# Patient Record
Sex: Female | Born: 1970
Health system: Southern US, Community
[De-identification: ages and names within clinical notes are randomized; demographics above are authoritative.]

## PROBLEM LIST (undated history)

## (undated) DIAGNOSIS — Z9889 Other specified postprocedural states: Secondary | ICD-10-CM

## (undated) DIAGNOSIS — Z86010 Personal history of colonic polyps: Principal | ICD-10-CM

## (undated) DIAGNOSIS — E785 Hyperlipidemia, unspecified: Secondary | ICD-10-CM

## (undated) DIAGNOSIS — M810 Age-related osteoporosis without current pathological fracture: Secondary | ICD-10-CM

## (undated) DIAGNOSIS — Z5189 Encounter for other specified aftercare: Secondary | ICD-10-CM

## (undated) DIAGNOSIS — D649 Anemia, unspecified: Secondary | ICD-10-CM

## (undated) DIAGNOSIS — M4854XS Collapsed vertebra, not elsewhere classified, thoracic region, sequela of fracture: Secondary | ICD-10-CM

## (undated) DIAGNOSIS — T8859XA Other complications of anesthesia, initial encounter: Secondary | ICD-10-CM

## (undated) DIAGNOSIS — F419 Anxiety disorder, unspecified: Secondary | ICD-10-CM

## (undated) DIAGNOSIS — N814 Uterovaginal prolapse, unspecified: Secondary | ICD-10-CM

## (undated) DIAGNOSIS — M419 Scoliosis, unspecified: Secondary | ICD-10-CM

## (undated) DIAGNOSIS — G4730 Sleep apnea, unspecified: Secondary | ICD-10-CM

## (undated) DIAGNOSIS — M1990 Unspecified osteoarthritis, unspecified site: Secondary | ICD-10-CM

## (undated) DIAGNOSIS — R1310 Dysphagia, unspecified: Secondary | ICD-10-CM

## (undated) DIAGNOSIS — R112 Nausea with vomiting, unspecified: Secondary | ICD-10-CM

## (undated) DIAGNOSIS — T7840XA Allergy, unspecified, initial encounter: Secondary | ICD-10-CM

## (undated) HISTORY — DX: Collapsed vertebra, not elsewhere classified, thoracic region, sequela of fracture: M48.54XS

## (undated) HISTORY — PX: PARTIAL HYSTERECTOMY: SHX80

## (undated) HISTORY — DX: Uterovaginal prolapse, unspecified: N81.4

## (undated) HISTORY — DX: Unspecified osteoarthritis, unspecified site: M19.90

## (undated) HISTORY — DX: Anxiety disorder, unspecified: F41.9

## (undated) HISTORY — PX: ABDOMINAL HYSTERECTOMY: SHX81

## (undated) HISTORY — DX: Sleep apnea, unspecified: G47.30

## (undated) HISTORY — PX: ESOPHAGOGASTRODUODENOSCOPY: SHX1529

## (undated) HISTORY — DX: Personal history of colonic polyps: Z86.010

## (undated) HISTORY — DX: Hyperlipidemia, unspecified: E78.5

## (undated) HISTORY — PX: OTHER SURGICAL HISTORY: SHX169

## (undated) HISTORY — DX: Scoliosis, unspecified: M41.9

## (undated) HISTORY — DX: Allergy, unspecified, initial encounter: T78.40XA

## (undated) HISTORY — DX: Age-related osteoporosis without current pathological fracture: M81.0

## (undated) HISTORY — DX: Dysphagia, unspecified: R13.10

## (undated) HISTORY — PX: COLONOSCOPY: SHX174

## (undated) HISTORY — DX: Encounter for other specified aftercare: Z51.89

---

## 2003-10-03 ENCOUNTER — Emergency Department (HOSPITAL_COMMUNITY): Admission: EM | Admit: 2003-10-03 | Discharge: 2003-10-03 | Payer: Self-pay | Admitting: Family Medicine

## 2013-06-28 DIAGNOSIS — Z860100 Personal history of colon polyps, unspecified: Secondary | ICD-10-CM

## 2013-06-28 HISTORY — DX: Personal history of colonic polyps: Z86.010

## 2013-06-28 HISTORY — DX: Personal history of colon polyps, unspecified: Z86.0100

## 2018-03-25 DIAGNOSIS — N951 Menopausal and female climacteric states: Secondary | ICD-10-CM | POA: Insufficient documentation

## 2018-03-25 DIAGNOSIS — G8929 Other chronic pain: Secondary | ICD-10-CM | POA: Diagnosis not present

## 2018-03-25 DIAGNOSIS — Z205 Contact with and (suspected) exposure to viral hepatitis: Secondary | ICD-10-CM | POA: Diagnosis not present

## 2018-03-25 DIAGNOSIS — N644 Mastodynia: Secondary | ICD-10-CM | POA: Diagnosis not present

## 2018-03-25 DIAGNOSIS — R351 Nocturia: Secondary | ICD-10-CM | POA: Diagnosis not present

## 2018-03-25 DIAGNOSIS — R1032 Left lower quadrant pain: Secondary | ICD-10-CM | POA: Diagnosis not present

## 2018-03-25 DIAGNOSIS — Z1159 Encounter for screening for other viral diseases: Secondary | ICD-10-CM | POA: Diagnosis not present

## 2018-03-25 DIAGNOSIS — R52 Pain, unspecified: Secondary | ICD-10-CM | POA: Insufficient documentation

## 2018-04-23 ENCOUNTER — Ambulatory Visit (INDEPENDENT_AMBULATORY_CARE_PROVIDER_SITE_OTHER): Payer: BLUE CROSS/BLUE SHIELD | Admitting: Family Medicine

## 2018-04-23 ENCOUNTER — Encounter: Payer: Self-pay | Admitting: Family Medicine

## 2018-04-23 VITALS — BP 118/56 | HR 86 | Temp 98.0°F | Ht 69.0 in | Wt 162.8 lb

## 2018-04-23 DIAGNOSIS — F419 Anxiety disorder, unspecified: Secondary | ICD-10-CM

## 2018-04-23 DIAGNOSIS — Z1211 Encounter for screening for malignant neoplasm of colon: Secondary | ICD-10-CM

## 2018-04-23 DIAGNOSIS — R7301 Impaired fasting glucose: Secondary | ICD-10-CM | POA: Diagnosis not present

## 2018-04-23 DIAGNOSIS — Z8739 Personal history of other diseases of the musculoskeletal system and connective tissue: Secondary | ICD-10-CM | POA: Diagnosis not present

## 2018-04-23 DIAGNOSIS — R079 Chest pain, unspecified: Secondary | ICD-10-CM

## 2018-04-23 DIAGNOSIS — Z1283 Encounter for screening for malignant neoplasm of skin: Secondary | ICD-10-CM

## 2018-04-23 DIAGNOSIS — R109 Unspecified abdominal pain: Secondary | ICD-10-CM

## 2018-04-23 DIAGNOSIS — Z7689 Persons encountering health services in other specified circumstances: Secondary | ICD-10-CM | POA: Diagnosis not present

## 2018-04-23 DIAGNOSIS — M7918 Myalgia, other site: Secondary | ICD-10-CM

## 2018-04-23 LAB — HEPATIC FUNCTION PANEL
ALT: 14 U/L (ref 0–35)
AST: 16 U/L (ref 0–37)
Albumin: 4.4 g/dL (ref 3.5–5.2)
Alkaline Phosphatase: 85 U/L (ref 39–117)
Bilirubin, Direct: 0 mg/dL (ref 0.0–0.3)
Total Bilirubin: 0.5 mg/dL (ref 0.2–1.2)
Total Protein: 7.5 g/dL (ref 6.0–8.3)

## 2018-04-23 LAB — LIPID PANEL
CHOL/HDL RATIO: 7
Cholesterol: 305 mg/dL — ABNORMAL HIGH (ref 0–200)
HDL: 41.5 mg/dL (ref >39.00)
Triglycerides: 419 mg/dL — ABNORMAL HIGH (ref 0.0–149.0)

## 2018-04-23 LAB — LIPASE: Lipase: 7 U/L — ABNORMAL LOW (ref 11.0–59.0)

## 2018-04-23 LAB — LDL CHOLESTEROL, DIRECT: Direct LDL: 195 mg/dL

## 2018-04-23 MED ORDER — ESCITALOPRAM OXALATE 10 MG PO TABS: 10.0000 mg | ORAL_TABLET | Freq: Every day | ORAL | 3 refills | Status: DC

## 2018-04-23 MED ORDER — CYCLOBENZAPRINE HCL 10 MG PO TABS: 10.0000 mg | ORAL_TABLET | Freq: Three times a day (TID) | ORAL | 0 refills | Status: DC | PRN

## 2018-04-23 NOTE — Progress Notes (Signed)
Roberta Sims is a 48 y.o. female  Chief Complaint  Patient presents with  . Establish Care    est care/ prediabetes concer?/ left side pain happened saturday scoliosis at 15 harrington rod-- fasting/ letting me know last tdap/ wants to wait to get Flu shot    HPI: Roberta Sims is a 48 y.o. female here to establish care with our office. She is from here originally but lived in [place] for 20+ years and moved back area 1.5 years ago. She has grown children still living in [place]. She is concerned about "pre-diabetes" based on labs done in 02/2018. Fasting BS in 02/2018 = 94 and A1C = 6.2.  Specialists: Dr. Danton Sewer (GYN), she has appt with neurologist but not until 07/30/2018 and would   Last PAP: 2015 and normal as per pt - pt is s/p partial hysterectomy; pt has appt for annual exam in 05/2018. Last mammo: has upcoming mammo and [place] Pt had colonoscopy and EGD in 06/2013. Repeat EGD recommended in 3 years d/t polyps (number and size) and colo in 5 years. She needs a referral for EGD/colonsocopy. + fam h/o CRC - mother Med refills needed today: n/a  She requests referral to derm for routine skin cancer screening.  She is fasting and would like lipid panel checked along with liver function and pancreas.   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 She felt it was very helpful despite initially being resistant to taking med. She states for the past few months she has started to feel similar "anxiety" symptoms - mind racing, trouble falling asleep or getting back to sleep. She would like to restart lexapro.   Pt notes a h/o TMJ and saw sleep med and was diagnosed with mild sleep apnea. She has CPAP but hasn't used in sometime. She may need new tubing but otherwise is set. She notes feeling "run down" and like she needs some "sleep in her body"  Past Medical History:  Diagnosis Date  . Scoliosis     Past  Surgical History:  Procedure Laterality Date  . harrington rod--scoliosis      Social History   Socioeconomic History  . Marital status: Single    Spouse name: Not on file  . Number of children: Not on file  . Years of education: Not on file  . Highest education level: Not on file  Occupational History  . Not on file  Social Needs  . Financial resource strain: Not on file  . Food insecurity:    Worry: Not on file    Inability: Not on file  . Transportation needs:    Medical: Not on file    Non-medical: Not on file  Tobacco Use  . Smoking status: Never Smoker  . Smokeless tobacco: Never Used  Substance and Sexual Activity  . Alcohol use: Yes    Comment: socially  . Drug use: Never  . Sexual activity: Not on file  Lifestyle  . Physical activity:    Days per week: Not on file    Minutes per session: Not on file  . Stress: Not on file  Relationships  . Social connections:    Talks on phone: Not on file    Gets together: Not on file    Attends religious service: Not on file    Active member of club or organization: Not on file  Attends meetings of clubs or organizations: Not on file    Relationship status: Not on file  . Intimate partner violence:    Fear of current or ex partner: Not on file    Emotionally abused: Not on file    Physically abused: Not on file    Forced sexual activity: Not on file  Other Topics Concern  . Not on file  Social History Narrative  . Not on file    Family History  Problem Relation Age of Onset  . Pancreatic cancer Mother   . Colon cancer Mother   . Alcohol abuse Brother   . Diabetes Brother   . Colon cancer Maternal Grandmother       There is no immunization history on file for this patient.  Outpatient Encounter Medications as of 04/23/2018  Medication Sig  . cyclobenzaprine (FLEXERIL) 10 MG tablet Take 1 tablet (10 mg total) by mouth 3 (three) times daily as needed for muscle spasms.  Marland Kitchen escitalopram (LEXAPRO) 10 MG  tablet Take 1 tablet (10 mg total) by mouth daily.  . pregabalin (LYRICA) 25 MG capsule Take by mouth.   No facility-administered encounter medications on file as of 04/23/2018.      ROS: Pertinent positives and negatives noted in HPI. Remainder of ROS non-contributory   No Known Allergies  BP (!) 118/56   Pulse 86   Temp 98 F (36.7 C) (Oral)   Ht  (1.753 m)   Wt 162 lb 12.8 oz (73.8 kg)   SpO2 95%   BMI 24.04 kg/m   Physical Exam  Constitutional: She is oriented to person, place, and time. She appears well-developed and well-nourished. No distress.  Neck: No thyromegaly present.  Cardiovascular: Normal rate and regular rhythm.  Pulmonary/Chest: Effort normal and breath sounds normal. No respiratory distress.  Abdominal: Soft. Bowel sounds are normal. She exhibits no distension (Lt upper lateral abdomen/side TTP) and no mass. There is abdominal tenderness. There is no rebound and no guarding.  Musculoskeletal:        General: No edema.  Lymphadenopathy:    She has no cervical adenopathy.  Neurological: She is alert and oriented to person, place, and time.  Psychiatric: She has a normal mood and affect. Her behavior is normal.     A/P:  1. Encounter to establish care with new doctor  2. Impaired fasting glucose - A1C = 6.2 in 1/20 - pt does endorse gaining 15+ lbs in past 1.5 years and some mild-moderate alcohol intake (3-4 glasses per week). Pt plans to increase exercise and improve diet over next 6 mo and will recheck A1C in 6 mo at CPE appt - Lipid panel  3. H/O scoliosis 4. Left-sided chest pain - chronic, extensive work-up done. Pt was told to see neuro to see if rod in in spine for scoliosis is causing/contributing to this - Ambulatory referral to Neurology  5. Left sided abdominal pain - chronic, extensive work-up done. Pt was told to see neuro to see if rod in in spine for scoliosis is causing/contributing to this - Ambulatory referral to Neurology -  Hepatic function panel - Lipase  6. Screening for colon cancer - Ambulatory referral to Gastroenterology  7. Screening for skin cancer - Ambulatory referral to Dermatology  8. Anxiety - h/o anxiety and previously treated in 2014, off meds and symptoms controlled x years but uncontrolled x 5-6 mo Rx: - escitalopram (LEXAPRO) 10 MG tablet; Take 1 tablet (10 mg total) by mouth daily.  Dispense: 90 tablet; Refill: 3 - f/u in 3-4 wks or sooner PRN  9. Musculoskeletal pain - left side x 1 week after bending forward to pick something up off floor - ibuprofen 600-800mg  BID w food x 7 days - heating pad BID - ok to take flexeril 5-10mg  qHS PRN  - f/u PRN   I spent 50 min of face-to-face timewith the patient today and greater than 50% was spent in counseling, coordination of care, education regarding anxiety, IFG,and preventative screening measures

## 2018-04-24 ENCOUNTER — Other Ambulatory Visit: Payer: Self-pay | Admitting: Family Medicine

## 2018-04-24 ENCOUNTER — Encounter: Payer: Self-pay | Admitting: Family Medicine

## 2018-04-24 DIAGNOSIS — E7849 Other hyperlipidemia: Secondary | ICD-10-CM | POA: Insufficient documentation

## 2018-04-24 DIAGNOSIS — E781 Pure hyperglyceridemia: Secondary | ICD-10-CM | POA: Insufficient documentation

## 2018-04-24 MED ORDER — FENOFIBRATE 145 MG PO TABS: 145.0000 mg | ORAL_TABLET | Freq: Every day | ORAL | 3 refills | Status: DC

## 2018-04-27 DIAGNOSIS — L608 Other nail disorders: Secondary | ICD-10-CM | POA: Diagnosis not present

## 2018-04-27 DIAGNOSIS — L570 Actinic keratosis: Secondary | ICD-10-CM | POA: Diagnosis not present

## 2018-04-27 DIAGNOSIS — L814 Other melanin hyperpigmentation: Secondary | ICD-10-CM | POA: Diagnosis not present

## 2018-04-27 DIAGNOSIS — L568 Other specified acute skin changes due to ultraviolet radiation: Secondary | ICD-10-CM | POA: Diagnosis not present

## 2018-05-05 DIAGNOSIS — N644 Mastodynia: Secondary | ICD-10-CM | POA: Diagnosis not present

## 2018-05-05 DIAGNOSIS — R928 Other abnormal and inconclusive findings on diagnostic imaging of breast: Secondary | ICD-10-CM | POA: Diagnosis not present

## 2018-05-05 DIAGNOSIS — N6320 Unspecified lump in the left breast, unspecified quadrant: Secondary | ICD-10-CM | POA: Diagnosis not present

## 2018-06-08 ENCOUNTER — Telehealth: Payer: Self-pay | Admitting: Internal Medicine

## 2018-06-08 DIAGNOSIS — Z86010 Personal history of colonic polyps: Secondary | ICD-10-CM

## 2018-06-08 NOTE — Telephone Encounter (Signed)
DOD April 23, 2018: Dr. Leone Payor, EGD and colonoscopy records from May 2015 were received from [facility] in [place], [place].  Pt is due for a 5-year recall.   Records will be placed on your desk for review.

## 2018-06-16 ENCOUNTER — Encounter: Payer: Self-pay | Admitting: Internal Medicine

## 2018-06-16 NOTE — Telephone Encounter (Signed)
Records review - history of sessile serrated colon polyp so repeat colonoscopy 5-10 years after makes sense 5 years is our current standard and recommend she be scheduled when we are able to start scheduling patinets for surveillance colonoscopy - not urgent as just at 5 years in 06/2018

## 2018-06-30 ENCOUNTER — Other Ambulatory Visit: Payer: Self-pay | Admitting: Family Medicine

## 2018-06-30 NOTE — Telephone Encounter (Signed)
Dr. Salena Saner please advise. Pt last OV and last refill 2/27 next OV scheduled for 7/29

## 2018-07-30 DIAGNOSIS — R52 Pain, unspecified: Secondary | ICD-10-CM | POA: Diagnosis not present

## 2018-07-30 DIAGNOSIS — M41125 Adolescent idiopathic scoliosis, thoracolumbar region: Secondary | ICD-10-CM | POA: Diagnosis not present

## 2018-08-12 DIAGNOSIS — R52 Pain, unspecified: Secondary | ICD-10-CM | POA: Diagnosis not present

## 2018-08-12 DIAGNOSIS — M47812 Spondylosis without myelopathy or radiculopathy, cervical region: Secondary | ICD-10-CM | POA: Diagnosis not present

## 2018-08-12 DIAGNOSIS — M47816 Spondylosis without myelopathy or radiculopathy, lumbar region: Secondary | ICD-10-CM | POA: Diagnosis not present

## 2018-08-12 DIAGNOSIS — M50222 Other cervical disc displacement at C5-C6 level: Secondary | ICD-10-CM | POA: Diagnosis not present

## 2018-08-12 DIAGNOSIS — M4802 Spinal stenosis, cervical region: Secondary | ICD-10-CM | POA: Diagnosis not present

## 2018-08-12 DIAGNOSIS — M47814 Spondylosis without myelopathy or radiculopathy, thoracic region: Secondary | ICD-10-CM | POA: Diagnosis not present

## 2018-08-24 DIAGNOSIS — N951 Menopausal and female climacteric states: Secondary | ICD-10-CM | POA: Diagnosis not present

## 2018-08-24 DIAGNOSIS — R52 Pain, unspecified: Secondary | ICD-10-CM | POA: Diagnosis not present

## 2018-08-24 DIAGNOSIS — Z205 Contact with and (suspected) exposure to viral hepatitis: Secondary | ICD-10-CM | POA: Diagnosis not present

## 2018-08-27 ENCOUNTER — Other Ambulatory Visit: Payer: Self-pay

## 2018-08-27 ENCOUNTER — Encounter: Payer: Self-pay | Admitting: Nurse Practitioner

## 2018-08-27 ENCOUNTER — Ambulatory Visit (INDEPENDENT_AMBULATORY_CARE_PROVIDER_SITE_OTHER): Payer: BC Managed Care – PPO | Admitting: Nurse Practitioner

## 2018-08-27 VITALS — Ht 69.0 in | Wt 156.0 lb

## 2018-08-27 DIAGNOSIS — J0100 Acute maxillary sinusitis, unspecified: Secondary | ICD-10-CM

## 2018-08-27 MED ORDER — MOMETASONE FUROATE 50 MCG/ACT NA SUSP: 2.0000 | Freq: Every day | NASAL | 0 refills | Status: DC

## 2018-08-27 MED ORDER — GUAIFENESIN ER 600 MG PO TB12: 600.0000 mg | ORAL_TABLET | Freq: Two times a day (BID) | ORAL | 0 refills | Status: DC | PRN

## 2018-08-27 MED ORDER — SALINE SPRAY 0.65 % NA SOLN: 1.0000 | NASAL | 0 refills | Status: DC | PRN

## 2018-08-27 MED ORDER — AMOXICILLIN 875 MG PO TABS: 875.0000 mg | ORAL_TABLET | Freq: Two times a day (BID) | ORAL | 0 refills | Status: DC

## 2018-08-27 NOTE — Patient Instructions (Signed)
Call office if no improvement in 5days.  Start oral abx, nasocort and mucinex Continue saline sinus rinse once a day.

## 2018-08-27 NOTE — Progress Notes (Signed)
Interactive audio and video telecommunications were attempted between this provider and patient, however failed, due to patient having technical difficulties OR patient did not have access to video capability.  We continued and completed visit with audio only.   Virtual Visit via Video Note  I connected with Roberta Sims on 08/27/18 at  8:15 AM EDT by a video enabled telemedicine application and verified that I am speaking with the correct person using two identifiers.  Location: Patient: Home Provider: office   I discussed the limitations of evaluation and management by telemedicine and the availability of in person appointments. The patient expressed understanding and agreed to proceed.  CC: pt is c/o of sinus pressure--headd hurt,jaw painful/1 wk/ tied zytect and sinus rinse/ hx of sinus allergy before.   History of Present Illness: Sinusitis This is a new problem. The current episode started 1 to 4 weeks ago. The problem has been gradually worsening since onset. There has been no fever. Associated symptoms include congestion, headaches and sinus pressure. Pertinent negatives include no chills, coughing, diaphoresis, ear pain, hoarse voice, neck pain, shortness of breath, sneezing, sore throat or swollen glands. Past treatments include saline sprays (and zyrtec). The treatment provided no relief.  negative COVID screen. No sick contact at home, she works from home, she observe social distacing and facial covering guidelines.  Observations/Objective: Alert and oriented, normal voice and speech.  Assessment and Plan: Roberta Sims was seen today for sinusitis.  Diagnoses and all orders for this visit:  Acute non-recurrent maxillary sinusitis -     amoxicillin (AMOXIL) 875 MG tablet; Take 1 tablet (875 mg total) by mouth 2 (two) times daily. -     mometasone (NASONEX) 50 MCG/ACT nasal spray; Place 2 sprays into the nose daily. -     guaiFENesin (MUCINEX) 600 MG 12 hr tablet; Take 1 tablet  (600 mg total) by mouth 2 (two) times daily as needed for cough or to loosen phlegm. -     sodium chloride (OCEAN) 0.65 % SOLN nasal spray; Place 1 spray into both nostrils as needed for congestion.   Follow Up Instructions: See discharge instruction   I discussed the assessment and treatment plan with the patient. The patient was provided an opportunity to ask questions and all were answered. The patient agreed with the plan and demonstrated an understanding of the instructions.   The patient was advised to call back or seek an in-person evaluation if the symptoms worsen or if the condition fails to improve as anticipated.  I provided 14 minutes of non-face-to-face time during this encounter.   Wilfred Lacy, NP

## 2018-09-01 NOTE — Telephone Encounter (Signed)
Left message to call back to schedule direct colon.  

## 2018-09-11 ENCOUNTER — Other Ambulatory Visit: Payer: Self-pay

## 2018-09-11 ENCOUNTER — Encounter: Payer: Self-pay | Admitting: Family Medicine

## 2018-09-11 ENCOUNTER — Ambulatory Visit (INDEPENDENT_AMBULATORY_CARE_PROVIDER_SITE_OTHER): Payer: BC Managed Care – PPO | Admitting: Family Medicine

## 2018-09-11 VITALS — BP 130/90 | HR 88 | Temp 98.1°F

## 2018-09-11 DIAGNOSIS — R6883 Chills (without fever): Secondary | ICD-10-CM | POA: Diagnosis not present

## 2018-09-11 DIAGNOSIS — R6889 Other general symptoms and signs: Secondary | ICD-10-CM

## 2018-09-11 DIAGNOSIS — Z20822 Contact with and (suspected) exposure to covid-19: Secondary | ICD-10-CM

## 2018-09-11 DIAGNOSIS — R3915 Urgency of urination: Secondary | ICD-10-CM | POA: Diagnosis not present

## 2018-09-11 DIAGNOSIS — R195 Other fecal abnormalities: Secondary | ICD-10-CM

## 2018-09-11 DIAGNOSIS — R110 Nausea: Secondary | ICD-10-CM

## 2018-09-11 LAB — POCT URINALYSIS DIPSTICK
Bilirubin, UA: NEGATIVE
Blood, UA: NEGATIVE
Glucose, UA: NEGATIVE
Ketones, UA: NEGATIVE
Leukocytes, UA: NEGATIVE
Nitrite, UA: NEGATIVE
Protein, UA: POSITIVE — AB
Spec Grav, UA: 1.015 (ref 1.010–1.025)
Urobilinogen, UA: 1 E.U./dL
pH, UA: 8.5 — AB (ref 5.0–8.0)

## 2018-09-11 MED ORDER — ONDANSETRON 4 MG PO TBDP: 4.0000 mg | ORAL_TABLET | Freq: Three times a day (TID) | ORAL | 0 refills | Status: DC | PRN

## 2018-09-11 NOTE — Progress Notes (Signed)
Roberta Sims is a 48 y.o. female  Chief Complaint  Patient presents with   Urinary Frequency    frequent urination/burning with urination/lower left side flank pain/ 7 days    HPI: Roberta Sims is a 48 y.o. female complains of 7 day h/o urinary frequency, Lt lower back pain. She has a sensation of incomplete emptying. Mild dysuria x 1 episodes this afternoon. + muscle aches. + nausea, no vomiting x today. + chills today. +  Increased reflux. She endorses loose stools and at times diarrhea x 7 days.  No gross hematuria. No fever. No cough, SOB, sore throat, runny nose, nasal congestion. She has has been drinking more water.  Partner is in mid 29's. He is supposed to be going to [place] to care for his sister who just underwent TAH for ovarian cancer.  Past Medical History:  Diagnosis Date   Hx of colonic polyp 06/28/2013   Scoliosis     Past Surgical History:  Procedure Laterality Date   harrington rod--scoliosis      Social History   Socioeconomic History   Marital status: Single    Spouse name: Not on file   Number of children: Not on file   Years of education: Not on file   Highest education level: Not on file  Occupational History   Not on file  Social Needs   Financial resource strain: Not on file   Food insecurity    Worry: Not on file    Inability: Not on file   Transportation needs    Medical: Not on file    Non-medical: Not on file  Tobacco Use   Smoking status: Never Smoker   Smokeless tobacco: Never Used  Substance and Sexual Activity   Alcohol use: Yes    Comment: socially   Drug use: Never   Sexual activity: Not on file  Lifestyle   Physical activity    Days per week: Not on file    Minutes per session: Not on file   Stress: Not on file  Relationships   Social connections    Talks on phone: Not on file    Gets together: Not on file    Attends religious service: Not on file    Active member of club or organization: Not on file      Attends meetings of clubs or organizations: Not on file    Relationship status: Not on file   Intimate partner violence    Fear of current or ex partner: Not on file    Emotionally abused: Not on file    Physically abused: Not on file    Forced sexual activity: Not on file  Other Topics Concern   Not on file  Social History Narrative   Not on file    Family History  Problem Relation Age of Onset   Pancreatic cancer Mother    Colon cancer Mother    Alcohol abuse Brother    Diabetes Brother    Colon cancer Maternal Grandmother       There is no immunization history on file for this patient.  Outpatient Encounter Medications as of 09/11/2018  Medication Sig   Cetirizine HCl (ZYRTEC PO) Take by mouth.   cyclobenzaprine (FLEXERIL) 10 MG tablet TAKE ONE TABLET BY MOUTH THREE TIMES A DAY AS NEEDED FOR MUSCLE SPASM   escitalopram (LEXAPRO) 10 MG tablet Take 1 tablet (10 mg total) by mouth daily.   fenofibrate (TRICOR) 145 MG tablet Take 1 tablet (145 mg  total) by mouth daily.   sodium chloride (OCEAN) 0.65 % SOLN nasal spray Place 1 spray into both nostrils as needed for congestion.   guaiFENesin (MUCINEX) 600 MG 12 hr tablet Take 1 tablet (600 mg total) by mouth 2 (two) times daily as needed for cough or to loosen phlegm. (Patient not taking: Reported on 09/11/2018)   mometasone (NASONEX) 50 MCG/ACT nasal spray Place 2 sprays into the nose daily. (Patient not taking: Reported on 09/11/2018)   pregabalin (LYRICA) 25 MG capsule Take by mouth.   [DISCONTINUED] amoxicillin (AMOXIL) 875 MG tablet Take 1 tablet (875 mg total) by mouth 2 (two) times daily.   No facility-administered encounter medications on file as of 09/11/2018.      ROS: Pertinent positives and negatives noted in HPI. Remainder of ROS non-contributory    No Known Allergies  BP 130/90    Pulse 88    Temp 98.1 F (36.7 C) (Oral)    SpO2 98%   Physical Exam  Constitutional: She is oriented to  person, place, and time. She appears well-developed and well-nourished.  Neck: Neck supple.  Cardiovascular: Normal rate, regular rhythm and normal heart sounds.  Pulmonary/Chest: Effort normal and breath sounds normal. No respiratory distress. She has no wheezes. She has no rhonchi.  Abdominal: Soft. Bowel sounds are increased. There is no abdominal tenderness. There is no CVA tenderness.  Musculoskeletal:        General: Tenderness (B/L lumbar TTP but no CVA TTP) present. No edema.  Lymphadenopathy:    She has no cervical adenopathy.  Neurological: She is alert and oriented to person, place, and time.  Skin: Skin is warm and dry. No rash noted.    Component     Latest Ref Rng & Units 09/11/2018  Color, UA      yellow  Clarity, UA      clear  Glucose     Negative Negative  Bilirubin, UA      neg  Ketones, UA      neg  Specific Gravity, UA     1.010 - 1.025 1.015  RBC, UA      neg  pH, UA     5.0 - 8.0 8.5 (A)  Protein,UA     Negative Positive (A)  Urobilinogen, UA     0.2 or 1.0 E.U./dL 1.0  Nitrite, UA      neg  Leukocytes,UA     Negative Negative     A/P:   1. Urgency of urination - POCT Urinalysis Dipstick - neg for leuk, nitrite, blood - no CVA TTP  2. Nausea 3. Chills 4. Loose stools 5. Suspected Covid-19 Virus Infection - doubt UTI, pyelonephritis  - could be viral gastroenteritis but symptoms also concerning for COVID Rx: - ondansetron (ZOFRAN ODT) 4 MG disintegrating tablet; Take 1 tablet (4 mg total) by mouth every 8 (eight) hours as needed for nausea or vomiting.  Dispense: 20 tablet; Refill: 0 - Novel Coronavirus, NAA ([facility]) - MYCHART COVID-19 HOME MONITORING PROGRAM - pt will quarantine at home and understands guidelines - increased fluid intake, rest, tylenol PRN, bland diet Discussed plan and reviewed medications with patient, including risks, benefits, and potential side effects. Pt expressed understand. All questions answered.

## 2018-09-11 NOTE — Patient Instructions (Addendum)
[place]  8am COVID testing site

## 2018-09-14 ENCOUNTER — Other Ambulatory Visit: Payer: Self-pay | Admitting: Family Medicine

## 2018-09-14 DIAGNOSIS — Z20822 Contact with and (suspected) exposure to covid-19: Secondary | ICD-10-CM

## 2018-09-14 DIAGNOSIS — R6889 Other general symptoms and signs: Secondary | ICD-10-CM | POA: Diagnosis not present

## 2018-09-16 ENCOUNTER — Encounter: Payer: Self-pay | Admitting: Family Medicine

## 2018-09-17 LAB — NOVEL CORONAVIRUS, NAA: SARS-CoV-2, NAA: NOT DETECTED

## 2018-09-21 ENCOUNTER — Telehealth: Payer: Self-pay | Admitting: Family Medicine

## 2018-09-21 NOTE — Telephone Encounter (Signed)
Pt advised of negative results w/t understanding

## 2018-09-23 ENCOUNTER — Ambulatory Visit: Payer: BLUE CROSS/BLUE SHIELD | Admitting: Family Medicine

## 2018-10-29 ENCOUNTER — Encounter: Payer: Self-pay | Admitting: Family Medicine

## 2018-10-29 ENCOUNTER — Ambulatory Visit: Payer: BC Managed Care – PPO | Admitting: Family Medicine

## 2018-10-29 ENCOUNTER — Ambulatory Visit (INDEPENDENT_AMBULATORY_CARE_PROVIDER_SITE_OTHER): Payer: BC Managed Care – PPO | Admitting: Family Medicine

## 2018-10-29 VITALS — BP 112/80 | HR 80 | Temp 98.1°F | Ht 69.0 in | Wt 158.2 lb

## 2018-10-29 DIAGNOSIS — Z0000 Encounter for general adult medical examination without abnormal findings: Secondary | ICD-10-CM

## 2018-10-29 DIAGNOSIS — Z23 Encounter for immunization: Secondary | ICD-10-CM | POA: Diagnosis not present

## 2018-10-29 DIAGNOSIS — G8929 Other chronic pain: Secondary | ICD-10-CM

## 2018-10-29 DIAGNOSIS — F419 Anxiety disorder, unspecified: Secondary | ICD-10-CM | POA: Diagnosis not present

## 2018-10-29 DIAGNOSIS — E781 Pure hyperglyceridemia: Secondary | ICD-10-CM

## 2018-10-29 DIAGNOSIS — M25562 Pain in left knee: Secondary | ICD-10-CM

## 2018-10-29 DIAGNOSIS — Z2821 Immunization not carried out because of patient refusal: Secondary | ICD-10-CM

## 2018-10-29 DIAGNOSIS — R7301 Impaired fasting glucose: Secondary | ICD-10-CM | POA: Diagnosis not present

## 2018-10-29 DIAGNOSIS — M25561 Pain in right knee: Secondary | ICD-10-CM

## 2018-10-29 LAB — LDL CHOLESTEROL, DIRECT: Direct LDL: 215 mg/dL

## 2018-10-29 LAB — LIPID PANEL
Cholesterol: 281 mg/dL — ABNORMAL HIGH (ref 0–200)
HDL: 44.2 mg/dL (ref >39.00)
NonHDL: 236.3
Total CHOL/HDL Ratio: 6
Triglycerides: 243 mg/dL — ABNORMAL HIGH (ref 0.0–149.0)
VLDL: 48.6 mg/dL — ABNORMAL HIGH (ref 0.0–40.0)

## 2018-10-29 LAB — BASIC METABOLIC PANEL
BUN: 7 mg/dL (ref 6–23)
CO2: 28 mEq/L (ref 19–32)
Calcium: 9.4 mg/dL (ref 8.4–10.5)
Chloride: 101 mEq/L (ref 96–112)
Creatinine, Ser: 0.63 mg/dL (ref 0.40–1.20)
GFR: 99.9 mL/min (ref >60.00)
Glucose, Bld: 99 mg/dL (ref 70–99)
Potassium: 4 mEq/L (ref 3.5–5.1)
Sodium: 136 mEq/L (ref 135–145)

## 2018-10-29 LAB — HEMOGLOBIN A1C: Hgb A1c MFr Bld: 5.8 % (ref 4.6–6.5)

## 2018-10-29 LAB — VITAMIN D 25 HYDROXY (VIT D DEFICIENCY, FRACTURES): VITD: 29.32 ng/mL — ABNORMAL LOW (ref 30.00–100.00)

## 2018-10-29 MED ORDER — CYCLOBENZAPRINE HCL 10 MG PO TABS: 10.0000 mg | ORAL_TABLET | Freq: Three times a day (TID) | ORAL | 1 refills | Status: DC | PRN

## 2018-10-29 NOTE — Patient Instructions (Signed)
Health Maintenance, Female Adopting a healthy lifestyle and getting preventive care are important in promoting health and wellness. Ask your health care provider about:  The right schedule for you to have regular tests and exams.  Things you can do on your own to prevent diseases and keep yourself healthy. What should I know about diet, weight, and exercise? Eat a healthy diet   Eat a diet that includes plenty of vegetables, fruits, low-fat dairy products, and lean protein.  Do not eat a lot of foods that are high in solid fats, added sugars, or sodium. Maintain a healthy weight Body mass index (BMI) is used to identify weight problems. It estimates body fat based on height and weight. Your health care provider can help determine your BMI and help you achieve or maintain a healthy weight. Get regular exercise Get regular exercise. This is one of the most important things you can do for your health. Most adults should:  Exercise for at least 150 minutes each week. The exercise should increase your heart rate and make you sweat (moderate-intensity exercise).  Do strengthening exercises at least twice a week. This is in addition to the moderate-intensity exercise.  Spend less time sitting. Even light physical activity can be beneficial. Watch cholesterol and blood lipids Have your blood tested for lipids and cholesterol at 50 years of age, then have this test every 5 years. Have your cholesterol levels checked more often if:  Your lipid or cholesterol levels are high.  You are older than 50 years of age.  You are at high risk for heart disease. What should I know about cancer screening? Depending on your health history and family history, you may need to have cancer screening at various ages. This may include screening for:  Breast cancer.  Cervical cancer.  Colorectal cancer.  Skin cancer.  Lung cancer. What should I know about heart disease, diabetes, and high blood  pressure? Blood pressure and heart disease  High blood pressure causes heart disease and increases the risk of stroke. This is more likely to develop in people who have high blood pressure readings, are of African descent, or are overweight.  Have your blood pressure checked: ? Every 3-5 years if you are 18-39 years of age. ? Every year if you are 40 years old or older. Diabetes Have regular diabetes screenings. This checks your fasting blood sugar level. Have the screening done:  Once every three years after age 40 if you are at a normal weight and have a low risk for diabetes.  More often and at a younger age if you are overweight or have a high risk for diabetes. What should I know about preventing infection? Hepatitis B If you have a higher risk for hepatitis B, you should be screened for this virus. Talk with your health care provider to find out if you are at risk for hepatitis B infection. Hepatitis C Testing is recommended for:  Everyone born from 1945 through 1965.  Anyone with known risk factors for hepatitis C. Sexually transmitted infections (STIs)  Get screened for STIs, including gonorrhea and chlamydia, if: ? You are sexually active and are younger than 50 years of age. ? You are older than 50 years of age and your health care provider tells you that you are at risk for this type of infection. ? Your sexual activity has changed since you were last screened, and you are at increased risk for chlamydia or gonorrhea. Ask your health care provider if   you are at risk.  Ask your health care provider about whether you are at high risk for HIV. Your health care provider may recommend a prescription medicine to help prevent HIV infection. If you choose to take medicine to prevent HIV, you should first get tested for HIV. You should then be tested every 3 months for as long as you are taking the medicine. Pregnancy  If you are about to stop having your period (premenopausal) and  you may become pregnant, seek counseling before you get pregnant.  Take 400 to 800 micrograms (mcg) of folic acid every day if you become pregnant.  Ask for birth control (contraception) if you want to prevent pregnancy. Osteoporosis and menopause Osteoporosis is a disease in which the bones lose minerals and strength with aging. This can result in bone fractures. If you are 65 years old or older, or if you are at risk for osteoporosis and fractures, ask your health care provider if you should:  Be screened for bone loss.  Take a calcium or vitamin D supplement to lower your risk of fractures.  Be given hormone replacement therapy (HRT) to treat symptoms of menopause. Follow these instructions at home: Lifestyle  Do not use any products that contain nicotine or tobacco, such as cigarettes, e-cigarettes, and chewing tobacco. If you need help quitting, ask your health care provider.  Do not use street drugs.  Do not share needles.  Ask your health care provider for help if you need support or information about quitting drugs. Alcohol use  Do not drink alcohol if: ? Your health care provider tells you not to drink. ? You are pregnant, may be pregnant, or are planning to become pregnant.  If you drink alcohol: ? Limit how much you use to 0-1 drink a day. ? Limit intake if you are breastfeeding.  Be aware of how much alcohol is in your drink. In the [place] one drink equals one 12 oz bottle of beer (355 mL), one 5 oz glass of wine (148 mL), or one 1 oz glass of hard liquor (44 mL). General instructions  Schedule regular health, dental, and eye exams.  Stay current with your vaccines.  Tell your health care provider if: ? You often feel depressed. ? You have ever been abused or do not feel safe at home. Summary  Adopting a healthy lifestyle and getting preventive care are important in promoting health and wellness.  Follow your health care provider's instructions about healthy  diet, exercising, and getting tested or screened for diseases.  Follow your health care provider's instructions on monitoring your cholesterol and blood pressure. This information is not intended to replace advice given to you by your health care provider. Make sure you discuss any questions you have with your health care provider. Document Released: 08/27/2010 Document Revised: 02/04/2018 Document Reviewed: 02/04/2018 Elsevier Patient Education  2020 [employer]  

## 2018-10-29 NOTE — Progress Notes (Signed)
Roberta Sims is a 48 y.o. female  Chief Complaint  Patient presents with  . Annual Exam    CPE- had couple bites of apple/wants tdap no flu shot/     HPI: Roberta Sims is a 48 y.o. female here for CPE, fasting labs. She would like Tdap vaccine. She declines flu shot.  She had FLP done in 03/2018, and due to elevated results, was started on fenofibrate.   Last PAP: 2015 and normal as per pt - pt is s/p partial hysterectomy; pt follows with GYN and was last seen in 07/2018 - no further PAPs needed per GYN note and next appt in 06/2019. Last mammo: UTD Pt had colonoscopy and EGD in 06/2013. Repeat EGD recommended in 3 years d/t polyps (number and size) and colo in 5 years. + fam h/o CRC - mother. Referral previously placed to GI but scheduling was delayed d/t COVID pandemic. Pt will call this week to schedule  Dental: scheduled in 12/2018 Vision: due - pt wears contacts  Diet/Exercise: eating healthier, drinking more water  Med refills needed today? Flexeril  Pt complains of chronic B/L Lt >Rt knee pain x years but worse in the past few weeks. Swelling comes and goes. Pain is always present but changes in intensity. Pain worse with up/down stairs. Pain under knee cap, Lt proximal knee. She wears a brace as needed. Pt states she dislocated her Lt knee as a teenager.   Past Medical History:  Diagnosis Date  . Hx of colonic polyp 06/28/2013  . Scoliosis     Past Surgical History:  Procedure Laterality Date  . harrington rod--scoliosis      Social History   Socioeconomic History  . Marital status: Single    Spouse name: Not on file  . Number of children: Not on file  . Years of education: Not on file  . Highest education level: Not on file  Occupational History  . Not on file  Social Needs  . Financial resource strain: Not on file  . Food insecurity    Worry: Not on file    Inability: Not on file  . Transportation needs    Medical: Not on file    Non-medical: Not on file   Tobacco Use  . Smoking status: Never Smoker  . Smokeless tobacco: Never Used  Substance and Sexual Activity  . Alcohol use: Yes    Comment: socially  . Drug use: Never  . Sexual activity: Not on file  Lifestyle  . Physical activity    Days per week: Not on file    Minutes per session: Not on file  . Stress: Not on file  Relationships  . Social Herbalist on phone: Not on file    Gets together: Not on file    Attends religious service: Not on file    Active member of club or organization: Not on file    Attends meetings of clubs or organizations: Not on file    Relationship status: Not on file  . Intimate partner violence    Fear of current or ex partner: Not on file    Emotionally abused: Not on file    Physically abused: Not on file    Forced sexual activity: Not on file  Other Topics Concern  . Not on file  Social History Narrative  . Not on file    Family History  Problem Relation Age of Onset  . Pancreatic cancer Mother   . Colon  cancer Mother   . Alcohol abuse Brother   . Diabetes Brother   . Colon cancer Maternal Grandmother      There is no immunization history for the selected administration types on file for this patient.  Outpatient Encounter Medications as of 10/29/2018  Medication Sig  . Cetirizine HCl (ZYRTEC PO) Take by mouth.  . cyclobenzaprine (FLEXERIL) 10 MG tablet Take 1 tablet (10 mg total) by mouth 3 (three) times daily as needed for muscle spasms.  . fenofibrate (TRICOR) 145 MG tablet Take 1 tablet (145 mg total) by mouth daily.  . sodium chloride (OCEAN) 0.65 % SOLN nasal spray Place 1 spray into both nostrils as needed for congestion.  . [DISCONTINUED] cyclobenzaprine (FLEXERIL) 10 MG tablet TAKE ONE TABLET BY MOUTH THREE TIMES A DAY AS NEEDED FOR MUSCLE SPASM  . escitalopram (LEXAPRO) 10 MG tablet Take 1 tablet (10 mg total) by mouth daily. (Patient not taking: Reported on 10/29/2018)  . pregabalin (LYRICA) 25 MG capsule Take by  mouth.  . [DISCONTINUED] guaiFENesin (MUCINEX) 600 MG 12 hr tablet Take 1 tablet (600 mg total) by mouth 2 (two) times daily as needed for cough or to loosen phlegm. (Patient not taking: Reported on 09/11/2018)  . [DISCONTINUED] mometasone (NASONEX) 50 MCG/ACT nasal spray Place 2 sprays into the nose daily. (Patient not taking: Reported on 09/11/2018)  . [DISCONTINUED] ondansetron (ZOFRAN ODT) 4 MG disintegrating tablet Take 1 tablet (4 mg total) by mouth every 8 (eight) hours as needed for nausea or vomiting. (Patient not taking: Reported on 10/29/2018)   No facility-administered encounter medications on file as of 10/29/2018.      ROS: Gen: no fever, chills  Skin: no rash, itching ENT: no ear pain, ear drainage, nasal congestion, rhinorrhea, sinus pressure, sore throat Eyes: no blurry vision, double vision Resp: no cough, wheeze,SOB CV: no CP, palpitations, LE edema,  GI: no heartburn, n/v/d/c, abd pain GU: no dysuria, urgency, frequency, hematuria MSK: Lt > Rt knee pain; pt with chronic back and Lt side pain x years Neuro: no dizziness, headache, weakness, vertigo Psych: no depression, anxiety, insomnia   No Known Allergies  BP 112/80   Pulse 80   Temp 98.1 F (36.7 C) (Oral)   Ht 5\' 9"  (1.753 m)   Wt 158 lb 3.2 oz (71.8 kg)   SpO2 98%   BMI 23.36 kg/m   Physical Exam  Constitutional: She is oriented to person, place, and time. She appears well-developed and well-nourished. No distress.  HENT:  Head: Normocephalic and atraumatic.  Right Ear: Tympanic membrane and ear canal normal.  Left Ear: Tympanic membrane and ear canal normal.  Nose: Nose normal.  Mouth/Throat: Oropharynx is clear and moist and mucous membranes are normal.  Eyes: Pupils are equal, round, and reactive to light. Conjunctivae are normal.  Neck: Neck supple. No thyromegaly present.  Cardiovascular: Normal rate, regular rhythm, normal heart sounds and intact distal pulses.  No murmur heard. Pulmonary/Chest:  Effort normal and breath sounds normal. No respiratory distress. She has no wheezes. She has no rhonchi.  Abdominal: Soft. Bowel sounds are normal. She exhibits no distension and no mass. There is no abdominal tenderness.  Musculoskeletal:        General: No edema.  Lymphadenopathy:    She has no cervical adenopathy.  Neurological: She is alert and oriented to person, place, and time. She exhibits normal muscle tone. Coordination normal.  Skin: Skin is warm and dry.  Psychiatric: She has a normal mood and affect.  Her behavior is normal.     A/P:  1. Annual physical exam - due for colo - pt will schedule - PAP and mammo UTD - dental appt scheduled, pt will schedule vision appt - discussed importance of regular CV exercise, healthy diet, adequate sleep - Lipid panel - VITAMIN D 25 Hydroxy (Vit-D Deficiency, Fractures) - Basic metabolic panel - next CPE in 1 year  2. Impaired fasting glucose - Hemoglobin A1c  3. Hypertriglyceridemia, familial - cont fenofibrate - Lipid panel  4. Anxiety - pt stopped lexapro about 3 weeks ago just to see how she would do, feels fine and will continue not to take at this time. She feels her anxiety is well-controlled at this time  5. Need for Tdap vaccination - Tdap vaccine greater than or equal to 7yo IM  6. Influenza vaccination declined by patient  7. Chronic pain of left knee 8. Bilateral chronic knee pain - DG Knee Complete 4 Views Left; Future - DG Knee Complete 4 Views Right; Future

## 2018-11-10 ENCOUNTER — Other Ambulatory Visit: Payer: BC Managed Care – PPO

## 2018-12-01 ENCOUNTER — Encounter: Payer: Self-pay | Admitting: Internal Medicine

## 2018-12-03 ENCOUNTER — Other Ambulatory Visit: Payer: Self-pay

## 2018-12-03 ENCOUNTER — Ambulatory Visit (INDEPENDENT_AMBULATORY_CARE_PROVIDER_SITE_OTHER): Payer: BC Managed Care – PPO

## 2018-12-03 ENCOUNTER — Other Ambulatory Visit: Payer: BC Managed Care – PPO

## 2018-12-03 DIAGNOSIS — G8929 Other chronic pain: Secondary | ICD-10-CM | POA: Diagnosis not present

## 2018-12-03 DIAGNOSIS — M25562 Pain in left knee: Secondary | ICD-10-CM

## 2018-12-03 DIAGNOSIS — M25861 Other specified joint disorders, right knee: Secondary | ICD-10-CM | POA: Diagnosis not present

## 2018-12-03 DIAGNOSIS — M25862 Other specified joint disorders, left knee: Secondary | ICD-10-CM | POA: Diagnosis not present

## 2018-12-03 DIAGNOSIS — M25561 Pain in right knee: Secondary | ICD-10-CM

## 2018-12-03 IMAGING — DX DG KNEE COMPLETE 4+V*R*
4 series · 4 of 4 positions shown · non-contrast
Comparison: None.

CLINICAL DATA: Chronic bilateral knee pain.

EXAM:
RIGHT KNEE - COMPLETE 4+ VIEW; LEFT KNEE - COMPLETE 4+ VIEW

[knee ap]
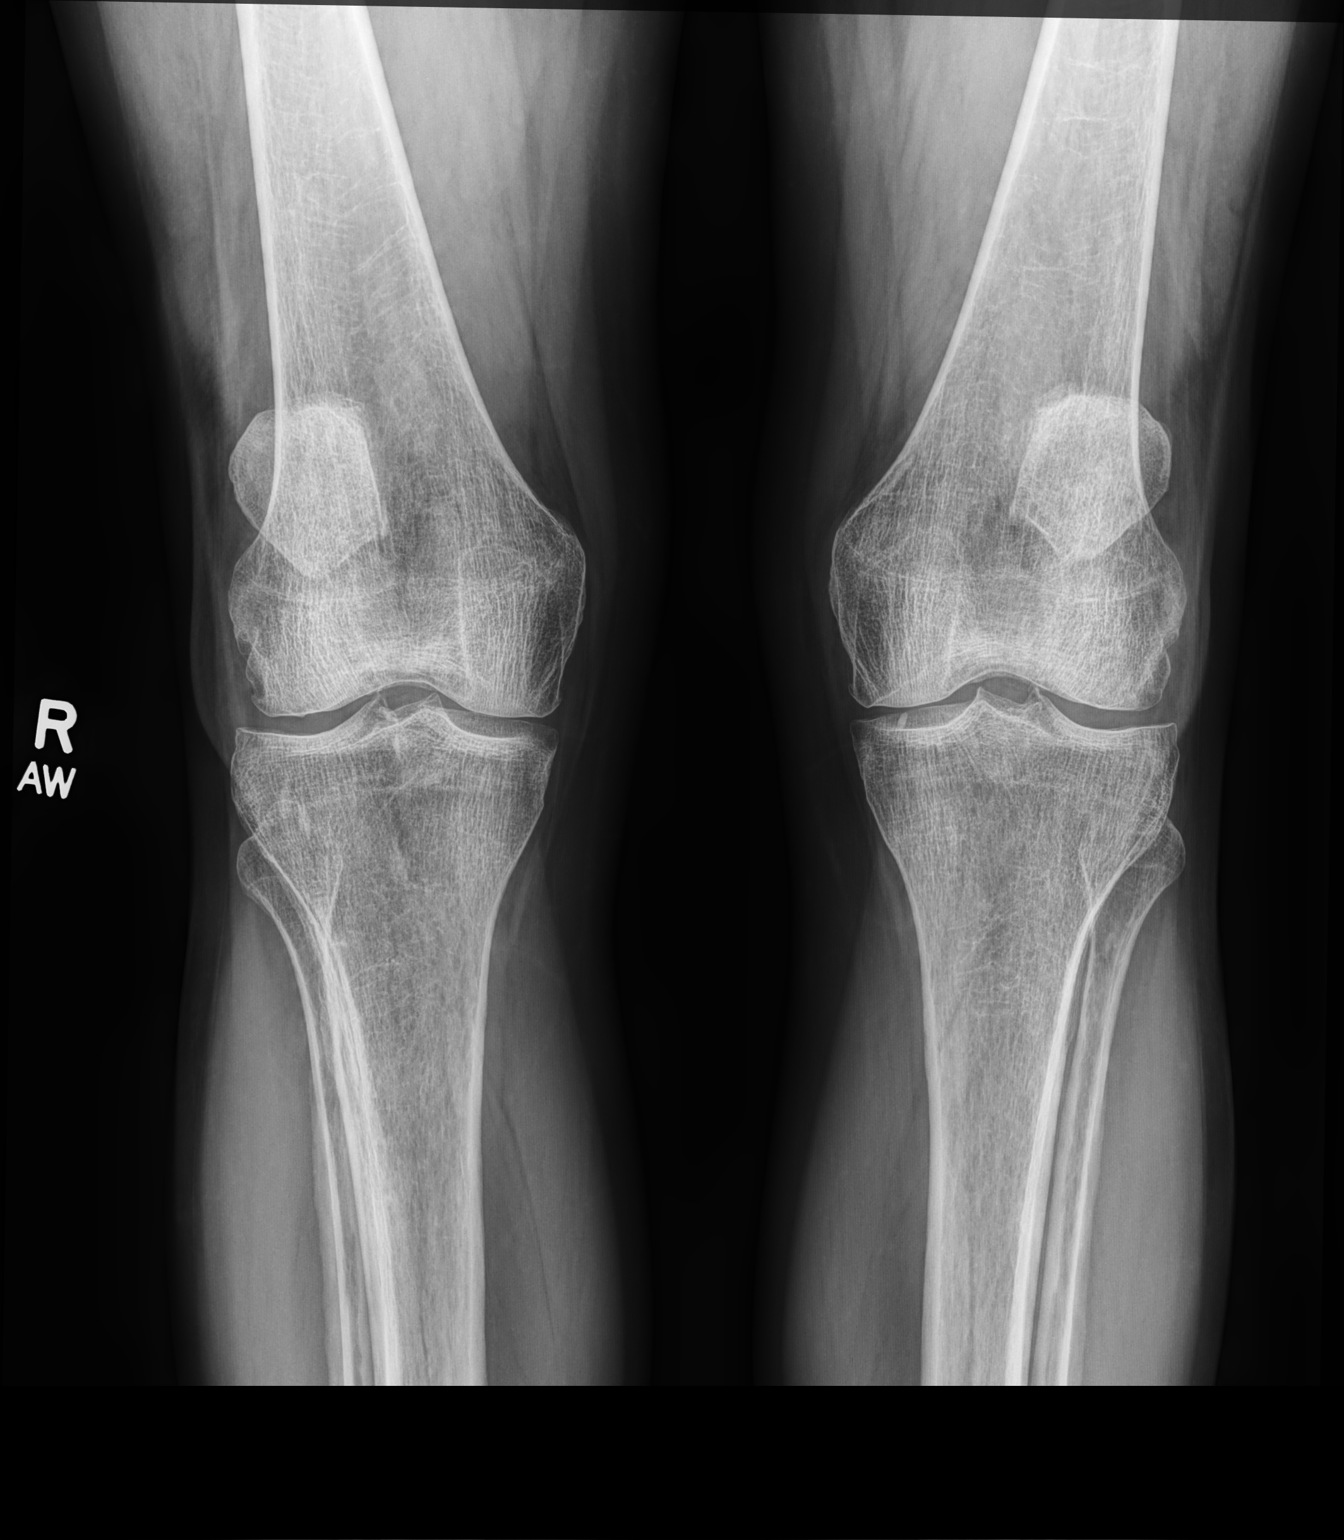

[knee [person_name] view pa]
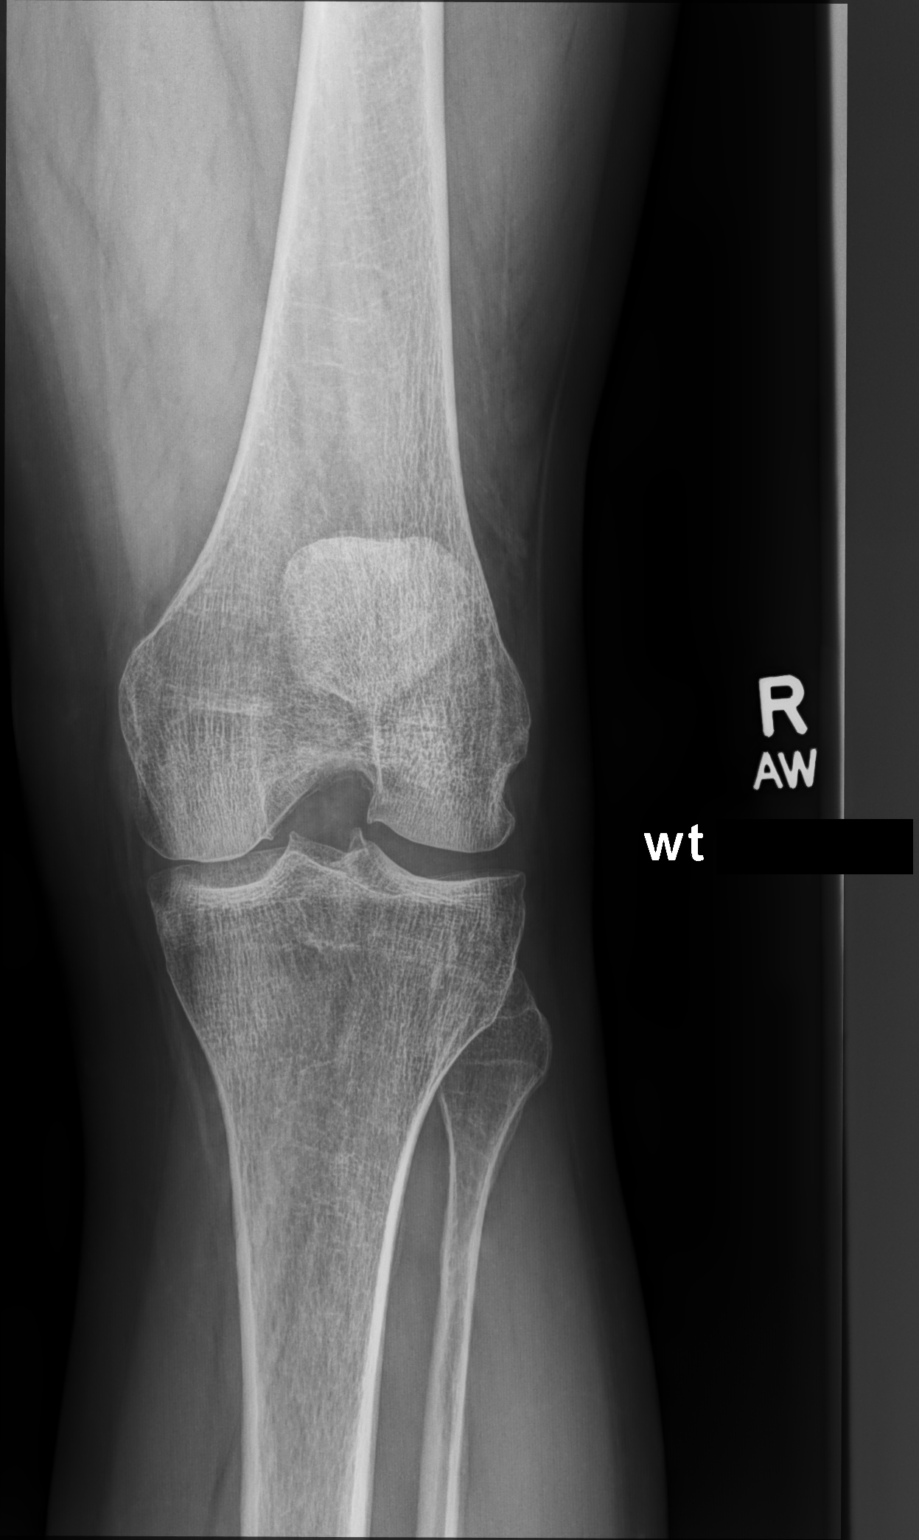

[knee lat]
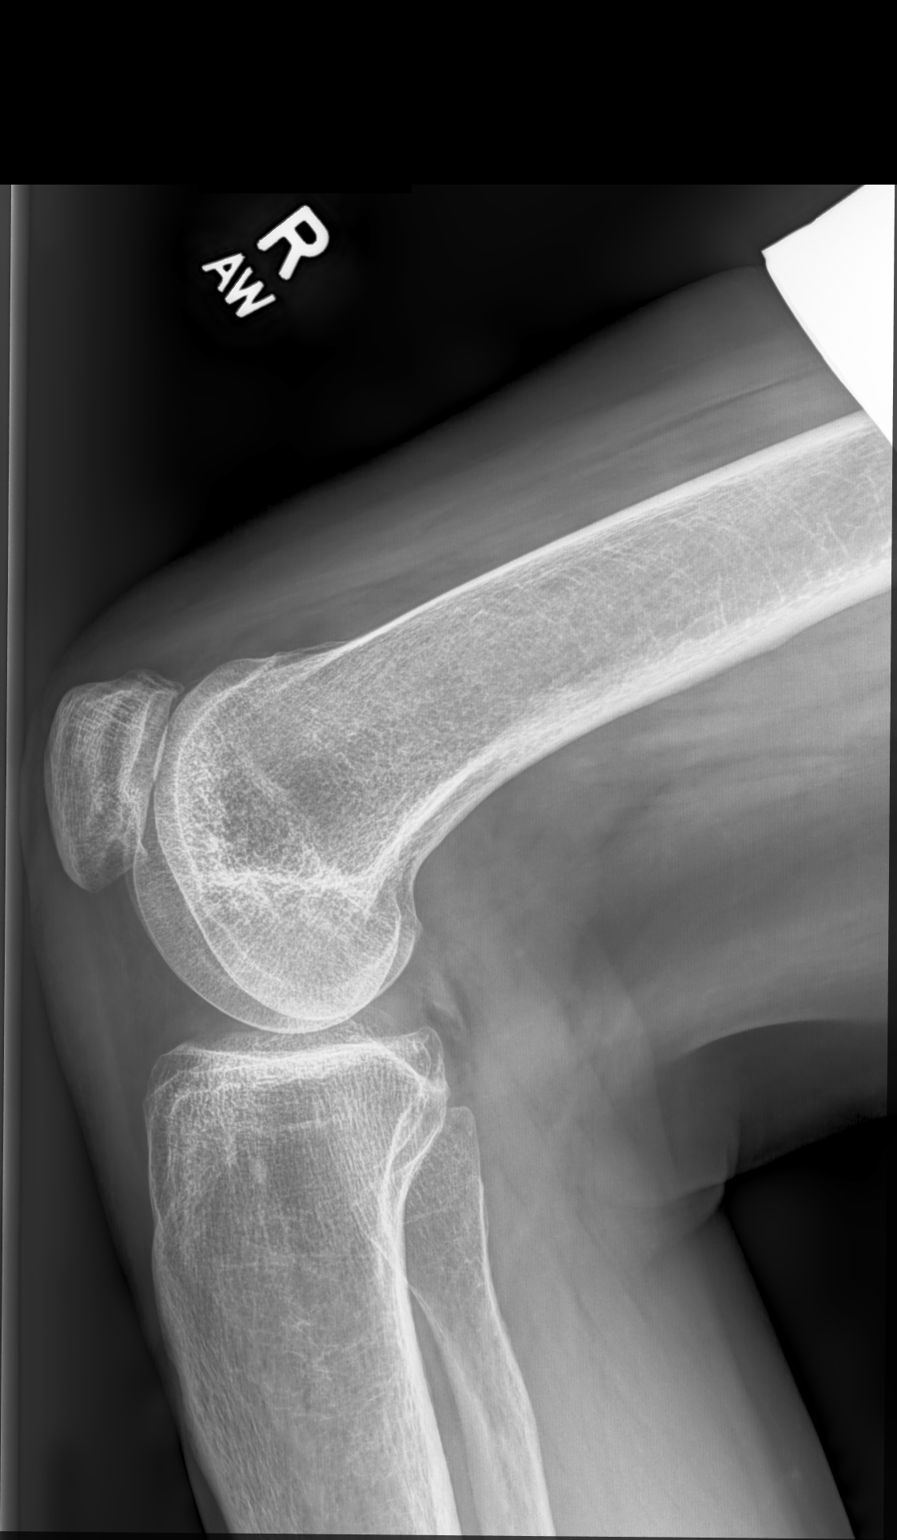

[patella (sunrise)]
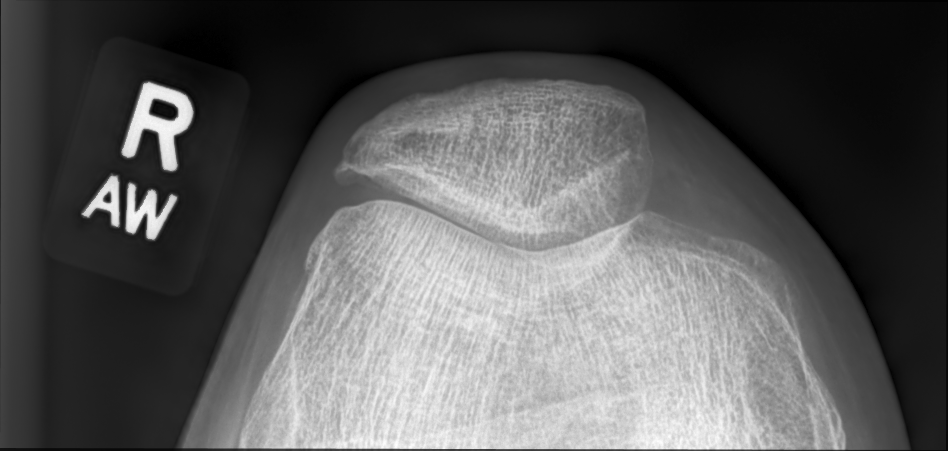

[4 of 4 positions shown; findings below may reference images not displayed]

FINDINGS: Moderate tricompartmental degenerative changes with joint space
narrowing and peaking of the tibial spines. No acute bony findings
or osteochondral lesion. No chondrocalcinosis.

No joint effusion.
IMPRESSION: Moderate tricompartmental degenerative changes but no acute bony
findings or worrisome bone lesions.

No joint effusions.

## 2019-01-07 ENCOUNTER — Encounter: Payer: Self-pay | Admitting: Internal Medicine

## 2019-01-07 ENCOUNTER — Other Ambulatory Visit: Payer: Self-pay

## 2019-01-07 ENCOUNTER — Ambulatory Visit (INDEPENDENT_AMBULATORY_CARE_PROVIDER_SITE_OTHER): Payer: BC Managed Care – PPO | Admitting: Internal Medicine

## 2019-01-07 VITALS — BP 110/80 | HR 80 | Temp 98.2°F | Ht 69.0 in | Wt 152.4 lb

## 2019-01-07 DIAGNOSIS — Z86010 Personal history of colonic polyps: Secondary | ICD-10-CM | POA: Diagnosis not present

## 2019-01-07 DIAGNOSIS — R1319 Other dysphagia: Secondary | ICD-10-CM

## 2019-01-07 DIAGNOSIS — Z1159 Encounter for screening for other viral diseases: Secondary | ICD-10-CM | POA: Diagnosis not present

## 2019-01-07 DIAGNOSIS — R1310 Dysphagia, unspecified: Secondary | ICD-10-CM | POA: Insufficient documentation

## 2019-01-07 NOTE — Patient Instructions (Signed)
You have been scheduled for an endoscopy and colonoscopy. Please follow the written instructions given to you at your visit today. Please pick up your prep supplies at the pharmacy within the next 1-3 days. If you use inhalers (even only as needed), please bring them with you on the day of your procedure.  I appreciate the opportunity to care for you. Carl Gessner, MD, FACG 

## 2019-01-07 NOTE — Assessment & Plan Note (Signed)
Appropriate timing for surveillance colonoscopy.  We will schedule. The risks and benefits as well as alternatives of endoscopic procedure(s) have been discussed and reviewed. All questions answered. The patient agrees to proceed.

## 2019-01-07 NOTE — Assessment & Plan Note (Addendum)
Sounds like a GERD related stricture dysmotility also possible.  Plan for EGD possible esophageal dilation.  May need PPI therapy.The risks and benefits as well as alternatives of endoscopic procedure(s) have been discussed and reviewed. All questions answered. The patient agrees to proceed.  Keep in mind her scoliosis as could be contributing higher risk of hiatal hernia other anatomic deformity

## 2019-01-07 NOTE — Progress Notes (Signed)
Roberta Sims 48 y.o. 1970/07/23 [id number]  Assessment & Plan:  Esophageal dysphagia Sounds like a GERD related stricture dysmotility also possible.  Plan for EGD possible esophageal dilation.  May need PPI therapy.The risks and benefits as well as alternatives of endoscopic procedure(s) have been discussed and reviewed. All questions answered. The patient agrees to proceed.  Keep in mind her scoliosis as could be contributing higher risk of hiatal hernia other anatomic deformity   Hx of colonic polyps Appropriate timing for surveillance colonoscopy.  We will schedule. The risks and benefits as well as alternatives of endoscopic procedure(s) have been discussed and reviewed. All questions answered. The patient agrees to proceed.     I appreciate the opportunity to care for this patient. CC: Ronnald Nian, DO   Subjective:   Chief Complaint: Dysphagia and history of colon polyps  HPI This is a 48 year old white woman with a history of a sessile serrated colon polyp about 5 to 6 years ago (records previously reviewed by me) here to schedule a surveillance colonoscopy as recommended and she is also had 4 to 6 months of intermittent odynophagia but mainly dysphagia.  Suprasternal sticking point to hard foods.  Sometimes will have a little bit of chest pain and has heartburn and indigestion on a frequent basis.  No previous caustic medications like doxycycline taken in general very healthy.  She had an EGD apparently as well in [place].  I have reviewed those records but they are not available to me at this time.  I saw them earlier this year and recommended a recall colonoscopy but things were delayed due to Covid.  She also reports a history of IBS and has had some alternating bowel movements for the past few weeks.  Nothing out of the ordinary it that comes and goes.  Wt Readings from Last 3 Encounters:  01/07/19 152 lb 6.4 oz (69.1 kg)  10/29/18 158 lb 3.2 oz (71.8 kg)   08/27/18 156 lb (70.8 kg)   New recent medications are TriCor for hypertriglyceridemia vitamin D and fish oil No Known Allergies Current Meds  Medication Sig  . Cetirizine HCl (ZYRTEC PO) Take by mouth.  . Cholecalciferol (VITAMIN D-1000 MAX ST) 25 MCG (1000 UT) tablet Take 1 capsule by mouth daily.  . cyclobenzaprine (FLEXERIL) 10 MG tablet Take 1 tablet (10 mg total) by mouth 3 (three) times daily as needed for muscle spasms.  . fenofibrate (TRICOR) 145 MG tablet Take 1 tablet (145 mg total) by mouth daily.  . Omega-3 Fatty Acids (FISH OIL OMEGA-3 PO) Take 1 capsule by mouth daily.  . sodium chloride (OCEAN) 0.65 % SOLN nasal spray Place 1 spray into both nostrils as needed for congestion.   Past Medical History:  Diagnosis Date  . Hx of sessile serrated colonic polyp 06/28/2013  . Scoliosis    Past Surgical History:  Procedure Laterality Date  . COLONOSCOPY    . ESOPHAGOGASTRODUODENOSCOPY    . harrington rod--scoliosis     Social History   Social History Narrative   Divorced, 2 sons born 1990 1995.  They are in [place].   She is a self-employed Press photographer.   0-1 alcoholic beverages a day never smoker no drug use no other tobacco 1-2 caffeinated beverages weekly   family history includes Alcohol abuse in her brother; Colon cancer in her maternal grandmother; Diabetes in her brother; Pancreatic cancer in her mother.   Review of Systems As per HPI some hot flashes fatigue  insomnia joint and back pain allergy problems excess urination and itching at times.  Objective:   Physical Exam @BP  110/80   Pulse 80   Temp 98.2 F (36.8 C)   Ht 5\' 9"  (1.753 m)   Wt 152 lb 6.4 oz (69.1 kg)   BMI 22.51 kg/m @  General:  Well-developed, well-nourished and in no acute distress Eyes:  anicteric. Neck:   supple w/o thyromegaly or mass.  Lungs: Clear to auscultation bilaterally. Heart:  S1S2, no rubs, murmurs, gallops. Abdomen:  soft,  non-tender, no hepatosplenomegaly, hernia, or mass and BS+.  Rectal: Deferred until colonoscopy Lymph:  no cervical or supraclavicular adenopathy. Extremities:   no edema, cyanosis or clubbing Skin   no rash. Neuro:  A&O x 3.  Psych:  appropriate mood and  Affect.   Data Reviewed: See HPI

## 2019-01-18 ENCOUNTER — Encounter: Payer: Self-pay | Admitting: Internal Medicine

## 2019-01-27 ENCOUNTER — Encounter: Payer: BC Managed Care – PPO | Admitting: Internal Medicine

## 2019-03-02 ENCOUNTER — Ambulatory Visit (INDEPENDENT_AMBULATORY_CARE_PROVIDER_SITE_OTHER): Payer: BC Managed Care – PPO

## 2019-03-02 ENCOUNTER — Other Ambulatory Visit: Payer: Self-pay | Admitting: Internal Medicine

## 2019-03-02 DIAGNOSIS — Z1159 Encounter for screening for other viral diseases: Secondary | ICD-10-CM

## 2019-03-03 LAB — SARS CORONAVIRUS 2 (TAT 6-24 HRS): SARS Coronavirus 2: NEGATIVE

## 2019-03-04 ENCOUNTER — Other Ambulatory Visit: Payer: Self-pay

## 2019-03-04 ENCOUNTER — Encounter: Payer: Self-pay | Admitting: Internal Medicine

## 2019-03-04 ENCOUNTER — Ambulatory Visit (INDEPENDENT_AMBULATORY_CARE_PROVIDER_SITE_OTHER): Payer: BC Managed Care – PPO | Admitting: Internal Medicine

## 2019-03-04 VITALS — BP 115/65 | HR 77 | Temp 98.2°F | Resp 20 | Ht 69.0 in | Wt 152.0 lb

## 2019-03-04 DIAGNOSIS — K222 Esophageal obstruction: Secondary | ICD-10-CM | POA: Diagnosis not present

## 2019-03-04 DIAGNOSIS — K219 Gastro-esophageal reflux disease without esophagitis: Secondary | ICD-10-CM | POA: Insufficient documentation

## 2019-03-04 DIAGNOSIS — K449 Diaphragmatic hernia without obstruction or gangrene: Secondary | ICD-10-CM | POA: Diagnosis not present

## 2019-03-04 DIAGNOSIS — R1310 Dysphagia, unspecified: Secondary | ICD-10-CM | POA: Diagnosis not present

## 2019-03-04 DIAGNOSIS — Z86010 Personal history of colonic polyps: Secondary | ICD-10-CM

## 2019-03-04 DIAGNOSIS — R1319 Other dysphagia: Secondary | ICD-10-CM

## 2019-03-04 DIAGNOSIS — K2950 Unspecified chronic gastritis without bleeding: Secondary | ICD-10-CM | POA: Diagnosis not present

## 2019-03-04 DIAGNOSIS — Z1211 Encounter for screening for malignant neoplasm of colon: Secondary | ICD-10-CM | POA: Diagnosis not present

## 2019-03-04 HISTORY — DX: Gastro-esophageal reflux disease without esophagitis: K21.9

## 2019-03-04 MED ORDER — OMEPRAZOLE 40 MG PO CPDR: 40.0000 mg | DELAYED_RELEASE_CAPSULE | Freq: Every day | ORAL | 3 refills | Status: DC

## 2019-03-04 MED ORDER — SODIUM CHLORIDE 0.9 % IV SOLN: 500.0000 mL | Freq: Once | INTRAVENOUS | Status: DC

## 2019-03-04 NOTE — Patient Instructions (Addendum)
There was a narrow area called a stricture in the esophagus. I dilated it to allow better swallowing. The appearance is that of a stricture due to acid reflux so I have prescribed omeprazole to reduce acid and with hopes that this will prevent further stricture formation. In general I think you will need this type of medication long-term. If you  do not have complete resolution of your problems please return to see me or at least send a My Chart message or call and let me know.  A GERD lifestyle regimen will help also.  Follow an antireflux regimen.  This includes:      - Do not lie down for at least 3 to 4 hours after meals.       - Raise the head of the bed 4 to 6 inches.       - Decrease excess weight.       - Avoid citrus juices and other acidic foods, alcohol, chocolate, mints, coffee and other caffeinated beverages, carbonated beverages, fatty and fried foods.       - Avoid tight-fitting clothing.       - Avoid cigarettes and other tobacco products.   You also have a small hiatal hernia and had some gastritis (inflammation in stomach). I took some biopsies to see if you have an infection called H pylori that can cause the gastritis.  The colonoscopy was normal. I recommend a routine repeat in 2030 - 10 years.  I appreciate the opportunity to care for you. Iva Boop, MD, [facility]   Handouts given on Post Dilation diet, Hiatal Hernia and GERD, and Gastritis.   YOU HAD AN ENDOSCOPIC PROCEDURE TODAY AT THE  ENDOSCOPY CENTER:   Refer to the procedure report that was given to you for any specific questions about what was found during the examination.  If the procedure report does not answer your questions, please call your gastroenterologist to clarify.  If you requested that your care partner not be given the details of your procedure findings, then the procedure report has been included in a sealed envelope for you to review at your convenience later.  YOU SHOULD EXPECT: Some  feelings of bloating in the abdomen. Passage of more gas than usual.  Walking can help get rid of the air that was put into your GI tract during the procedure and reduce the bloating. If you had a lower endoscopy (such as a colonoscopy or flexible sigmoidoscopy) you may notice spotting of blood in your stool or on the toilet paper. If you underwent a bowel prep for your procedure, you may not have a normal bowel movement for a few days.  Please Note:  You might notice some irritation and congestion in your nose or some drainage.  This is from the oxygen used during your procedure.  There is no need for concern and it should clear up in a day or so.  SYMPTOMS TO REPORT IMMEDIATELY:   Following lower endoscopy (colonoscopy or flexible sigmoidoscopy):  Excessive amounts of blood in the stool  Significant tenderness or worsening of abdominal pains  Swelling of the abdomen that is new, acute  Fever of 100F or higher   Following upper endoscopy (EGD)  Vomiting of blood or coffee ground material  New chest pain or pain under the shoulder blades  Painful or persistently difficult swallowing  New shortness of breath  Fever of 100F or higher  Black, tarry-looking stools  For urgent or emergent issues, a gastroenterologist can  be reached at any hour by calling [phone].   DIET:  We do recommend a small meal at first, but then you may proceed to your regular diet.  Drink plenty of fluids but you should avoid alcoholic beverages for 24 hours.  ACTIVITY:  You should plan to take it easy for the rest of today and you should NOT DRIVE or use heavy machinery until tomorrow (because of the sedation medicines used during the test).    FOLLOW UP: Our staff will call the number listed on your records 48-72 hours following your procedure to check on you and address any questions or concerns that you may have regarding the information given to you following your procedure. If we do not reach you, we  will leave a message.  We will attempt to reach you two times.  During this call, we will ask if you have developed any symptoms of COVID 19. If you develop any symptoms (ie: fever, flu-like symptoms, shortness of breath, cough etc.) before then, please call [phone].  If you test positive for Covid 19 in the 2 weeks post procedure, please call and report this information to [place].    If any biopsies were taken you will be contacted by phone or by letter within the next 1-3 weeks.  Please call us at [phone] if you have not heard about the biopsies in 3 weeks.    SIGNATURES/CONFIDENTIALITY: You and/or your care partner have signed paperwork which will be entered into your electronic medical record.  These signatures attest to the fact that that the information above on your After Visit Summary has been reviewed and is understood.  Full responsibility of the confidentiality of this discharge information lies with you and/or your care-partner.

## 2019-03-04 NOTE — Op Note (Signed)
[facility] Patient Name: Roberta Sims Procedure Date: 03/04/2019 8:54 AM MRN: [id number] Endoscopist: Iva Boop , MD Age: 49 Referring MD:  Date of Birth: Jan 12, 1971 Gender: Female Account #: [id number] Procedure:                Upper GI endoscopy Indications:              Dysphagia Medicines:                Propofol per Anesthesia, Monitored Anesthesia Care Procedure:                Pre-Anesthesia Assessment:                           - Prior to the procedure, a History and Physical                            was performed, and patient medications and                            allergies were reviewed. The patient's tolerance of                            previous anesthesia was also reviewed. The risks                            and benefits of the procedure and the sedation                            options and risks were discussed with the patient.                            All questions were answered, and informed consent                            was obtained. Prior Anticoagulants: The patient has                            taken no previous anticoagulant or antiplatelet                            agents. ASA Grade Assessment: II - A patient with                            mild systemic disease. After reviewing the risks                            and benefits, the patient was deemed in                            satisfactory condition to undergo the procedure.                           After obtaining informed consent, the endoscope was  passed under direct vision. Throughout the                            procedure, the patient's blood pressure, pulse, and                            oxygen saturations were monitored continuously. The                            Endoscope was introduced through the mouth, and                            advanced to the second part of duodenum. The upper                            GI endoscopy was  accomplished without difficulty.                            The patient tolerated the procedure well. Scope In: Scope Out: Findings:                 One benign-appearing, intrinsic moderate stenosis                            was found at the gastroesophageal junction. This                            stenosis measured 1.6 cm (inner diameter). The                            stenosis was traversed. A TTS dilator was passed                            through the scope. Dilation with an 18-19-20 mm                            balloon dilator was performed to 20 mm. The                            dilation site was examined and showed mild mucosal                            disruption. Estimated blood loss was minimal.                           The examined esophagus was mildly tortuous.                           A 4 cm hiatal hernia was found. The hiatal                            narrowing was 40 cm from the incisors. The Z-line  was 36 cm from the incisors.                           Patchy inflammation characterized by erosions was                            found in the prepyloric region of the stomach.                            Biopsies were taken with a cold forceps for                            Helicobacter pylori testing using CLOtest.                            Verification of patient identification for the                            specimen was done. Estimated blood loss was minimal.                           The exam was otherwise without abnormality.                           The cardia and gastric fundus were normal on                            retroflexion. Complications:            No immediate complications. Estimated Blood Loss:     Estimated blood loss was minimal. Impression:               - Benign-appearing esophageal stenosis. Dilated.                           - Tortuous esophagus.                           - 4 cm hiatal hernia.                            - Gastritis. Biopsied.                           - The examination was otherwise normal. Recommendation:           - Patient has a contact number available for                            emergencies. The signs and symptoms of potential                            delayed complications were discussed with the                            patient. Return to normal activities tomorrow.  Written discharge instructions were provided to the                            patient.                           - Clear liquids x 1 hour then soft foods rest of                            day. Start prior diet tomorrow.                           - Continue present medications.                           - Follow an antireflux regimen.                           - Use Prilosec (omeprazole) 40 mg PO daily                            indefinitely. Iva Boop, MD 03/04/2019 9:42:49 AM This report has been signed electronically.

## 2019-03-04 NOTE — Progress Notes (Signed)
Called to room to assist during endoscopic procedure.  Patient ID and intended procedure confirmed with present staff. Received instructions for my participation in the procedure from the performing physician.  

## 2019-03-04 NOTE — Op Note (Signed)
[facility] Patient Name: Roberta Sims Procedure Date: 03/04/2019 8:54 AM MRN: [id number] Endoscopist: Iva Boop , MD Age: 49 Referring MD:  Date of Birth: Feb 28, 1970 Gender: Female Account #: [id number] Procedure:                Colonoscopy Indications:              High risk colon cancer surveillance: Personal                            history of sessile serrated colon polyp (less than                            10 mm in size) with no dysplasia, Last colonoscopy:                            2015 Medicines:                Propofol per Anesthesia, Monitored Anesthesia Care Procedure:                Pre-Anesthesia Assessment:                           - Prior to the procedure, a History and Physical                            was performed, and patient medications and                            allergies were reviewed. The patient's tolerance of                            previous anesthesia was also reviewed. The risks                            and benefits of the procedure and the sedation                            options and risks were discussed with the patient.                            All questions were answered, and informed consent                            was obtained. Prior Anticoagulants: The patient has                            taken no previous anticoagulant or antiplatelet                            agents. ASA Grade Assessment: II - A patient with                            mild systemic disease. After reviewing the risks  and benefits, the patient was deemed in                            satisfactory condition to undergo the procedure.                           After obtaining informed consent, the colonoscope                            was passed under direct vision. Throughout the                            procedure, the patient's blood pressure, pulse, and                            oxygen saturations were monitored  continuously. The                            Colonoscope was introduced through the anus and                            advanced to the the cecum, identified by                            appendiceal orifice and ileocecal valve. The                            colonoscopy was performed without difficulty. The                            patient tolerated the procedure well. The quality                            of the bowel preparation was excellent. The                            ileocecal valve, appendiceal orifice, and rectum                            were photographed. The bowel preparation used was                            Miralax via split dose instruction. Scope In: 9:14:29 AM Scope Out: 9:27:22 AM Scope Withdrawal Time: 0 hours 9 minutes 49 seconds  Total Procedure Duration: 0 hours 12 minutes 53 seconds  Findings:                 The perianal and digital rectal examinations were                            normal.                           The entire examined colon appeared normal on direct  and retroflexion views. Complications:            No immediate complications. Estimated Blood Loss:     Estimated blood loss: none. Impression:               - The entire examined colon is normal on direct and                            retroflexion views.                           - No specimens collected. Recommendation:           - Patient has a contact number available for                            emergencies. The signs and symptoms of potential                            delayed complications were discussed with the                            patient. Return to normal activities tomorrow.                            Written discharge instructions were provided to the                            patient.                           - Clear liquids x 1 hour then soft foods rest of                            day. Start prior diet tomorrow.                            - Continue present medications.                           - Repeat colonoscopy in 10 years. hx diminutive ssp                            2015 Iva Boop, MD 03/04/2019 9:44:55 AM This report has been signed electronically.

## 2019-03-04 NOTE — Progress Notes (Signed)
Report to PACU, RN, vss, BBS= Clear.  

## 2019-03-05 LAB — HELICOBACTER PYLORI SCREEN-BIOPSY: UREASE: NEGATIVE

## 2019-03-08 ENCOUNTER — Telehealth: Payer: Self-pay | Admitting: *Deleted

## 2019-03-08 NOTE — Telephone Encounter (Signed)
  Follow up Call-  Call back number 03/04/2019  Post procedure Call Back phone  # [phone]  Permission to leave phone message Yes  Some recent data might be hidden     Patient questions:  Do you have a fever, pain , or abdominal swelling? No. Pain Score  0 *  Have you tolerated food without any problems? Yes.    Have you been able to return to your normal activities? Yes.    Do you have any questions about your discharge instructions: Diet   No. Medications  No. Follow up visit  No.  Do you have questions or concerns about your Care? No.  Actions: * If pain score is 4 or above: No action needed, pain <4. 1. Have you developed a fever since your procedure? no  2.   Have you had an respiratory symptoms (SOB or cough) since your procedure? no  3.   Have you tested positive for COVID 19 since your procedure no  4.   Have you had any family members/close contacts diagnosed with the COVID 19 since your procedure?  no   If yes to any of these questions please route to Laverna Peace, RN and Jennye Boroughs, Charity fundraiser.

## 2019-04-09 DIAGNOSIS — R432 Parageusia: Secondary | ICD-10-CM | POA: Diagnosis not present

## 2019-04-09 DIAGNOSIS — R430 Anosmia: Secondary | ICD-10-CM | POA: Diagnosis not present

## 2019-04-12 ENCOUNTER — Telehealth: Payer: Self-pay | Admitting: Family Medicine

## 2019-04-12 NOTE — Telephone Encounter (Signed)
Patient is calling and stated that she thinks she may have a sinus infection and if something can be called in to [facility] at [place]. Pt had a covid test on 2/12 and it was negative. CB is [phone].

## 2019-04-12 NOTE — Telephone Encounter (Signed)
Patient is calling back regarding previous message. Informed patient provider was out of the office and to see if she can be seen in another office due to no availability at [facility].

## 2019-04-13 NOTE — Telephone Encounter (Signed)
Pt is scheduled for a virtual visit on Thursday.

## 2019-04-15 ENCOUNTER — Encounter: Payer: Self-pay | Admitting: Family Medicine

## 2019-04-15 ENCOUNTER — Telehealth (INDEPENDENT_AMBULATORY_CARE_PROVIDER_SITE_OTHER): Payer: BC Managed Care – PPO | Admitting: Family Medicine

## 2019-04-15 VITALS — Ht 69.0 in | Wt 150.0 lb

## 2019-04-15 DIAGNOSIS — Z20822 Contact with and (suspected) exposure to covid-19: Secondary | ICD-10-CM | POA: Diagnosis not present

## 2019-04-15 DIAGNOSIS — G4700 Insomnia, unspecified: Secondary | ICD-10-CM

## 2019-04-15 MED ORDER — ZOLPIDEM TARTRATE 5 MG PO TABS: 5.0000 mg | ORAL_TABLET | Freq: Every evening | ORAL | 0 refills | Status: DC | PRN

## 2019-04-15 NOTE — Progress Notes (Signed)
Virtual Visit via Video Note  I connected with Roberta Sims on 04/15/19 at 10:00 AM EST by a video enabled telemedicine application and verified that I am speaking with the correct person using two identifiers. Location patient: home Location provider:  home office Persons participating in the virtual visit: patient, provider  I discussed the limitations of evaluation and management by telemedicine and the availability of in person appointments. The patient expressed understanding and agreed to proceed.  Chief Complaint  Patient presents with  . Sinusitis    c/o sinus headaches, cough, pressure, can not taste or smell, tested negative for covid last Friday. Symptoms becoming worse      HPI: Roberta Sims is a 49 y.o. female who complains of "sinus headache" x couple of weeks. Headache would come and go.  On Tues 04/06/19, pt state she "felt bad" and the following day she felt "sick", napped for 4 hours and then slept thru the night. She feels fatigued, achy. No sore throat, fever, CP, SOB. Friday 04/09/19, pt noticed decreased/lost of taste and smell. This is intermittent. She had rapid covid test that day that was negative.  Cough that started in past few days. Partner sick in the past few days with fatigue, cough, hot/cold.   Pt also complains of trouble sleeping. Previous PCP has pt take PRN ambien 2.5-5mg . Pt has not used in some time. She recently tried melatonin but it gave her bad dreams.    Past Medical History:  Diagnosis Date  . GERD with stricture 03/04/2019  . Hx of sessile serrated colonic polyp 06/28/2013  . Scoliosis     Past Surgical History:  Procedure Laterality Date  . COLONOSCOPY    . ESOPHAGOGASTRODUODENOSCOPY    . harrington rod--scoliosis      Family History  Problem Relation Age of Onset  . Pancreatic cancer Mother        metastasized to colon  . Alcohol abuse Brother   . Diabetes Brother   . Colon cancer Maternal Grandmother     Social History    Tobacco Use  . Smoking status: Never Smoker  . Smokeless tobacco: Never Used  Substance Use Topics  . Alcohol use: Yes    Comment: socially  . Drug use: Never     Current Outpatient Medications:  .  Cetirizine HCl (ZYRTEC PO), Take by mouth., Disp: , Rfl:  .  Cholecalciferol (VITAMIN D-1000 MAX ST) 25 MCG (1000 UT) tablet, Take 1 capsule by mouth daily., Disp: , Rfl:  .  cyclobenzaprine (FLEXERIL) 10 MG tablet, Take 1 tablet (10 mg total) by mouth 3 (three) times daily as needed for muscle spasms., Disp: 30 tablet, Rfl: 1 .  Omega-3 Fatty Acids (FISH OIL OMEGA-3 PO), Take 1 capsule by mouth daily., Disp: , Rfl:  .  sodium chloride (OCEAN) 0.65 % SOLN nasal spray, Place 1 spray into both nostrils as needed for congestion., Disp: 15 mL, Rfl: 0 .  fenofibrate (TRICOR) 145 MG tablet, Take 1 tablet (145 mg total) by mouth daily. (Patient not taking: Reported on 04/15/2019), Disp: 90 tablet, Rfl: 3 .  omeprazole (PRILOSEC) 40 MG capsule, Take 1 capsule (40 mg total) by mouth daily before breakfast. (Patient not taking: Reported on 04/15/2019), Disp: 90 capsule, Rfl: 3 .  Pediatric Multivitamins-Iron (FLINTSTONES COMPLETE) 18 MG CHEW, Chew by mouth., Disp: , Rfl:   No Known Allergies    ROS: See pertinent positives and negatives per HPI.   EXAM:  VITALS per patient if applicable: Ht 5'  9" (1.753 m)   Wt 150 lb (68 kg)   BMI 22.15 kg/m    GENERAL: alert, oriented, and in no acute distress  HEENT: atraumatic, conjunctiva clear, no obvious abnormalities on inspection of external nose and ears  NECK: normal movements of the head and neck  LUNGS: on inspection no signs of respiratory distress, breathing rate appears normal, no obvious gross SOB, gasping or wheezing, no conversational dyspnea  CV: no obvious cyanosis  MS: moves all visible extremities without noticeable abnormality  PSYCH/NEURO: pleasant and cooperative, speech and thought processing grossly intact   ASSESSMENT  AND PLAN:  1. Insomnia, unspecified type - PMP reviewed and appropriate Rx: - zolpidem (AMBIEN) 5 MG tablet; Take 1 tablet (5 mg total) by mouth at bedtime as needed for sleep.  Dispense: 30 tablet; Refill: 0 - f/u in 1 mo or sooner PRN  2. Suspected COVID-19 virus infection - I feel pts symptoms are very suspicious for COVID infection and feel rapid test was likely a false negative. Recommended pt get tested via PCR testing and gave her contact info to schedule appt at [facility] test site. - cont with quarantine, Vit C, Vit D, zinc, supportive care - f/u if symptoms worsen  Discussed plan and reviewed medications with patient, including risks, benefits, and potential side effects. Pt expressed understand. All questions answered.   I discussed the assessment and treatment plan with the patient. The patient was provided an opportunity to ask questions and all were answered. The patient agreed with the plan and demonstrated an understanding of the instructions.   The patient was advised to call back or seek an in-person evaluation if the symptoms worsen or if the condition fails to improve as anticipated.   Luana Shu, DO

## 2019-04-19 ENCOUNTER — Ambulatory Visit: Payer: BC Managed Care – PPO | Attending: Internal Medicine

## 2019-04-19 ENCOUNTER — Other Ambulatory Visit: Payer: BC Managed Care – PPO

## 2019-04-19 DIAGNOSIS — Z20822 Contact with and (suspected) exposure to covid-19: Secondary | ICD-10-CM

## 2019-04-20 ENCOUNTER — Telehealth: Payer: Self-pay | Admitting: Family Medicine

## 2019-04-20 LAB — NOVEL CORONAVIRUS, NAA: SARS-CoV-2, NAA: NOT DETECTED

## 2019-04-20 NOTE — Telephone Encounter (Signed)
Patient is calling and stated that she tested negative and husband tested positive if she should get the antibody test for covid. Also patient stated that she is still not 100 percent herself and wanted to know what she should do next. Pls advise. CB is [phone]

## 2019-04-20 NOTE — Telephone Encounter (Signed)
Negative COVID results given. Patient results "NOT Detected." Caller expressed understanding. ° °

## 2019-04-20 NOTE — Telephone Encounter (Signed)
I spoke with pt in regards to testing for antibodies.  Pt is requesting test due to her boyfriend testing positive and she testing neg.  Pt is concerned about whom passed it to whom.  Pt explains that she would like to know if she should quarantine with boyfriend since they are around each other or is she safe to go around other people.  I informed pt to stay in until we get feed back from Dr. Salena Saner, as she is out of office today and will return tomorrow.  Pt had a virtual visit last week w/Dr. C.  Please advise.

## 2019-04-21 NOTE — Telephone Encounter (Signed)
I don't know that there is a role for antibody testing. She could have false negative testing, especially since partner tested positive and she has COVID-like symptoms including loss of taste and smell. I would call her a presumed positive case in spite of test result. She should complete her 10 days quarantine from start of symptoms. Partner should do the same. I do not think she needs to quarantine for 10-14 days after her last exposure to him or from the end of his quarantine period.

## 2019-04-22 NOTE — Telephone Encounter (Signed)
Left message to call office

## 2019-04-22 NOTE — Telephone Encounter (Signed)
I spoke with pt and informed her of Dr. Renaye Rakers message.  Pt verbalized understanding.

## 2019-05-05 ENCOUNTER — Encounter: Payer: Self-pay | Admitting: Family Medicine

## 2019-05-05 ENCOUNTER — Ambulatory Visit (INDEPENDENT_AMBULATORY_CARE_PROVIDER_SITE_OTHER): Payer: BC Managed Care – PPO | Admitting: Family Medicine

## 2019-05-05 ENCOUNTER — Other Ambulatory Visit: Payer: Self-pay

## 2019-05-05 VITALS — BP 110/84 | HR 109 | Temp 98.1°F | Ht 69.0 in | Wt 155.4 lb

## 2019-05-05 DIAGNOSIS — M549 Dorsalgia, unspecified: Secondary | ICD-10-CM

## 2019-05-05 DIAGNOSIS — M5441 Lumbago with sciatica, right side: Secondary | ICD-10-CM | POA: Diagnosis not present

## 2019-05-05 DIAGNOSIS — M5442 Lumbago with sciatica, left side: Secondary | ICD-10-CM

## 2019-05-05 LAB — POCT URINALYSIS DIPSTICK
Bilirubin, UA: NEGATIVE
Blood, UA: POSITIVE
Glucose, UA: NEGATIVE
Ketones, UA: NEGATIVE
Leukocytes, UA: NEGATIVE
Nitrite, UA: NEGATIVE
Protein, UA: POSITIVE — AB
Spec Grav, UA: >=1.03 — AB (ref 1.010–1.025)
Urobilinogen, UA: 0.2 E.U./dL
pH, UA: 6 (ref 5.0–8.0)

## 2019-05-05 MED ORDER — CYCLOBENZAPRINE HCL 10 MG PO TABS: 10.0000 mg | ORAL_TABLET | Freq: Every day | ORAL | 1 refills | Status: DC

## 2019-05-05 MED ORDER — PREDNISONE 20 MG PO TABS: ORAL_TABLET | ORAL | 0 refills | Status: DC

## 2019-05-05 NOTE — Progress Notes (Signed)
Roberta Sims is a 49 y.o. female  Chief Complaint  Patient presents with  . Back Pain    Pt has a hx of lower back pain.  Pt said that it has worsened since NOv 2020    HPI: Roberta Sims Patient is a 49 y.o. female with h/o LBP and scoliosis s/p rod placement.  Pain Lt > Rt. Pain radiates down left Lt and into foot. Symptoms worse since 12/2018 and more so in past 2-3 wks. Pain wakes her up from sleep at times. Intermittent tingling.  Pain interferes with sleep, sex life, daily activities.  Moved to new home w/ all hardwood floors - recently started wearing shoes in house. She also started wearing new shoes ([place]) in early 01/2019, stopped wearing 3 days ago.  New bedroom set w/ mattress - too firm for pt Pt saw medical massage therapist in [place] twice a few weeks ago. She is doing home stretches/exercises.  Pt has taken muscle relaxant daily x 1 week which has helped. She has been taking ibuprofen 800mg  2-3x/day x weeks. No loss of bowel or bladder function. No saddle anesthesia.  Past Medical History:  Diagnosis Date  . GERD with stricture 03/04/2019  . Hx of sessile serrated colonic polyp 06/28/2013  . Scoliosis     Past Surgical History:  Procedure Laterality Date  . COLONOSCOPY    . ESOPHAGOGASTRODUODENOSCOPY    . harrington rod--scoliosis      Social History   Socioeconomic History  . Marital status: Single    Spouse name: Not on file  . Number of children: Not on file  . Years of education: Not on file  . Highest education level: Not on file  Occupational History  . 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 Smoking status: Never Smoker  . Smokeless tobacco: Never Used  Substance and Sexual Activity  . Alcohol use: Yes    Comment: socially  . Drug use: Never  . Sexual activity: Yes    Birth control/protection: Surgical  Other Topics Concern  . Not on file  Social History Narrative   Divorced, 2 sons born  1990 1995.  They are in 04-17-1980.   She is a self-employed [place].   0-1 alcoholic beverages a day never smoker no drug use no other tobacco 1-2 caffeinated beverages weekly   Social Determinants of Health   Financial Resource Strain:   . Difficulty of Paying Living Expenses: Not on file  Food Insecurity:   . Worried About Pharmacist, community in the Last Year: Not on file  . Ran Out of Food in the Last Year: Not on file  Transportation Needs:   . Lack of Transportation (Medical): Not on file  . Lack of Transportation (Non-Medical): Not on file  Physical Activity:   . Days of Exercise per Week: Not on file  . Minutes of Exercise per Session: Not on file  Stress:   . Feeling of Stress : Not on file  Social Connections:   . Frequency of Communication with Friends and Family: Not on file  . Frequency of Social Gatherings with Friends and Family: Not on file  . Attends Religious Services: Not on file  . Active Member of Clubs or Organizations: Not on file  . Attends Programme researcher, broadcasting/film/video Meetings: Not on file  . Marital Status: Not on file  Intimate Partner Violence:   . Fear of Current or  Ex-Partner: Not on file  . Emotionally Abused: Not on file  . Physically Abused: Not on file  . Sexually Abused: Not on file    Family History  Problem Relation Age of Onset  . Pancreatic cancer Mother        metastasized to colon  . Alcohol abuse Brother   . Diabetes Brother   . Colon cancer Maternal Grandmother      Immunization History  Administered Date(s) Administered  . Tdap 10/29/2018    Outpatient Encounter Medications as of 05/05/2019  Medication Sig  . Ascorbic Acid (VITAMIN C) 100 MG tablet Take 100 mg by mouth daily.  . Cetirizine HCl (ZYRTEC PO) Take by mouth.  . Cholecalciferol (VITAMIN D-1000 MAX ST) 25 MCG (1000 UT) tablet Take 1 capsule by mouth daily.  . cyclobenzaprine (FLEXERIL) 10 MG tablet Take 1 tablet (10 mg total) by mouth  3 (three) times daily as needed for muscle spasms.  . Omega-3 Fatty Acids (FISH OIL OMEGA-3 PO) Take 1 capsule by mouth daily.  . Pediatric Multivitamins-Iron (FLINTSTONES COMPLETE) 18 MG CHEW Chew by mouth.  . sodium chloride (OCEAN) 0.65 % SOLN nasal spray Place 1 spray into both nostrils as needed for congestion.  Marland Kitchen zinc gluconate 50 MG tablet Take 50 mg by mouth daily.  Marland Kitchen zolpidem (AMBIEN) 5 MG tablet Take 1 tablet (5 mg total) by mouth at bedtime as needed for sleep.  . fenofibrate (TRICOR) 145 MG tablet Take 1 tablet (145 mg total) by mouth daily. (Patient not taking: Reported on 05/05/2019)  . omeprazole (PRILOSEC) 40 MG capsule Take 1 capsule (40 mg total) by mouth daily before breakfast. (Patient not taking: Reported on 05/05/2019)  . predniSONE (DELTASONE) 20 MG tablet 3 tab po x 3 days, 2 tabs po x 3 days, 1 tab po x 3 day, 1/2 tab po x 3 days   No facility-administered encounter medications on file as of 05/05/2019.     ROS: Pertinent positives and negatives noted in HPI. Remainder of ROS non-contributory   No Known Allergies  BP 110/84 (BP Location: Left Arm, Patient Position: Sitting, Cuff Size: Normal)   Pulse (!) 109   Temp 98.1 F (36.7 C) (Temporal)   Ht 5\' 9"  (1.753 m)   Wt 155 lb 6.4 oz (70.5 kg)   SpO2 98%   BMI 22.95 kg/m   Physical Exam  Constitutional: She is oriented to person, place, and time. She appears well-developed and well-nourished. No distress.  Musculoskeletal:        General: No edema.     Lumbar back: Spasms and tenderness present. No bony tenderness. Decreased range of motion.       Back:  Neurological: She is alert and oriented to person, place, and time. Coordination normal.  Skin: Skin is warm and dry.  Psychiatric: She has a normal mood and affect. Her behavior is normal.     A/P:  1. Acute on chronic low back pain 2. Left-sided low back pain with bilateral sciatica, unspecified chronicity - POCT urinalysis dipstick - Ambulatory  referral to Physical Therapy - cont with flexeril 5-10mg  qHS - heating pad BID Rx: - predniSONE (DELTASONE) 20 MG tablet; 3 tab po x 3 days, 2 tabs po x 3 days, 1 tab po x 3 day, 1/2 tab po x 3 days  Dispense: 21 tablet; Refill: 0 Refill: - cyclobenzaprine (FLEXERIL) 10 MG tablet; Take 1 tablet (10 mg total) by mouth at bedtime.  Dispense: 30 tablet; Refill: 1 - f/u  in 2-3 wks if no or minimal improvement, sooner PRN   I personally spent 30 min with the patient today and greater than 50% was spent in counseling, coordination of care, education  This visit occurred during the SARS-CoV-2 public health emergency.  Safety protocols were in place, including screening questions prior to the visit, additional usage of staff PPE, and extensive cleaning of exam room while observing appropriate contact time as indicated for disinfecting solutions.

## 2019-07-29 ENCOUNTER — Other Ambulatory Visit: Payer: Self-pay

## 2019-07-29 ENCOUNTER — Ambulatory Visit (INDEPENDENT_AMBULATORY_CARE_PROVIDER_SITE_OTHER): Payer: BC Managed Care – PPO | Admitting: Family Medicine

## 2019-07-29 ENCOUNTER — Ambulatory Visit (INDEPENDENT_AMBULATORY_CARE_PROVIDER_SITE_OTHER): Payer: BC Managed Care – PPO

## 2019-07-29 ENCOUNTER — Encounter: Payer: Self-pay | Admitting: Family Medicine

## 2019-07-29 VITALS — BP 110/88 | HR 98 | Temp 97.8°F | Ht 69.0 in | Wt 159.0 lb

## 2019-07-29 DIAGNOSIS — R0789 Other chest pain: Secondary | ICD-10-CM | POA: Diagnosis not present

## 2019-07-29 DIAGNOSIS — N951 Menopausal and female climacteric states: Secondary | ICD-10-CM | POA: Diagnosis not present

## 2019-07-29 DIAGNOSIS — R7301 Impaired fasting glucose: Secondary | ICD-10-CM | POA: Diagnosis not present

## 2019-07-29 DIAGNOSIS — E781 Pure hyperglyceridemia: Secondary | ICD-10-CM

## 2019-07-29 DIAGNOSIS — R079 Chest pain, unspecified: Secondary | ICD-10-CM | POA: Diagnosis not present

## 2019-07-29 IMAGING — DX DG CHEST 2V
2 series · 2 of 2 positions shown · non-contrast
Comparison: None.

CLINICAL DATA: Left-sided chest pain

EXAM:
LEFT RIBS - 2 VIEW; CHEST - 2 VIEW

[chest pa]
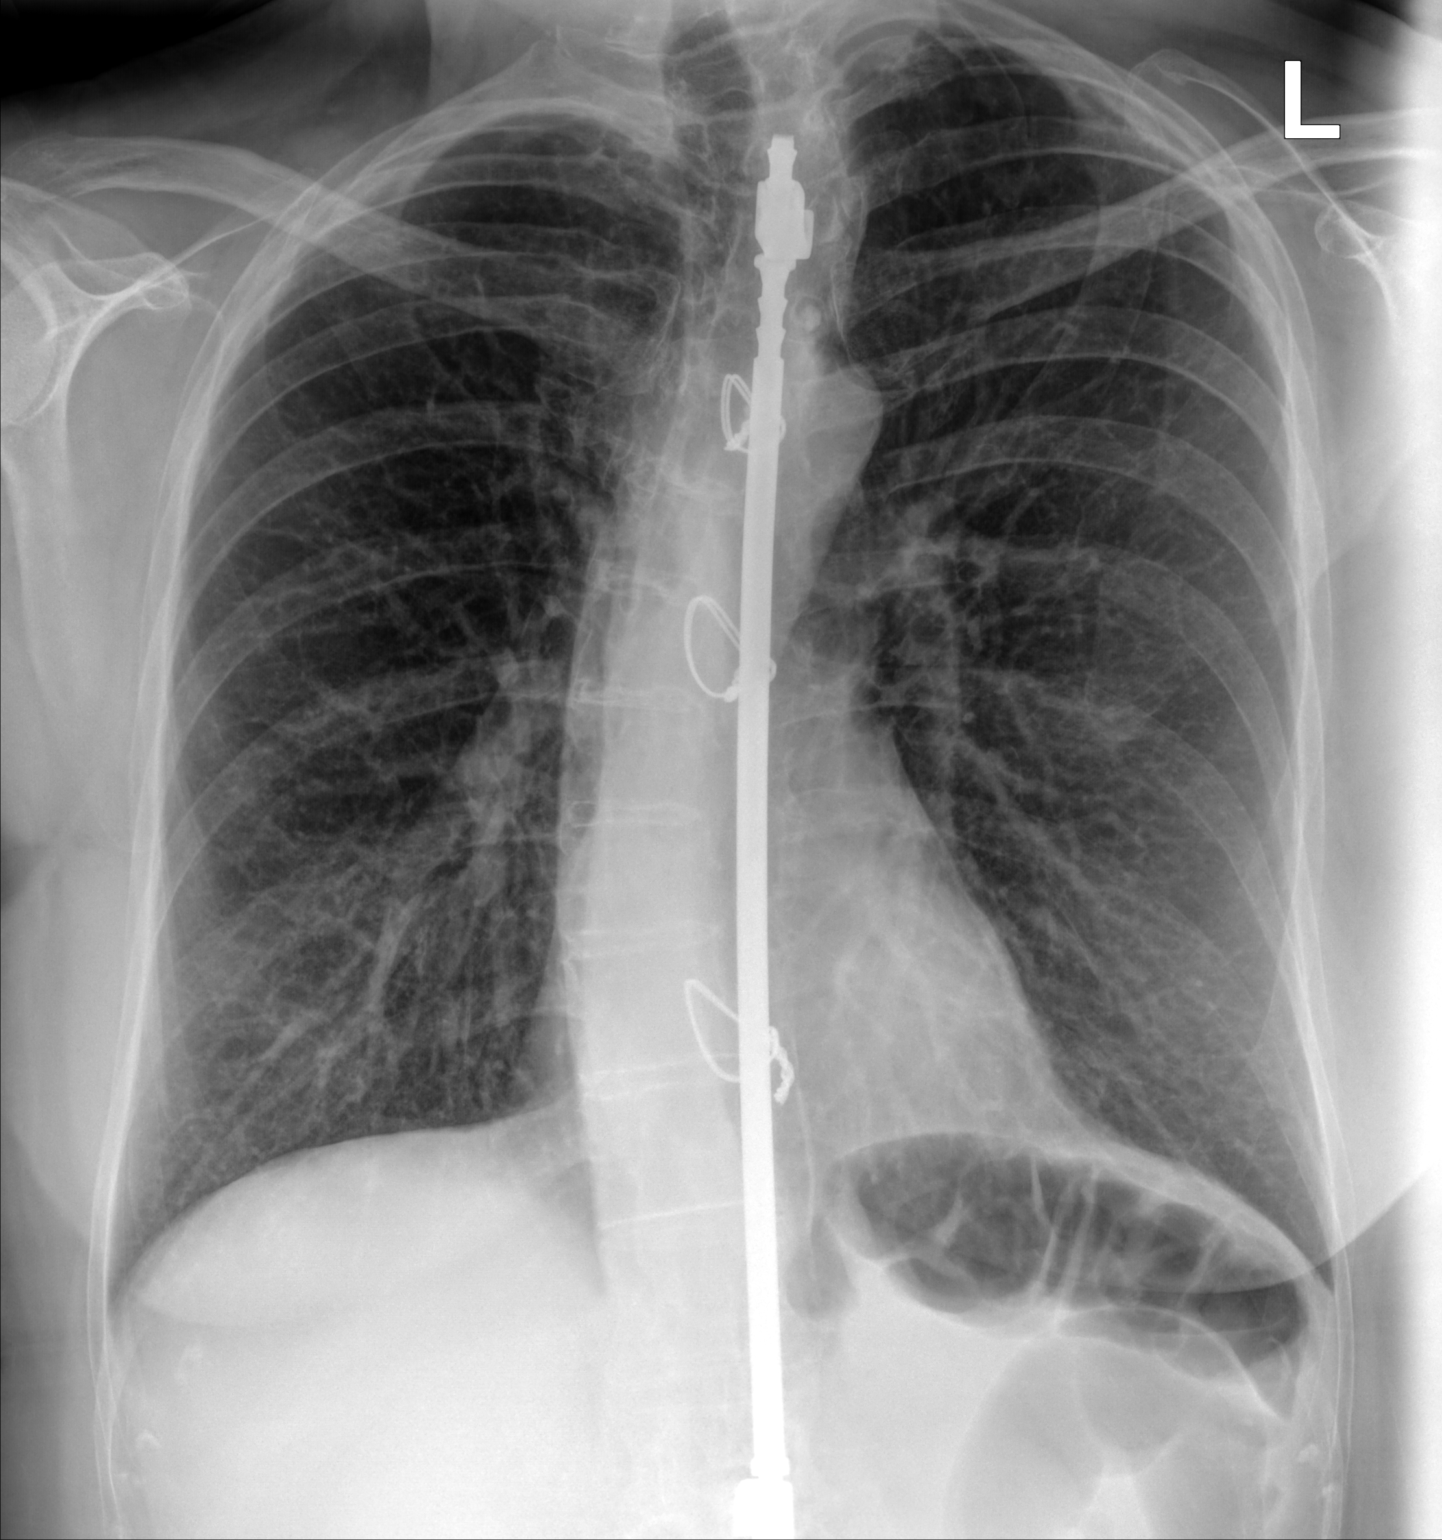

[chest lat]
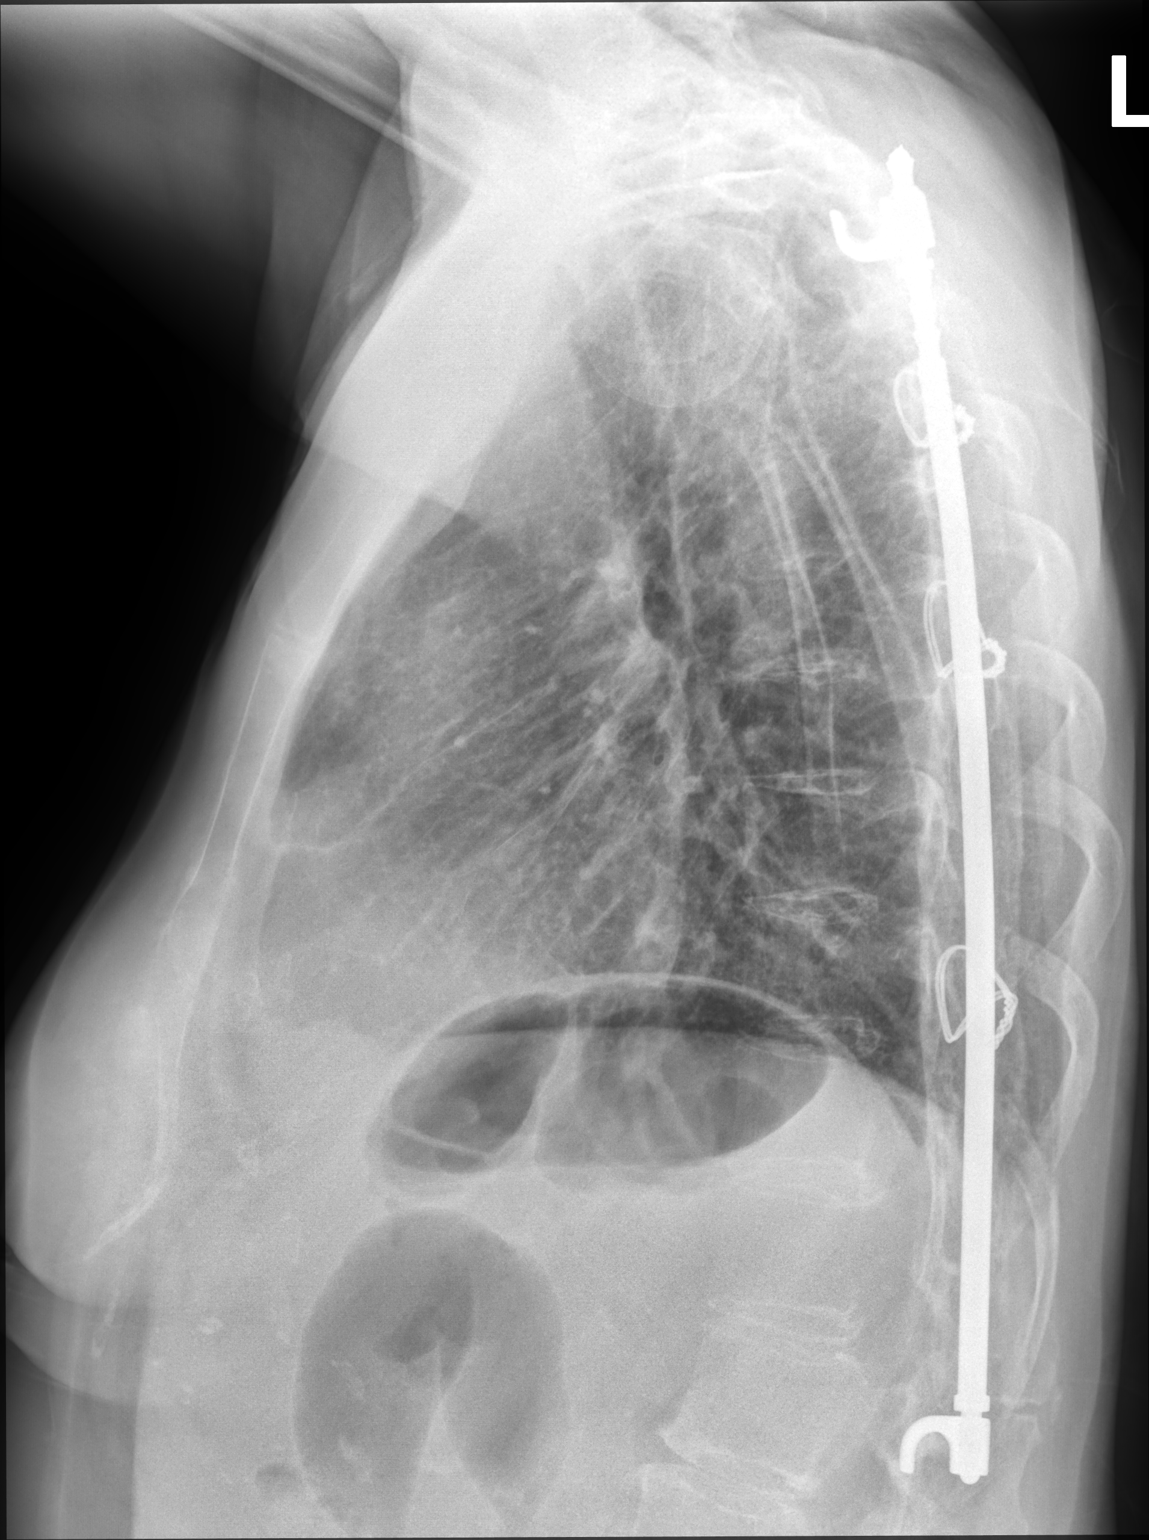

[2 of 2 positions shown; findings below may reference images not displayed]

FINDINGS: The heart and mediastinum are unremarkable. No acute abnormality of
the lungs. Rod fusion of the thoracic spine with thoracic
dextroscoliosis. No displaced fracture or other radiographic
abnormality of the lower left ribs to explain pain.
IMPRESSION: 1. No acute abnormality of the chest.
2. No displaced fracture or other radiographic abnormality of the
lower left ribs to explain pain.

## 2019-07-29 MED ORDER — NAPROXEN 500 MG PO TABS: 500.0000 mg | ORAL_TABLET | Freq: Two times a day (BID) | ORAL | 0 refills | Status: DC

## 2019-07-29 MED ORDER — ESTROGENS CONJUGATED 0.3 MG PO TABS: 0.3000 mg | ORAL_TABLET | Freq: Every day | ORAL | 5 refills | Status: DC

## 2019-07-29 NOTE — Progress Notes (Signed)
Roberta Sims is a 49 y.o. female  Chief Complaint  Patient presents with  . Breast Pain    Pt c/o lt under breast pain going on 2.5 weeks.  Radiating into her back, shoulder and rib area.  Pt also would like discuss medications.    HPI: Roberta Sims is a 49 y.o. female who complains of pain under Lt breast x 2.5 wks. Pain, at times, radiates to her back and Lt shoulder. It was a sharp pain but now more of a dull ache. No SOB but pain with deep breath. She is unsure about heart racing or palpitations - "could be a little". No n/v, abdominal pain, fever, chills, diaphoresis. Trouble getting comfortable in bed. Appetite is normal. No rash.  Pt was in [place] for 1 week lifting grandchildren, up/down stairs.   Pt notes frequent hot flashes and mood changes/irritability. She was on oral estrogen a few years ago with good improvement in symptoms. Pt wonders about restarting.  Normal mammo x 4+ years, h/o breast cysts [facility] with breast cancer in 80's  Pt is due for A1C and lipid panel. She is not fasting. Will RTO for lab appt She has not been taking cholesterol med and has not been eating as well or exercising as much.   Past Medical History:  Diagnosis Date  . GERD with stricture 03/04/2019  . Hx of sessile serrated colonic polyp 06/28/2013  . Scoliosis     Past Surgical History:  Procedure Laterality Date  . COLONOSCOPY    . ESOPHAGOGASTRODUODENOSCOPY    . harrington rod--scoliosis      Social History   Socioeconomic History  . Marital status: Single    Spouse name: Not on file  . Number of children: Not on file  . Years of education: Not on file  . Highest education level: Not on file  Occupational History  . Occupation: self-employed    Associate Professor: OTHER    Comment: Contractor  Tobacco Use  . Smoking status: Never Smoker  . Smokeless tobacco: Never Used  Substance and Sexual Activity  . Alcohol use: Yes    Comment: socially  . Drug use: Never  . Sexual  activity: Yes    Birth control/protection: Surgical  Other Topics Concern  . Not on file  Social History Narrative   Divorced, 2 sons born 1990 1995.  They are in [place].   She is a self-employed Pharmacist, community.   0-1 alcoholic beverages a day never smoker no drug use no other tobacco 1-2 caffeinated beverages weekly   Social Determinants of Health   Financial Resource Strain:   . Difficulty of Paying Living Expenses:   Food Insecurity:   . Worried About Programme researcher, broadcasting/film/video in the Last Year:   . Barista in the Last Year:   Transportation Needs:   . Freight forwarder (Medical):   Marland Kitchen Lack of Transportation (Non-Medical):   Physical Activity:   . Days of Exercise per Week:   . Minutes of Exercise per Session:   Stress:   . Feeling of Stress :   Social Connections:   . Frequency of Communication with Friends and Family:   . Frequency of Social Gatherings with Friends and Family:   . Attends Religious Services:   . Active Member of Clubs or Organizations:   . Attends Banker Meetings:   Marland Kitchen Marital Status:   Intimate Partner Violence:   . Fear of Current or Ex-Partner:   .  Emotionally Abused:   Marland Kitchen Physically Abused:   . Sexually Abused:     Family History  Problem Relation Age of Onset  . Pancreatic cancer Mother        metastasized to colon  . Alcohol abuse Brother   . Diabetes Brother   . Colon cancer Maternal Grandmother      Immunization History  Administered Date(s) Administered  . Tdap 10/29/2018    Outpatient Encounter Medications as of 07/29/2019  Medication Sig  . Ascorbic Acid (VITAMIN C) 100 MG tablet Take 100 mg by mouth daily.  . Cetirizine HCl (ZYRTEC PO) Take by mouth.  . Cholecalciferol (VITAMIN D-1000 MAX ST) 25 MCG (1000 UT) tablet Take 1 capsule by mouth daily.  . cyclobenzaprine (FLEXERIL) 10 MG tablet Take 1 tablet (10 mg total) by mouth at bedtime.  . Omega-3 Fatty Acids (FISH OIL OMEGA-3  PO) Take 1 capsule by mouth daily.  . Pediatric Multivitamins-Iron (FLINTSTONES COMPLETE) 18 MG CHEW Chew by mouth.  . sodium chloride (OCEAN) 0.65 % SOLN nasal spray Place 1 spray into both nostrils as needed for congestion.  Marland Kitchen zinc gluconate 50 MG tablet Take 50 mg by mouth daily.  Marland Kitchen zolpidem (AMBIEN) 5 MG tablet Take 1 tablet (5 mg total) by mouth at bedtime as needed for sleep.  . [DISCONTINUED] fenofibrate (TRICOR) 145 MG tablet Take 1 tablet (145 mg total) by mouth daily. (Patient not taking: Reported on 05/05/2019)  . [DISCONTINUED] omeprazole (PRILOSEC) 40 MG capsule Take 1 capsule (40 mg total) by mouth daily before breakfast. (Patient not taking: Reported on 05/05/2019)  . [DISCONTINUED] predniSONE (DELTASONE) 20 MG tablet 3 tab po x 3 days, 2 tabs po x 3 days, 1 tab po x 3 day, 1/2 tab po x 3 days   No facility-administered encounter medications on file as of 07/29/2019.     ROS: Pertinent positives and negatives noted in HPI. Remainder of ROS non-contributory   No Known Allergies  BP 110/88 (BP Location: Right Arm, Patient Position: Sitting, Cuff Size: Normal)   Pulse 98   Temp 97.8 F (36.6 C) (Temporal)   Ht 5\' 9"  (1.753 m)   Wt 159 lb (72.1 kg)   SpO2 99%   BMI 23.48 kg/m    BP Readings from Last 3 Encounters:  07/29/19 110/88  05/05/19 110/84  03/04/19 115/65   Pulse Readings from Last 3 Encounters:  07/29/19 98  05/05/19 (!) 109  03/04/19 77     Physical Exam  Constitutional: She appears well-developed and well-nourished. No distress.  Cardiovascular: Normal rate and regular rhythm.  Pulmonary/Chest: Effort normal and breath sounds normal. She has no decreased breath sounds. She has no wheezes. She has no rhonchi. She exhibits tenderness (TTP over Lt anterior and lateral chest; normal lung exam, no skin changes). She exhibits no deformity and no swelling. Left breast exhibits no mass, no nipple discharge, no skin change and no tenderness.  Psychiatric: She  has a normal mood and affect.     A/P:  1. Impaired fasting glucose - Hemoglobin A1c; Future  2. Hypertriglyceridemia, familial - Lipid panel; Future  3. Musculoskeletal chest pain - heating pad 2-3x/day Rx: - naproxen (NAPROSYN) 500 MG tablet; Take 1 tablet (500 mg total) by mouth 2 (two) times daily with a meal.  Dispense: 60 tablet; Refill: 0 - pt is concerned about would like xray - DG Chest 2 View; Future - DG Ribs Unilateral Left; Future  4. Vasomotor symptoms due to menopause Rx: - estrogens,  conjugated, (PREMARIN) 0.3 MG tablet; Take 1 tablet (0.3 mg total) by mouth daily.  Dispense: 30 tablet; Refill: 5 - f/u in 3 mo or sooner PRN Discussed plan and reviewed medications with patient, including risks, benefits, and potential side effects. Pt expressed understand. All questions answered.   This visit occurred during the SARS-CoV-2 public health emergency.  Safety protocols were in place, including screening questions prior to the visit, additional usage of staff PPE, and extensive cleaning of exam room while observing appropriate contact time as indicated for disinfecting solutions.

## 2019-07-29 NOTE — Patient Instructions (Addendum)
Heating pad 2-3x/day 15-20 min on then off Naproxen 500mg  1 tab 2x/day with food x 7-10 days

## 2019-08-02 ENCOUNTER — Other Ambulatory Visit (INDEPENDENT_AMBULATORY_CARE_PROVIDER_SITE_OTHER): Payer: BC Managed Care – PPO

## 2019-08-02 ENCOUNTER — Other Ambulatory Visit: Payer: Self-pay

## 2019-08-02 DIAGNOSIS — E781 Pure hyperglyceridemia: Secondary | ICD-10-CM

## 2019-08-02 DIAGNOSIS — R7301 Impaired fasting glucose: Secondary | ICD-10-CM

## 2019-08-02 LAB — LIPID PANEL
Cholesterol: 272 mg/dL — ABNORMAL HIGH (ref 0–200)
HDL: 44.2 mg/dL (ref >39.00)
NonHDL: 227.65
Total CHOL/HDL Ratio: 6
Triglycerides: 304 mg/dL — ABNORMAL HIGH (ref 0.0–149.0)
VLDL: 60.8 mg/dL — ABNORMAL HIGH (ref 0.0–40.0)

## 2019-08-02 LAB — HEMOGLOBIN A1C: Hgb A1c MFr Bld: 5.8 % (ref 4.6–6.5)

## 2019-08-02 LAB — LDL CHOLESTEROL, DIRECT: Direct LDL: 175 mg/dL

## 2019-08-03 ENCOUNTER — Encounter: Payer: Self-pay | Admitting: Family Medicine

## 2019-08-03 DIAGNOSIS — E781 Pure hyperglyceridemia: Secondary | ICD-10-CM

## 2019-08-04 MED ORDER — FENOFIBRATE 145 MG PO TABS: 145.0000 mg | ORAL_TABLET | Freq: Every day | ORAL | 1 refills | Status: DC

## 2019-08-09 ENCOUNTER — Telehealth: Payer: Self-pay | Admitting: Internal Medicine

## 2019-08-09 ENCOUNTER — Telehealth: Payer: Self-pay | Admitting: *Deleted

## 2019-08-09 NOTE — Telephone Encounter (Signed)
Pt was prescribed omeprazole but her insurance said she could take it OTC. Advised her to take Prilosec.

## 2019-08-09 NOTE — Telephone Encounter (Signed)
Patient called states she was prescribed Omeprazole and per insurance she is able to get something OTC. Patient is wanting some direction on what to get that is similar.

## 2019-08-18 DIAGNOSIS — S61501A Unspecified open wound of right wrist, initial encounter: Secondary | ICD-10-CM | POA: Diagnosis not present

## 2019-08-18 DIAGNOSIS — S61401A Unspecified open wound of right hand, initial encounter: Secondary | ICD-10-CM | POA: Diagnosis not present

## 2019-08-20 NOTE — Telephone Encounter (Signed)
Patient has wound care and stitch questions. She had laceration on her hand Wednesday night, went to urgent care and received stitches. She has concerns about how the stitches look and how to take care of them.

## 2019-08-25 NOTE — Telephone Encounter (Signed)
Left message on voicemail to call office.  

## 2019-08-26 DIAGNOSIS — S61401D Unspecified open wound of right hand, subsequent encounter: Secondary | ICD-10-CM | POA: Diagnosis not present

## 2019-10-10 DIAGNOSIS — J020 Streptococcal pharyngitis: Secondary | ICD-10-CM | POA: Diagnosis not present

## 2019-10-10 DIAGNOSIS — U071 COVID-19: Secondary | ICD-10-CM | POA: Diagnosis not present

## 2019-10-15 ENCOUNTER — Telehealth: Payer: Self-pay | Admitting: Family Medicine

## 2019-10-15 NOTE — Telephone Encounter (Signed)
Patient states she is recovering from covid again. The last time this happened, Dr. Salena Saner recommended a combination of vitamins to help bring her senses of taste and smell back and they worked. The patient would like a reminder what that combination of vitamins was.

## 2019-10-19 NOTE — Telephone Encounter (Signed)
Dr. Salena Saner please advise pt on vitamins to help being her sense of taste and smell back after recovering from covid.

## 2019-10-20 NOTE — Telephone Encounter (Signed)
Left voicemail for pt to return our call about her vitamins or see the my chart message I sent to her with Dr. Renaye Rakers recommendations.

## 2019-10-20 NOTE — Telephone Encounter (Signed)
There is no guarantee these will help with recovery of taste and smell but they cannot hurt. Vit C 500-1000mg  daily Vit D 1000-2000IU daily Zinc 50mg  daily

## 2019-11-07 ENCOUNTER — Other Ambulatory Visit: Payer: Self-pay | Admitting: Family Medicine

## 2019-11-07 DIAGNOSIS — E781 Pure hyperglyceridemia: Secondary | ICD-10-CM

## 2019-12-07 DIAGNOSIS — D1801 Hemangioma of skin and subcutaneous tissue: Secondary | ICD-10-CM | POA: Diagnosis not present

## 2019-12-07 DIAGNOSIS — L820 Inflamed seborrheic keratosis: Secondary | ICD-10-CM | POA: Diagnosis not present

## 2019-12-07 DIAGNOSIS — L821 Other seborrheic keratosis: Secondary | ICD-10-CM | POA: Diagnosis not present

## 2019-12-07 DIAGNOSIS — D225 Melanocytic nevi of trunk: Secondary | ICD-10-CM | POA: Diagnosis not present

## 2019-12-07 DIAGNOSIS — L814 Other melanin hyperpigmentation: Secondary | ICD-10-CM | POA: Diagnosis not present

## 2019-12-07 DIAGNOSIS — L570 Actinic keratosis: Secondary | ICD-10-CM | POA: Diagnosis not present

## 2019-12-24 ENCOUNTER — Ambulatory Visit (INDEPENDENT_AMBULATORY_CARE_PROVIDER_SITE_OTHER): Payer: BC Managed Care – PPO | Admitting: Family Medicine

## 2019-12-24 ENCOUNTER — Other Ambulatory Visit: Payer: Self-pay

## 2019-12-24 ENCOUNTER — Telehealth: Payer: Self-pay | Admitting: Family Medicine

## 2019-12-24 ENCOUNTER — Encounter: Payer: Self-pay | Admitting: Family Medicine

## 2019-12-24 VITALS — BP 130/84 | HR 83 | Temp 98.0°F | Ht 69.0 in | Wt 161.0 lb

## 2019-12-24 DIAGNOSIS — R399 Unspecified symptoms and signs involving the genitourinary system: Secondary | ICD-10-CM

## 2019-12-24 LAB — POCT URINALYSIS DIPSTICK
Bilirubin, UA: NEGATIVE
Blood, UA: POSITIVE
Glucose, UA: NEGATIVE
Ketones, UA: NEGATIVE
Leukocytes, UA: NEGATIVE
Nitrite, UA: NEGATIVE
Protein, UA: NEGATIVE
Spec Grav, UA: >=1.03 — AB (ref 1.010–1.025)
Urobilinogen, UA: 0.2 E.U./dL
pH, UA: 6 (ref 5.0–8.0)

## 2019-12-24 MED ORDER — NITROFURANTOIN MONOHYD MACRO 100 MG PO CAPS: 100.0000 mg | ORAL_CAPSULE | Freq: Two times a day (BID) | ORAL | 0 refills | Status: DC

## 2019-12-24 NOTE — Telephone Encounter (Signed)
Pt stating that she has had some mild abdominal discomfort for a bit more than 24 hrs. In the past 2-3 hours she has begun having a lot more discomfort and feels she may have a bladder infection. No closeby availability within [facility]. Pt is requesting to come in for a urine sample drop off. Willing to do a mychart. I'm not sure if Sat clinic will be able to assist. Please advise.

## 2019-12-24 NOTE — Telephone Encounter (Signed)
Per DR C, patient is being double booked at 4 pm today.  Patient aware.  Dm/cma

## 2019-12-24 NOTE — Progress Notes (Signed)
Roberta Sims is a 49 y.o. female  Chief Complaint  Patient presents with  . Acute Visit    low abdominal pain with cramping, urinating urgency x 2 days.      HPI: Roberta Sims is a 49 y.o. female 2 day h/o urinary urgency and low abdominal cramping. Symptoms worse today compared to yesterday.  No dysuria, frequency. No gross hematuria.  No fever, chills. No n/v. B/L lower back pain. No flank pain.  Pt does endorse constipation and wonders if this is contributing to/causing above symptoms.  She has been drinking more water today.  Component     Latest Ref Rng & Units 12/24/2019  Color, UA      pale yellow  Clarity, UA      cloudy  Glucose     Negative Negative  Bilirubin, UA      negative  Ketones, UA      negative  Specific Gravity, UA     1.010 - 1.025 >=1.030 (A)  RBC, UA      positive  pH, UA     5.0 - 8.0 6.0  Protein,UA     Negative Negative  Urobilinogen, UA     0.2 or 1.0 E.U./dL 0.2  Nitrite, UA      negative  Leukocytes,UA     Negative Negative     Past Medical History:  Diagnosis Date  . GERD with stricture 03/04/2019  . Hx of sessile serrated colonic polyp 06/28/2013  . Scoliosis     Past Surgical History:  Procedure Laterality Date  . COLONOSCOPY    . ESOPHAGOGASTRODUODENOSCOPY    . harrington rod--scoliosis      Social History   Socioeconomic History  . Marital status: Single    Spouse name: Not on file  . Number of children: Not on file  . Years of education: Not on file  . Highest education level: Not on file  Occupational History  . Occupation: self-employed    Associate Professor: OTHER    Comment: Contractor  Tobacco Use  . Smoking status: Never Smoker  . Smokeless tobacco: Never Used  Vaping Use  . Vaping Use: Never used  Substance and Sexual Activity  . Alcohol use: Yes    Comment: socially  . Drug use: Never  . Sexual activity: Yes    Birth control/protection: Surgical  Other Topics Concern  . Not on file    Social History Narrative   Divorced, 2 sons born 1990 1995.  They are in [place].   She is a self-employed Pharmacist, community.   0-1 alcoholic beverages a day never smoker no drug use no other tobacco 1-2 caffeinated beverages weekly   Social Determinants of Health   Financial Resource Strain:   . Difficulty of Paying Living Expenses: Not on file  Food Insecurity:   . Worried About Programme researcher, broadcasting/film/video in the Last Year: Not on file  . Ran Out of Food in the Last Year: Not on file  Transportation Needs:   . Lack of Transportation (Medical): Not on file  . Lack of Transportation (Non-Medical): Not on file  Physical Activity:   . Days of Exercise per Week: Not on file  . Minutes of Exercise per Session: Not on file  Stress:   . Feeling of Stress : Not on file  Social Connections:   . Frequency of Communication with Friends and Family: Not on file  . Frequency of Social Gatherings with Friends and Family: Not  on file  . Attends Religious Services: Not on file  . Active Member of Clubs or Organizations: Not on file  . Attends Banker Meetings: Not on file  . Marital Status: Not on file  Intimate Partner Violence:   . Fear of Current or Ex-Partner: Not on file  . Emotionally Abused: Not on file  . Physically Abused: Not on file  . Sexually Abused: Not on file    Family History  Problem Relation Age of Onset  . Pancreatic cancer Mother        metastasized to colon  . Alcohol abuse Brother   . Diabetes Brother   . Colon cancer Maternal Grandmother      Immunization History  Administered Date(s) Administered  . Tdap 10/29/2018    Outpatient Encounter Medications as of 12/24/2019  Medication Sig  . Ascorbic Acid (VITAMIN C) 100 MG tablet Take 100 mg by mouth daily.  . Cetirizine HCl (ZYRTEC PO) Take by mouth.  . Cholecalciferol (VITAMIN D-1000 MAX ST) 25 MCG (1000 UT) tablet Take 1 capsule by mouth daily.  . cyclobenzaprine  (FLEXERIL) 10 MG tablet Take 1 tablet (10 mg total) by mouth at bedtime.  . fenofibrate (TRICOR) 145 MG tablet Take 1 tablet (145 mg total) by mouth daily.  . naproxen (NAPROSYN) 500 MG tablet Take 1 tablet (500 mg total) by mouth 2 (two) times daily with a meal.  . Omega-3 Fatty Acids (FISH OIL OMEGA-3 PO) Take 1 capsule by mouth daily.  Marland Kitchen zinc gluconate 50 MG tablet Take 50 mg by mouth daily.  Marland Kitchen zolpidem (AMBIEN) 5 MG tablet Take 1 tablet (5 mg total) by mouth at bedtime as needed for sleep.  Marland Kitchen estrogens, conjugated, (PREMARIN) 0.3 MG tablet Take 1 tablet (0.3 mg total) by mouth daily. (Patient not taking: Reported on 12/24/2019)  . Pediatric Multivitamins-Iron (FLINTSTONES COMPLETE) 18 MG CHEW Chew by mouth. (Patient not taking: Reported on 12/24/2019)  . sodium chloride (OCEAN) 0.65 % SOLN nasal spray Place 1 spray into both nostrils as needed for congestion. (Patient not taking: Reported on 12/24/2019)   No facility-administered encounter medications on file as of 12/24/2019.     ROS: Pertinent positives and negatives noted in HPI. Remainder of ROS non-contributory   No Known Allergies  BP 130/84   Pulse 83   Temp 98 F (36.7 C) (Temporal)   Ht 5\' 9"  (1.753 m)   Wt 161 lb (73 kg)   SpO2 94%   BMI 23.78 kg/m   Physical Exam Constitutional:      General: She is not in acute distress.    Appearance: Normal appearance. She is not ill-appearing.  Abdominal:     General: Abdomen is flat. There is no distension.     Tenderness: There is abdominal tenderness. There is no right CVA tenderness, left CVA tenderness, guarding or rebound.  Neurological:     Mental Status: She is alert and oriented to person, place, and time.  Psychiatric:        Mood and Affect: Mood normal.        Behavior: Behavior normal.      A/P:  1. UTI symptoms - POCT Urinalysis Dipstick - leuks and nitrite negative, + blood (not new for pt) - constipation could be causing increased pressure on bladder  and feeling of urinary urgency. Pt states she has experienced this in the past but current symptoms don't feel the same. She says it "feels like infection" - Urine Culture - increase water  intake - recommended 1-2 doses of miralax or other to help pt move bowels to see if symptoms improve Rx - nitrofurantoin, macrocrystal-monohydrate, (MACROBID) 100 MG capsule; Take 1 capsule (100 mg total) by mouth 2 (two) times daily.  Dispense: 14 capsule; Refill: 0 - will contact pt with urine cx once available and can stop abx if not needed Discussed plan and reviewed medications with patient, including risks, benefits, and potential side effects. Pt expressed understand. All questions answered.

## 2019-12-25 LAB — URINE CULTURE
MICRO NUMBER:: 11136890
Result:: NO GROWTH
SPECIMEN QUALITY:: ADEQUATE

## 2020-02-05 ENCOUNTER — Other Ambulatory Visit: Payer: Self-pay | Admitting: Family Medicine

## 2020-02-05 DIAGNOSIS — E781 Pure hyperglyceridemia: Secondary | ICD-10-CM

## 2020-02-07 NOTE — Telephone Encounter (Signed)
Last OV 12/24/19 Last fill 08/04/19  #90/1

## 2020-04-13 DIAGNOSIS — D485 Neoplasm of uncertain behavior of skin: Secondary | ICD-10-CM | POA: Diagnosis not present

## 2020-04-13 DIAGNOSIS — L820 Inflamed seborrheic keratosis: Secondary | ICD-10-CM | POA: Diagnosis not present

## 2020-05-05 ENCOUNTER — Other Ambulatory Visit: Payer: Self-pay | Admitting: Family Medicine

## 2020-05-05 NOTE — Telephone Encounter (Signed)
Refill request for   Omeprazole 40 mg  Rx dc 08/08/19 LOV  12/24/19 FOV  None scheduled.   Please review and advise.  Thanks.  Dm/cma

## 2020-06-28 ENCOUNTER — Ambulatory Visit: Payer: BC Managed Care – PPO | Admitting: Nurse Practitioner

## 2020-08-10 DIAGNOSIS — N3000 Acute cystitis without hematuria: Secondary | ICD-10-CM | POA: Diagnosis not present

## 2020-08-24 ENCOUNTER — Telehealth: Payer: Self-pay | Admitting: Family Medicine

## 2020-08-24 ENCOUNTER — Ambulatory Visit (INDEPENDENT_AMBULATORY_CARE_PROVIDER_SITE_OTHER): Payer: BC Managed Care – PPO | Admitting: Family Medicine

## 2020-08-24 MED ORDER — ESCITALOPRAM OXALATE 10 MG PO TABS: 10.0000 mg | ORAL_TABLET | Freq: Every day | ORAL | 0 refills | Status: DC

## 2020-08-24 NOTE — Progress Notes (Signed)
Pt cancelled appt

## 2020-08-24 NOTE — Telephone Encounter (Signed)
Pt aware.
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
## 2020-08-24 NOTE — Telephone Encounter (Signed)
Refill sent.

## 2020-08-27 ENCOUNTER — Emergency Department (HOSPITAL_BASED_OUTPATIENT_CLINIC_OR_DEPARTMENT_OTHER)
Admission: EM | Admit: 2020-08-27 | Discharge: 2020-08-28 | Disposition: A | Discharge status: Home / Self Care | Payer: BC Managed Care – PPO | Attending: Emergency Medicine | Admitting: Emergency Medicine

## 2020-08-27 ENCOUNTER — Other Ambulatory Visit: Payer: Self-pay

## 2020-08-27 ENCOUNTER — Encounter (HOSPITAL_BASED_OUTPATIENT_CLINIC_OR_DEPARTMENT_OTHER): Payer: Self-pay | Admitting: Emergency Medicine

## 2020-08-27 DIAGNOSIS — T148XXA Other injury of unspecified body region, initial encounter: Secondary | ICD-10-CM

## 2020-08-27 DIAGNOSIS — L089 Local infection of the skin and subcutaneous tissue, unspecified: Secondary | ICD-10-CM | POA: Insufficient documentation

## 2020-08-27 DIAGNOSIS — Z79899 Other long term (current) drug therapy: Secondary | ICD-10-CM | POA: Diagnosis not present

## 2020-08-27 DIAGNOSIS — T24031A Burn of unspecified degree of right lower leg, initial encounter: Secondary | ICD-10-CM | POA: Diagnosis not present

## 2020-08-27 DIAGNOSIS — T310 Burns involving less than 10% of body surface: Secondary | ICD-10-CM | POA: Diagnosis not present

## 2020-08-27 DIAGNOSIS — T24001A Burn of unspecified degree of unspecified site of right lower limb, except ankle and foot, initial encounter: Secondary | ICD-10-CM | POA: Diagnosis not present

## 2020-08-27 DIAGNOSIS — T300 Burn of unspecified body region, unspecified degree: Secondary | ICD-10-CM

## 2020-08-27 DIAGNOSIS — T24231A Burn of second degree of right lower leg, initial encounter: Secondary | ICD-10-CM | POA: Insufficient documentation

## 2020-08-27 DIAGNOSIS — X19XXXA Contact with other heat and hot substances, initial encounter: Secondary | ICD-10-CM | POA: Diagnosis not present

## 2020-08-27 MED ORDER — CEPHALEXIN 500 MG PO CAPS: 500.0000 mg | ORAL_CAPSULE | Freq: Three times a day (TID) | ORAL | 0 refills | Status: AC

## 2020-08-27 MED ORDER — CEPHALEXIN 250 MG PO CAPS
500.0000 mg | ORAL_CAPSULE | Freq: Once | ORAL | Status: AC
  Administered 2020-08-27: 500 mg via ORAL
  Filled 2020-08-27: qty 2
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
## 2020-09-06 ENCOUNTER — Ambulatory Visit (INDEPENDENT_AMBULATORY_CARE_PROVIDER_SITE_OTHER): Payer: BC Managed Care – PPO | Admitting: Family Medicine

## 2020-09-06 ENCOUNTER — Other Ambulatory Visit: Payer: Self-pay

## 2020-09-06 ENCOUNTER — Encounter: Payer: Self-pay | Admitting: Family Medicine

## 2020-09-06 VITALS — BP 102/70 | HR 88 | Temp 98.6°F | Ht 69.0 in | Wt 154.4 lb

## 2020-09-06 DIAGNOSIS — E7849 Other hyperlipidemia: Secondary | ICD-10-CM

## 2020-09-06 DIAGNOSIS — G4700 Insomnia, unspecified: Secondary | ICD-10-CM | POA: Diagnosis not present

## 2020-09-06 DIAGNOSIS — F419 Anxiety disorder, unspecified: Secondary | ICD-10-CM | POA: Diagnosis not present

## 2020-09-06 DIAGNOSIS — R350 Frequency of micturition: Secondary | ICD-10-CM | POA: Diagnosis not present

## 2020-09-06 DIAGNOSIS — R7301 Impaired fasting glucose: Secondary | ICD-10-CM

## 2020-09-06 LAB — POCT URINALYSIS DIPSTICK
Bilirubin, UA: NEGATIVE
Glucose, UA: NEGATIVE
Ketones, UA: NEGATIVE
Leukocytes, UA: NEGATIVE
Nitrite, UA: NEGATIVE
Protein, UA: POSITIVE — AB
Spec Grav, UA: >=1.03 — AB (ref 1.010–1.025)
Urobilinogen, UA: 1 E.U./dL
pH, UA: 6 (ref 5.0–8.0)

## 2020-09-06 MED ORDER — ESCITALOPRAM OXALATE 10 MG PO TABS: 10.0000 mg | ORAL_TABLET | Freq: Every day | ORAL | 1 refills | Status: DC

## 2020-09-06 MED ORDER — ZOLPIDEM TARTRATE 5 MG PO TABS: 5.0000 mg | ORAL_TABLET | Freq: Every evening | ORAL | 2 refills | Status: DC | PRN

## 2020-09-06 NOTE — Patient Instructions (Addendum)
Dr. Veto Kemps  Dr. Hillard Danker Dr. Cheryll Cockayne  Dr. Lezlie Octave  Dr. Everrett Coombe    Urology - Dr. Kasandra Knudsen  [facility]  [email or web address] [address] [place] [address] P: [phone] F: [phone] [email or web address] GYN - Dr. Belva Agee

## 2020-09-06 NOTE — Progress Notes (Signed)
Roberta Sims is a 50 y.o. female  Chief Complaint  Patient presents with   Medication Refill    Pt would like to discuss medication today, Lexapro and Ambien.  Pt explains that she had stopped the Lexapro once but recently started it back and it seems to help her. She is also having symptoms of UTI for the last 3 months, feeling like she has to urinate.   She has left a urine sample today.    HPI: Roberta Sims is a 50 y.o. female patient seen today  3 mo h/o intermittent urinary frequency, discomfort after urinating, lower abdominal discomfort. She had bladder tac done after partial hysterectomy and feels bladder has dropped. She was drinking 1 Red Bull every few days which causes her to have a sweet smell to her urine. She cut these out. No gross hematuria. No fever, chills.  She needs a refill of ambien as well as lexapro.  3. She was not able to take fenofibrate d/t causing muscle and bone pain. She is due for labs.    Past Medical History:  Diagnosis Date   GERD with stricture 03/04/2019   Hx of sessile serrated colonic polyp 06/28/2013   Scoliosis     Past Surgical History:  Procedure Laterality Date   COLONOSCOPY     ESOPHAGOGASTRODUODENOSCOPY     harrington rod--scoliosis      Social History   Socioeconomic History   Marital status: Single    Spouse name: Not on file   Number of children: Not on file   Years of education: Not on file   Highest education level: Not on file  Occupational History   Occupation: self-employed    Employer: OTHER    Comment: Contractor  Tobacco Use   Smoking status: Never   Smokeless tobacco: Never  Vaping Use   Vaping Use: Never used  Substance and Sexual Activity   Alcohol use: Yes    Comment: socially   Drug use: Never   Sexual activity: Yes    Birth control/protection: Surgical  Other Topics Concern   Not on file  Social History Narrative   Divorced, 2 sons born 1990 1995.  They are in [place].    She is a self-employed Pharmacist, community.   0-1 alcoholic beverages a day never smoker no drug use no other tobacco 1-2 caffeinated beverages weekly   Social Determinants of Health   Financial Resource Strain: Not on file  Food Insecurity: Not on file  Transportation Needs: Not on file  Physical Activity: Not on file  Stress: Not on file  Social Connections: Not on file  Intimate Partner Violence: Not on file    Family History  Problem Relation Age of Onset   Pancreatic cancer Mother        metastasized to colon   Alcohol abuse Brother    Diabetes Brother    Colon cancer Maternal Grandmother      Immunization History  Administered Date(s) Administered   Tdap 10/29/2018    Outpatient Encounter Medications as of 09/06/2020  Medication Sig   Ascorbic Acid (VITAMIN C) 100 MG tablet Take 100 mg by mouth daily.   Cetirizine HCl (ZYRTEC PO) Take by mouth.   Cholecalciferol (VITAMIN D-1000 MAX ST) 25 MCG (1000 UT) tablet Take 1 capsule by mouth daily.   cyclobenzaprine (FLEXERIL) 10 MG tablet Take 1 tablet (10 mg total) by mouth at bedtime.   escitalopram (LEXAPRO) 10 MG tablet Take 1 tablet (10 mg  total) by mouth daily.   fenofibrate (TRICOR) 145 MG tablet TAKE ONE TABLET BY MOUTH ONE TIME DAILY   naproxen (NAPROSYN) 500 MG tablet Take 1 tablet (500 mg total) by mouth 2 (two) times daily with a meal.   Omega-3 Fatty Acids (FISH OIL OMEGA-3 PO) Take 1 capsule by mouth daily.   omeprazole (PRILOSEC) 40 MG capsule TAKE ONE CAPSULE BY MOUTH ONE TIME DAILY BEFORE BREAKFAST   zinc gluconate 50 MG tablet Take 50 mg by mouth daily.   zolpidem (AMBIEN) 5 MG tablet Take 1 tablet (5 mg total) by mouth at bedtime as needed for sleep.   cephALEXin (KEFLEX) 500 MG capsule Take 1 capsule (500 mg total) by mouth 3 (three) times daily for 10 days. (Patient not taking: Reported on 09/06/2020)   nitrofurantoin, macrocrystal-monohydrate, (MACROBID) 100 MG capsule Take 1 capsule (100  mg total) by mouth 2 (two) times daily. (Patient not taking: Reported on 09/06/2020)   No facility-administered encounter medications on file as of 09/06/2020.     ROS: Pertinent positives and negatives noted in HPI. Remainder of ROS non-contributory    No Known Allergies  BP 102/70 (BP Location: Left Arm, Patient Position: Sitting, Cuff Size: Normal)   Pulse 88   Temp 98.6 F (37 C) (Temporal)   Ht 5\' 9"  (1.753 m)   Wt 154 lb 6.4 oz (70 kg)   SpO2 98%   BMI 22.80 kg/m  Wt Readings from Last 3 Encounters:  09/06/20 154 lb 6.4 oz (70 kg)  08/27/20 152 lb (68.9 kg)  12/24/19 161 lb (73 kg)   Temp Readings from Last 3 Encounters:  09/06/20 98.6 F (37 C) (Temporal)  08/27/20 98 F (36.7 C) (Oral)  12/24/19 98 F (36.7 C) (Temporal)   BP Readings from Last 3 Encounters:  09/06/20 102/70  08/27/20 (!) 149/79  12/24/19 130/84   Pulse Readings from Last 3 Encounters:  09/06/20 88  08/27/20 66  12/24/19 83     Physical Exam Constitutional:      General: She is not in acute distress.    Appearance: She is normal weight. She is not ill-appearing.  Neurological:     Mental Status: She is alert and oriented to person, place, and time.  Psychiatric:        Mood and Affect: Mood normal.        Behavior: Behavior normal.     A/P:  1. Urinary frequency - POCT Urinalysis Dipstick - Ambulatory referral to Urology  2. Anxiety - improved on lexapro Refill: - escitalopram (LEXAPRO) 10 MG tablet; Take 1 tablet (10 mg total) by mouth daily.  Dispense: 90 tablet; Refill: 1  3. Insomnia, unspecified type - stable, refill Refill: - zolpidem (AMBIEN) 5 MG tablet; Take 1 tablet (5 mg total) by mouth at bedtime as needed for sleep.  Dispense: 30 tablet; Refill: 2  4. Essential familial hyperlipidemia - Lipid panel; Future  5. Impaired fasting glucose - Hemoglobin A1c; Future   This visit occurred during the SARS-CoV-2 public health emergency.  Safety protocols were  in place, including screening questions prior to the visit, additional usage of staff PPE, and extensive cleaning of exam room while observing appropriate contact time as indicated for disinfecting solutions.

## 2020-09-12 ENCOUNTER — Other Ambulatory Visit: Payer: Self-pay

## 2020-09-12 ENCOUNTER — Other Ambulatory Visit (INDEPENDENT_AMBULATORY_CARE_PROVIDER_SITE_OTHER): Payer: BC Managed Care – PPO

## 2020-09-12 DIAGNOSIS — R7301 Impaired fasting glucose: Secondary | ICD-10-CM | POA: Diagnosis not present

## 2020-09-12 DIAGNOSIS — E7849 Other hyperlipidemia: Secondary | ICD-10-CM | POA: Diagnosis not present

## 2020-09-12 LAB — LIPID PANEL
Cholesterol: 323 mg/dL — ABNORMAL HIGH (ref 0–200)
HDL: 48.5 mg/dL (ref >39.00)
Total CHOL/HDL Ratio: 7
Triglycerides: 403 mg/dL — ABNORMAL HIGH (ref 0.0–149.0)

## 2020-09-12 LAB — HEMOGLOBIN A1C: Hgb A1c MFr Bld: 6 % (ref 4.6–6.5)

## 2020-09-12 LAB — LDL CHOLESTEROL, DIRECT: Direct LDL: 207 mg/dL

## 2020-09-13 ENCOUNTER — Encounter: Payer: Self-pay | Admitting: Family Medicine

## 2020-09-13 DIAGNOSIS — E7849 Other hyperlipidemia: Secondary | ICD-10-CM

## 2020-09-13 DIAGNOSIS — R7301 Impaired fasting glucose: Secondary | ICD-10-CM

## 2020-09-14 MED ORDER — ROSUVASTATIN CALCIUM 20 MG PO TABS: 20.0000 mg | ORAL_TABLET | Freq: Every day | ORAL | 1 refills | Status: DC

## 2020-09-14 MED ORDER — ICOSAPENT ETHYL 1 G PO CAPS
2.0000 g | ORAL_CAPSULE | Freq: Two times a day (BID) | ORAL | 1 refills | Status: DC
Start: 2020-09-14 — End: 2021-11-07

## 2020-09-15 ENCOUNTER — Telehealth: Payer: Self-pay | Admitting: Family Medicine

## 2020-09-15 NOTE — Telephone Encounter (Signed)
[employer] called and said that pt was worried because she is going out of town early next week and that the icosapent Ethyl (VASCEPA) 1 g capsule is requiring prior auth, they wanted to know if we can try an expedite the process

## 2020-10-27 ENCOUNTER — Other Ambulatory Visit: Payer: Self-pay

## 2020-10-27 ENCOUNTER — Ambulatory Visit
Admission: RE | Admit: 2020-10-27 | Discharge: 2020-10-27 | Disposition: A | Discharge status: Home / Self Care | Payer: BC Managed Care – PPO | Source: Ambulatory Visit | Attending: Family | Admitting: Family

## 2020-10-27 ENCOUNTER — Ambulatory Visit (INDEPENDENT_AMBULATORY_CARE_PROVIDER_SITE_OTHER): Payer: BC Managed Care – PPO | Admitting: Family

## 2020-10-27 ENCOUNTER — Other Ambulatory Visit: Payer: Self-pay | Admitting: Family

## 2020-10-27 VITALS — BP 140/90 | HR 87 | Temp 98.2°F | Ht 69.0 in | Wt 157.0 lb

## 2020-10-27 DIAGNOSIS — M549 Dorsalgia, unspecified: Secondary | ICD-10-CM

## 2020-10-27 DIAGNOSIS — M5442 Lumbago with sciatica, left side: Secondary | ICD-10-CM

## 2020-10-27 DIAGNOSIS — M5441 Lumbago with sciatica, right side: Secondary | ICD-10-CM

## 2020-10-27 DIAGNOSIS — M545 Low back pain, unspecified: Secondary | ICD-10-CM | POA: Diagnosis not present

## 2020-10-27 DIAGNOSIS — M25552 Pain in left hip: Secondary | ICD-10-CM | POA: Diagnosis not present

## 2020-10-27 IMAGING — CR DG LUMBAR SPINE COMPLETE 4+V
5 series · 5 of 5 positions shown · non-contrast
Comparison: Chest x-ray [DATE]

CLINICAL DATA: Low back pain

EXAM:
LUMBAR SPINE - COMPLETE 4+ VIEW

[w lumbar spine ap]
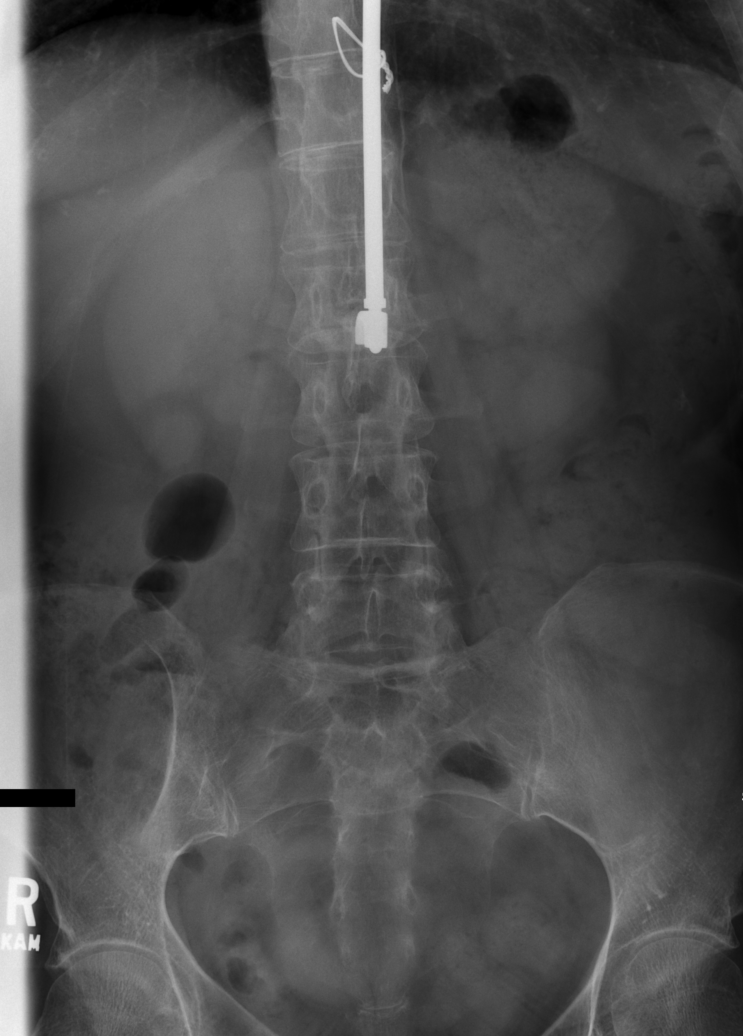

[w lumbar spine obl (1 of 2)]
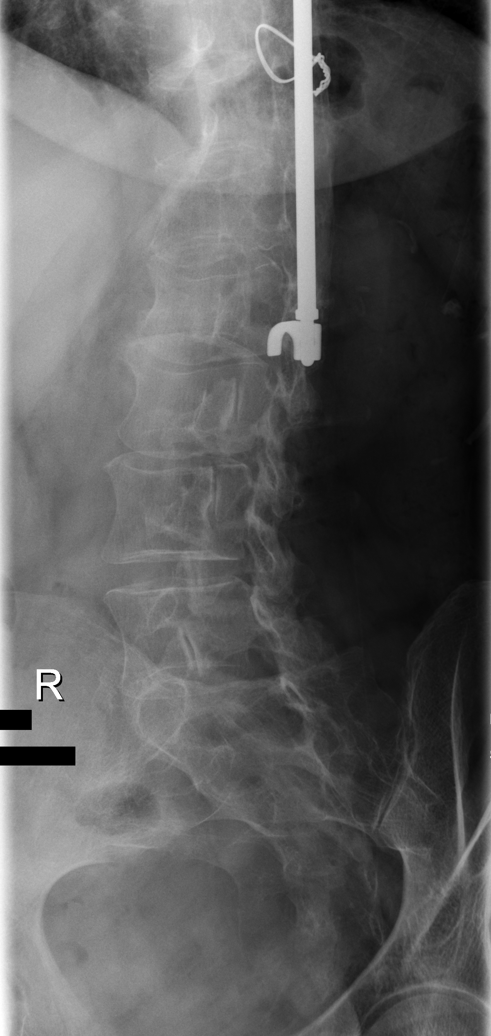

[w lumbar spine obl (2 of 2)]
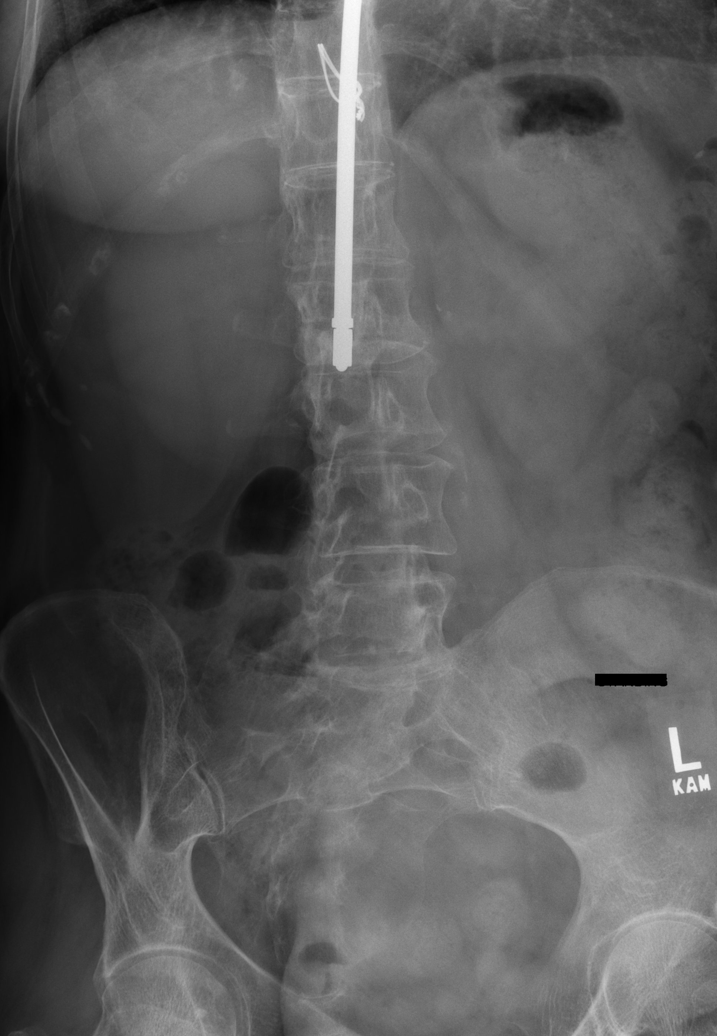

[w lumbar spine lat]
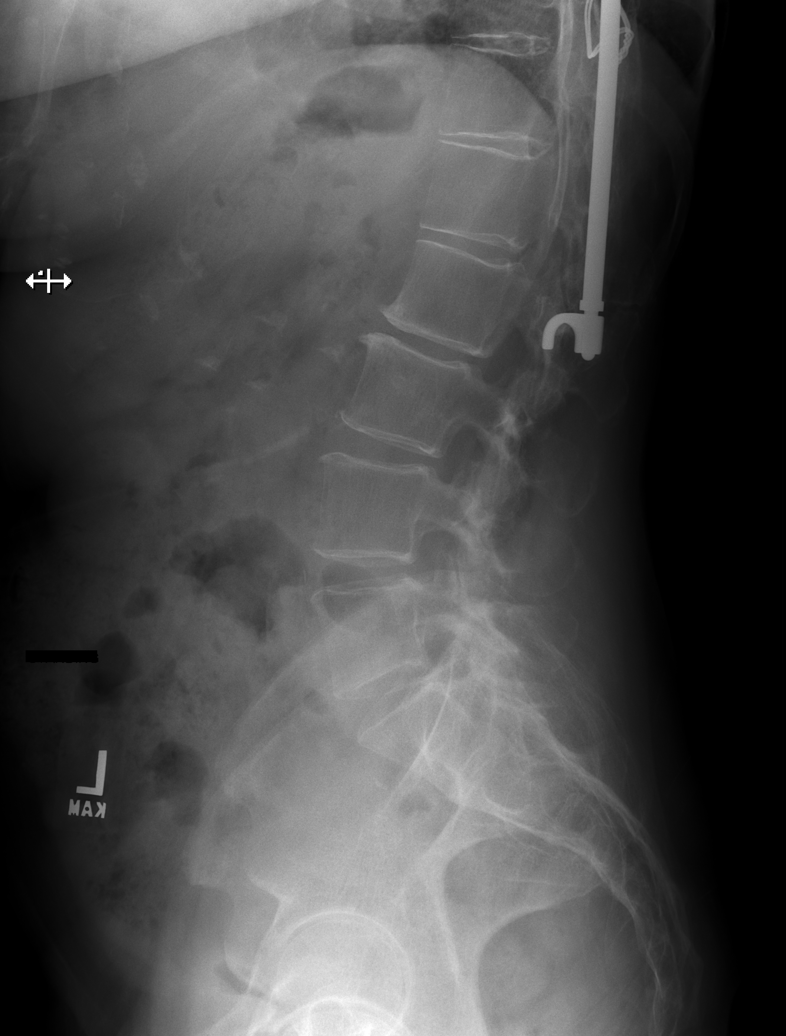

[w lumbar l-5 s-1 spot]
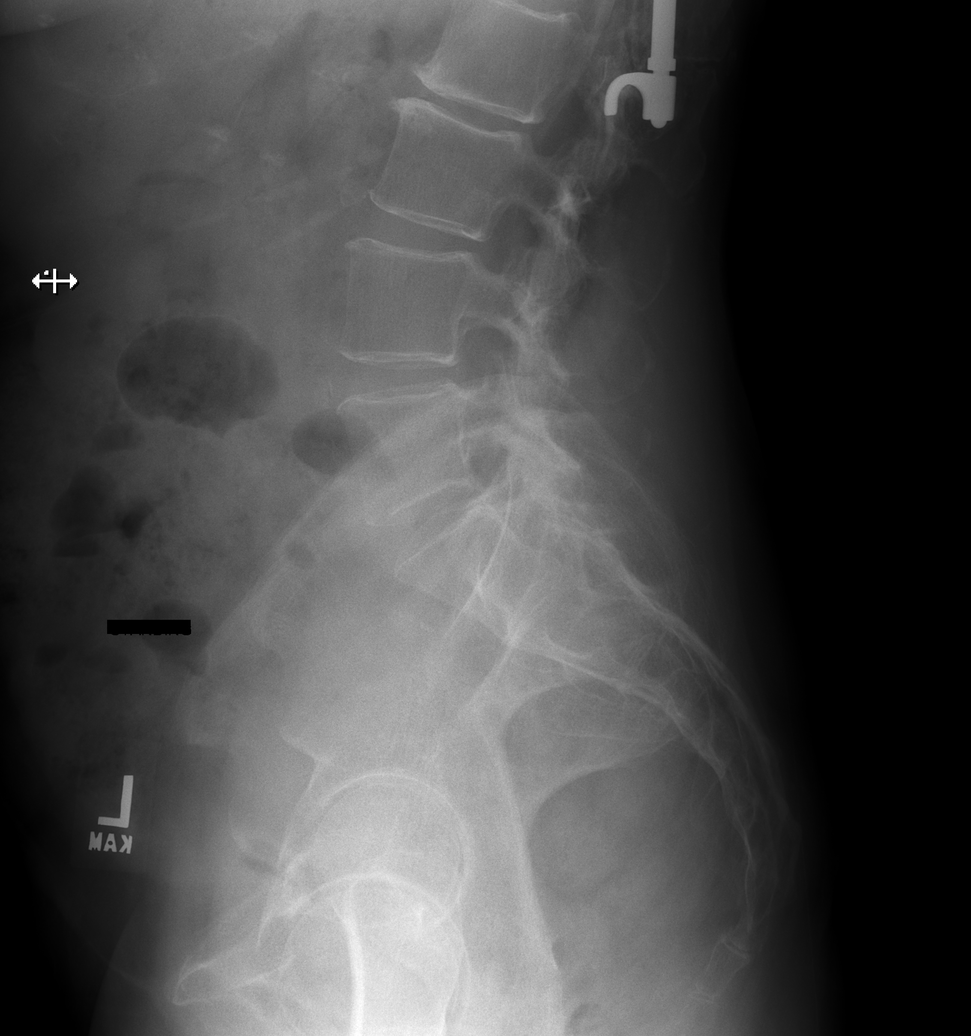

[5 of 5 positions shown; findings below may reference images not displayed]

FINDINGS: Incompletely visualized spinal rod. Lumbar alignment within normal
limits. Vertebral body heights are maintained. Mild disc space
narrowing at L3-L4 and more moderate disc space narrowing at L2-L3
with osteophyte. Mild facet degenerative changes of the lower lumbar
spine
IMPRESSION: Degenerative changes.  No acute osseous abnormality

## 2020-10-27 MED ORDER — CYCLOBENZAPRINE HCL 10 MG PO TABS: 10.0000 mg | ORAL_TABLET | Freq: Three times a day (TID) | ORAL | 0 refills | Status: DC | PRN

## 2020-10-27 MED ORDER — PREDNISONE 20 MG PO TABS: ORAL_TABLET | ORAL | 0 refills | Status: DC

## 2020-10-27 MED ORDER — METHYLPREDNISOLONE ACETATE 40 MG/ML IJ SUSP
40.0000 mg | Freq: Once | INTRAMUSCULAR | Status: AC
  Administered 2020-10-27: 40 mg via INTRAMUSCULAR
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
## 2020-11-13 ENCOUNTER — Telehealth: Payer: Self-pay

## 2020-11-13 NOTE — Telephone Encounter (Signed)
Please advise 

## 2020-11-13 NOTE — Telephone Encounter (Signed)
Patient is calling back to follow up with Vernona Rieger from her 09/02 visit. She states that her back pain has gotten worse and does not know what to do next since she is in so much pain. She used to see Dr. Barron Alvine but saw Vernona Rieger for her back pain and was told to contact her back if her pain worsen. Please advise.

## 2020-11-14 ENCOUNTER — Other Ambulatory Visit: Payer: Self-pay

## 2020-11-14 ENCOUNTER — Other Ambulatory Visit: Payer: Self-pay | Admitting: Family

## 2020-11-14 ENCOUNTER — Ambulatory Visit (INDEPENDENT_AMBULATORY_CARE_PROVIDER_SITE_OTHER): Payer: BC Managed Care – PPO | Admitting: Family Medicine

## 2020-11-14 VITALS — Ht 69.0 in | Wt 148.0 lb

## 2020-11-14 DIAGNOSIS — Z8739 Personal history of other diseases of the musculoskeletal system and connective tissue: Secondary | ICD-10-CM

## 2020-11-14 DIAGNOSIS — M5416 Radiculopathy, lumbar region: Secondary | ICD-10-CM | POA: Insufficient documentation

## 2020-11-14 DIAGNOSIS — M545 Low back pain, unspecified: Secondary | ICD-10-CM

## 2020-11-14 DIAGNOSIS — S39012A Strain of muscle, fascia and tendon of lower back, initial encounter: Secondary | ICD-10-CM | POA: Diagnosis not present

## 2020-11-14 DIAGNOSIS — G5702 Lesion of sciatic nerve, left lower limb: Secondary | ICD-10-CM | POA: Insufficient documentation

## 2020-11-14 MED ORDER — MELOXICAM 7.5 MG PO TABS: 7.5000 mg | ORAL_TABLET | Freq: Two times a day (BID) | ORAL | 1 refills | Status: DC | PRN

## 2020-11-14 MED ORDER — HYDROCODONE-ACETAMINOPHEN 5-325 MG PO TABS: 1.0000 | ORAL_TABLET | Freq: Three times a day (TID) | ORAL | 0 refills | Status: DC | PRN

## 2020-11-14 MED ORDER — KETOROLAC TROMETHAMINE 30 MG/ML IJ SOLN
30.0000 mg | Freq: Once | INTRAMUSCULAR | Status: AC
  Administered 2020-11-14: 30 mg via INTRAMUSCULAR

## 2020-11-14 NOTE — Patient Instructions (Signed)
Nice to meet you Please continue heat  Please use the mobic for moderate pain  Please use the norco for severe pain.  You can consider stopping the flexeril  Please try the exercises   Please send me a message in MyChart with any questions or updates.  Please see me back in 1-2 weeks.   --Dr. Jordan Likes

## 2020-11-14 NOTE — Progress Notes (Signed)
  Roberta Sims - 50 y.o. female MRN [id number]  Date of birth: October 14, 1970  SUBJECTIVE:  Including CC & ROS.  No chief complaint on file.   Roberta Sims is a 50 y.o. female that is presenting with acute low back pain.  No radicular pain today.  Severe in nature.  Has been taking Tylenol with limited improvement.  Has a history of back surgery  Independent review of the lumbar spine x-ray from 9/2 shows facet arthropathy.  Independent review left hip x-ray shows no significant degenerative changes.   Review of Systems See HPI   HISTORY: Past Medical, Surgical, Social, and Family History Reviewed & Updated per EMR.   Pertinent Historical Findings include:  Past Medical History:  Diagnosis Date   GERD with stricture 03/04/2019   Hx of sessile serrated colonic polyp 06/28/2013   Scoliosis     Past Surgical History:  Procedure Laterality Date   COLONOSCOPY     ESOPHAGOGASTRODUODENOSCOPY     harrington rod--scoliosis      Family History  Problem Relation Age of Onset   Pancreatic cancer Mother        metastasized to colon   Alcohol abuse Brother    Diabetes Brother    Colon cancer Maternal Grandmother     Social History   Socioeconomic History   Marital status: Single    Spouse name: Not on file   Number of children: Not on file   Years of education: Not on file   Highest education level: Not on file  Occupational History   Occupation: self-employed    Employer: OTHER    Comment: Contractor  Tobacco Use   Smoking status: Never   Smokeless tobacco: Never  Vaping Use   Vaping Use: Never used  Substance and Sexual Activity   Alcohol use: Yes    Comment: socially   Drug use: Never   Sexual activity: Yes    Birth control/protection: Surgical  Other Topics Concern   Not on file  Social History Narrative   Divorced, 2 sons born 1990 1995.  They are in [place].   She is a self-employed Pharmacist, community.   0-1 alcoholic  beverages a day never smoker no drug use no other tobacco 1-2 caffeinated beverages weekly   Social Determinants of Health   Financial Resource Strain: Not on file  Food Insecurity: Not on file  Transportation Needs: Not on file  Physical Activity: Not on file  Stress: Not on file  Social Connections: Not on file  Intimate Partner Violence: Not on file     PHYSICAL EXAM:  VS: Ht 5\' 9"  (1.753 m)   Wt 148 lb (67.1 kg)   BMI 21.86 kg/m  Physical Exam Gen: NAD, alert, cooperative with exam, well-appearing      ASSESSMENT & PLAN:   Strain of lumbar region History of surgery with facet changes on imaging.  Likely related to spasm and strain given the severity of her pain.  No radicular component -Counseled on home exercise therapy and supportive care. -Mobic. -IM Toradol -Norco. -Could consider physical therapy

## 2020-11-14 NOTE — Telephone Encounter (Signed)
I have called pt and informed her of Laura's message. She now sees Dr. Jordan Likes and she stated that she is going back next week. Pt wanted to thank provider for getting her in so quickly.

## 2020-11-14 NOTE — Assessment & Plan Note (Signed)
History of surgery with facet changes on imaging.  Likely related to spasm and strain given the severity of her pain.  No radicular component -Counseled on home exercise therapy and supportive care. -Mobic. -IM Toradol -Norco. -Could consider physical therapy

## 2020-11-15 ENCOUNTER — Telehealth: Payer: Self-pay | Admitting: Family Medicine

## 2020-11-15 ENCOUNTER — Encounter: Payer: Self-pay | Admitting: Family Medicine

## 2020-11-15 NOTE — Telephone Encounter (Signed)
Patient called states the Rx is causing severe nausea & she is ask if there is something else that can be prescribed  :   HYDROcodone-acetaminophen (NORCO/VICODIN) 5-325 MG tablet [[id number]]    Order Details Dose: 1 tablet Route: Oral Frequency: Every 8 hours PRN  Dispense Quantity: 15 tablet Refills: 0        Sig: Take 1 tablet by mouth every 8 (eight) hours as needed.    --- Forwarding message to provider for review--Pt ask to be called @ [phone]  --glh

## 2020-11-23 ENCOUNTER — Encounter: Payer: Self-pay | Admitting: Family Medicine

## 2020-11-23 ENCOUNTER — Other Ambulatory Visit: Payer: Self-pay

## 2020-11-23 ENCOUNTER — Ambulatory Visit (INDEPENDENT_AMBULATORY_CARE_PROVIDER_SITE_OTHER): Payer: BC Managed Care – PPO | Admitting: Family Medicine

## 2020-11-23 VITALS — Ht 69.0 in | Wt 148.0 lb

## 2020-11-23 DIAGNOSIS — Z981 Arthrodesis status: Secondary | ICD-10-CM | POA: Diagnosis not present

## 2020-11-23 DIAGNOSIS — M5416 Radiculopathy, lumbar region: Secondary | ICD-10-CM | POA: Diagnosis not present

## 2020-11-23 MED ORDER — METHYLPREDNISOLONE ACETATE 40 MG/ML IJ SUSP
40.0000 mg | Freq: Once | INTRAMUSCULAR | Status: AC
  Administered 2020-11-23: 40 mg via INTRAMUSCULAR

## 2020-11-23 MED ORDER — KETOROLAC TROMETHAMINE 30 MG/ML IJ SOLN
30.0000 mg | Freq: Once | INTRAMUSCULAR | Status: AC
  Administered 2020-11-23: 30 mg via INTRAMUSCULAR

## 2020-11-23 NOTE — Assessment & Plan Note (Addendum)
Acutely worsening with weakness and neurologic changes on exam.  Does have a history of spinal stenosis corrective surgery from years ago. -Counseled on home exercise therapy and supportive care. -IM Toradol and Depo-Medrol. -MRI to evaluate for nerve impingement with history of scoliosis corrective surgery.

## 2020-11-23 NOTE — Progress Notes (Signed)
  Roberta Sims - 50 y.o. female MRN [id number]  Date of birth: [birth year]  SUBJECTIVE:  Including CC & ROS.  No chief complaint on file.   Roberta Sims is a 50 y.o. female that is presenting with acute worsening of her low back and radicular type pain.  The pain is severe in nature and limits her function on a daily basis.  She is also having weakness and neurological changes in the left lower leg.  Has tried medications and home therapy thus far.    Review of Systems See HPI   HISTORY: Past Medical, Surgical, Social, and Family History Reviewed & Updated per EMR.   Pertinent Historical Findings include:  Past Medical History:  Diagnosis Date   GERD with stricture 03/04/2019   Hx of sessile serrated colonic polyp 06/28/2013   Scoliosis     Past Surgical History:  Procedure Laterality Date   COLONOSCOPY     ESOPHAGOGASTRODUODENOSCOPY     harrington rod--scoliosis      Family History  Problem Relation Age of Onset   Pancreatic cancer Mother        metastasized to colon   Alcohol abuse Brother    Diabetes Brother    Colon cancer Maternal Grandmother     Social History   Socioeconomic History   Marital status: Single    Spouse name: Not on file   Number of children: Not on file   Years of education: Not on file   Highest education level: Not on file  Occupational History   Occupation: self-employed    Employer: OTHER    Comment: Contractor  Tobacco Use   Smoking status: Never   Smokeless tobacco: Never  Vaping Use   Vaping Use: Never used  Substance and Sexual Activity   Alcohol use: Yes    Comment: socially   Drug use: Never   Sexual activity: Yes    Birth control/protection: Surgical  Other Topics Concern   Not on file  Social History Narrative   Divorced, 2 sons born 1990 1995.  They are in [place].   She is a self-employed Pharmacist, community.   0-1 alcoholic beverages a day never smoker no drug use no other  tobacco 1-2 caffeinated beverages weekly   Social Determinants of Health   Financial Resource Strain: Not on file  Food Insecurity: Not on file  Transportation Needs: Not on file  Physical Activity: Not on file  Stress: Not on file  Social Connections: Not on file  Intimate Partner Violence: Not on file     PHYSICAL EXAM:  VS: Ht 5\' 9"  (1.753 m)   Wt 148 lb (67.1 kg)   BMI 21.86 kg/m  Physical Exam Gen: NAD, alert, cooperative with exam, well-appearing MSK:  Back: Limited flexion and extension. Weakness with hip flexion. Weak deep tendon reflexes at the patella and Achilles with compared to the contralateral right side. Positive straight leg raise on the left.     ASSESSMENT & PLAN:   Lumbar radiculopathy Acutely worsening with weakness and neurologic changes on exam.  Does have a history of spinal stenosis corrective surgery from years ago. -Counseled on home exercise therapy and supportive care. -IM Toradol and Depo-Medrol. -MRI to evaluate for nerve impingement with history of scoliosis corrective surgery.

## 2020-11-23 NOTE — Patient Instructions (Signed)
Good to see you °Please call [phone] to schedule the MRI   °Please send me a message in MyChart with any questions or updates.  °We'll setup a virtual visit once the MRI is resulted.  ° °--Dr. Jorden Mahl ° °

## 2020-12-10 ENCOUNTER — Ambulatory Visit
Admission: RE | Admit: 2020-12-10 | Discharge: 2020-12-10 | Disposition: A | Discharge status: Home / Self Care | Payer: BC Managed Care – PPO | Source: Ambulatory Visit | Attending: Family Medicine | Admitting: Family Medicine

## 2020-12-10 ENCOUNTER — Other Ambulatory Visit: Payer: Self-pay

## 2020-12-10 DIAGNOSIS — M5416 Radiculopathy, lumbar region: Secondary | ICD-10-CM

## 2020-12-10 DIAGNOSIS — Z981 Arthrodesis status: Secondary | ICD-10-CM

## 2020-12-10 IMAGING — MR MR LUMBAR SPINE W/O CM
4 of 5 series · 25 of 48 positions shown · non-contrast
Comparison: Lumbar spine radiographs [DATE]

CLINICAL DATA: Lower back pain radiating to left posterolateral
side, numbness and cramping in left foot and calf

EXAM:
MRI LUMBAR SPINE WITHOUT CONTRAST
TECHNIQUE: Multiplanar, multisequence MR imaging of the lumbar spine was
performed. No intravenous contrast was administered.

[Series 3: T2 · sagittal · 4.0mm · 0.53mm/px · 6 of 15 slices shown (1 of 2)]
[im 1/15]
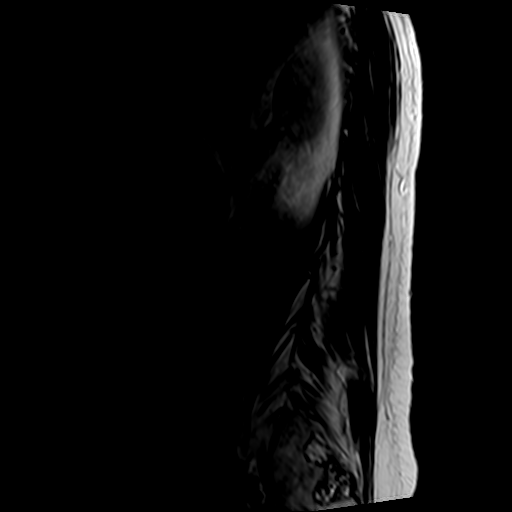
[im 3/15]
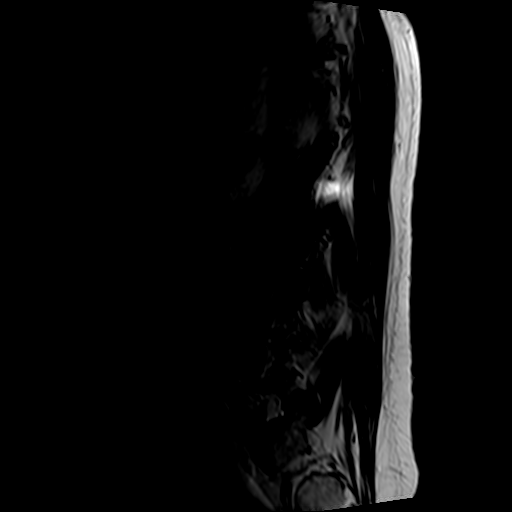
[im 6/15]
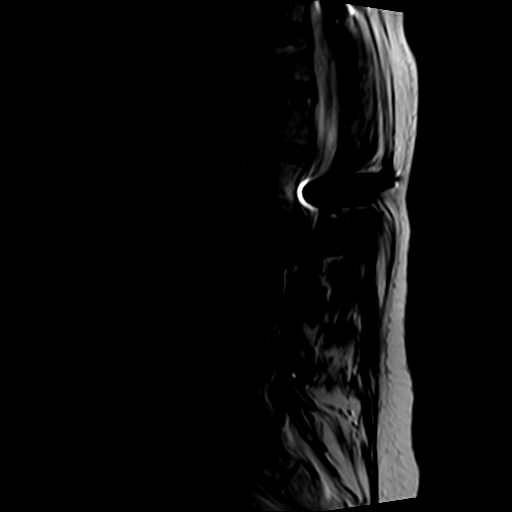
[im 9/15]
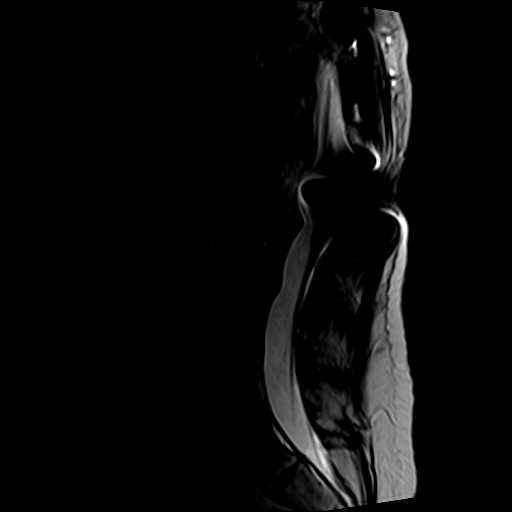
[im 12/15]
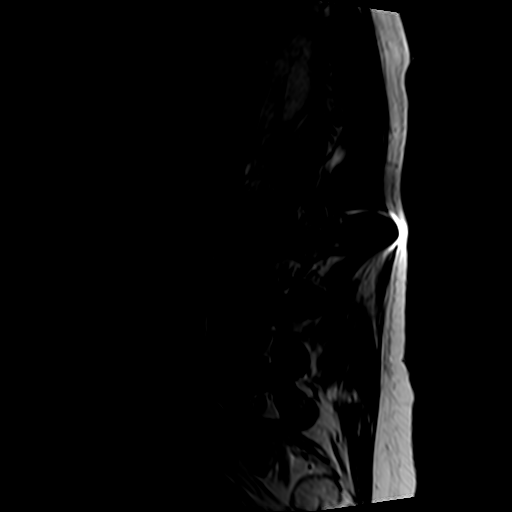
[im 15/15]
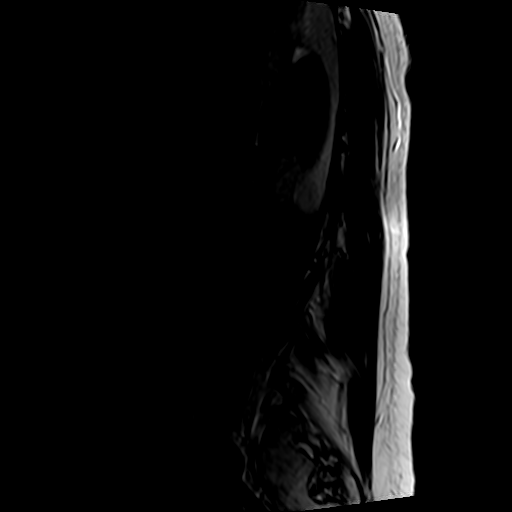

[Series 5: T1 · sagittal · 4.0mm · 0.53mm/px · 6 of 15 slices shown (1 of 2)]
[im 1/15]
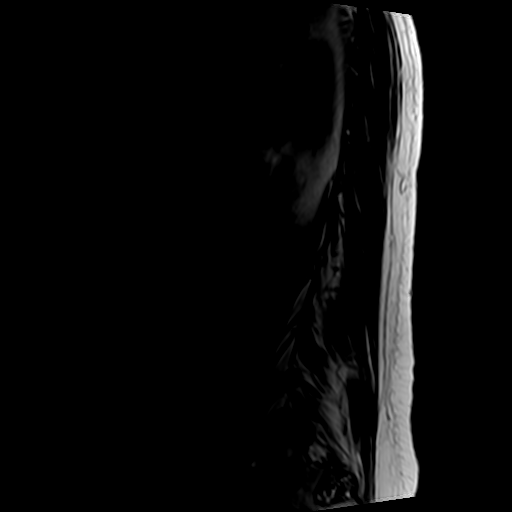
[im 3/15]
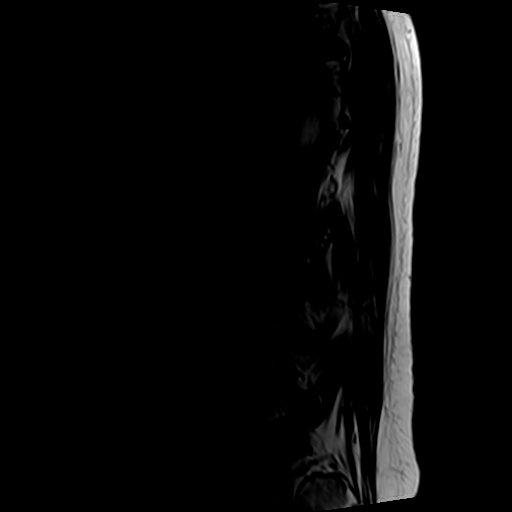
[im 6/15]
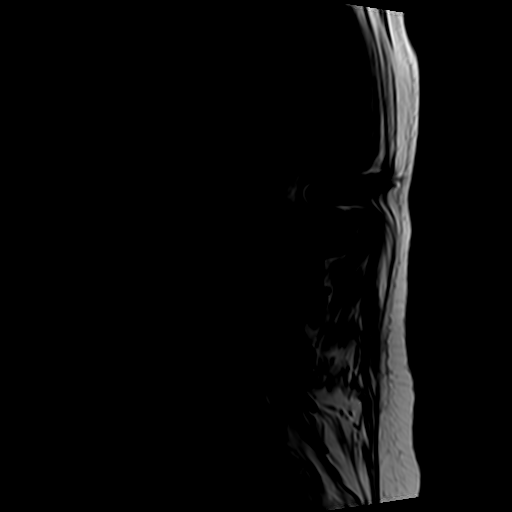
[im 9/15]
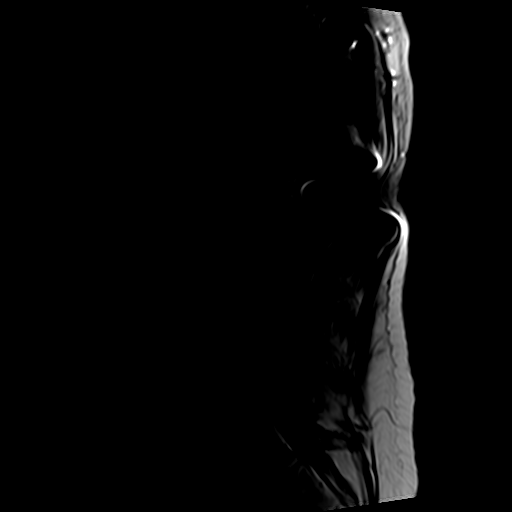
[im 12/15]
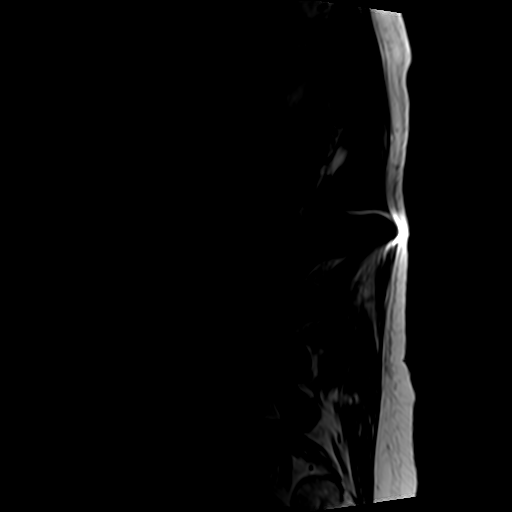
[im 15/15]
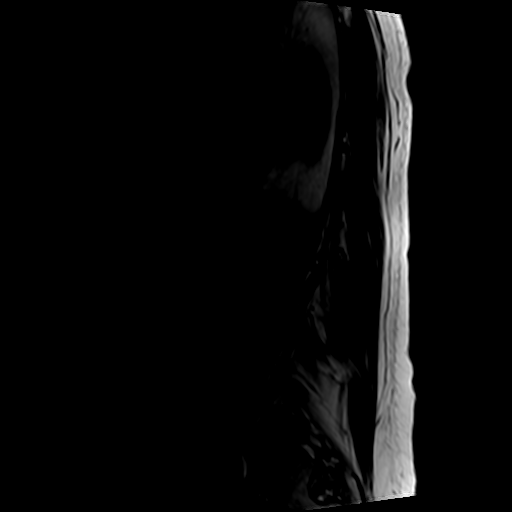

[Series 6: T2 · axial · 4.0mm · 0.70mm/px · z∈[-93,+127]mm · 9 of 40 slices shown (2 of 2)]
[im 1/40]
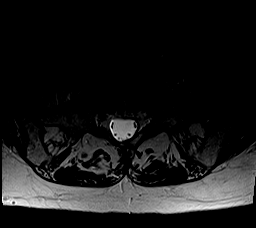
[im 6/40]
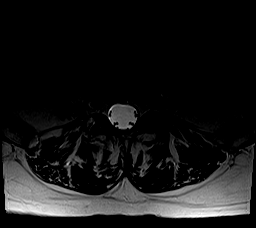
[im 12/40]
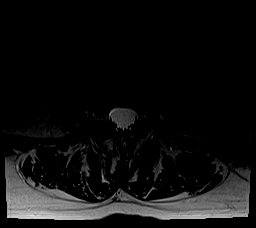
[im 17/40]
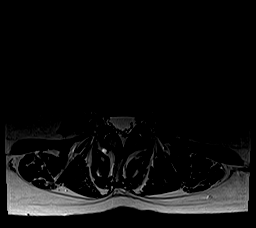
[im 20/40]
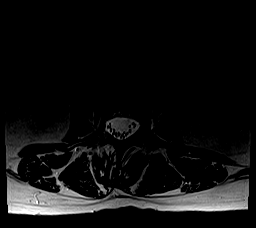
[im 23/40]
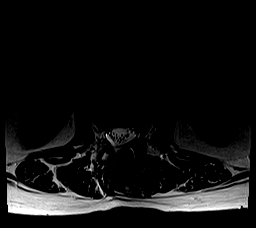
[im 28/40]
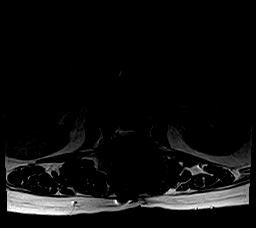
[im 34/40]
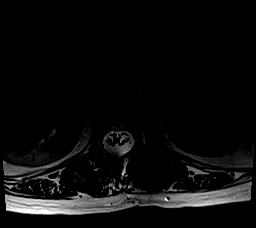
[im 40/40]
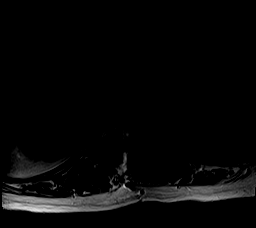

[Series 7: T1 · axial · 4.0mm · 0.35mm/px · z∈[-93,+96]mm · 4 of 40 slices shown (2 of 2)]
[im 1/40]
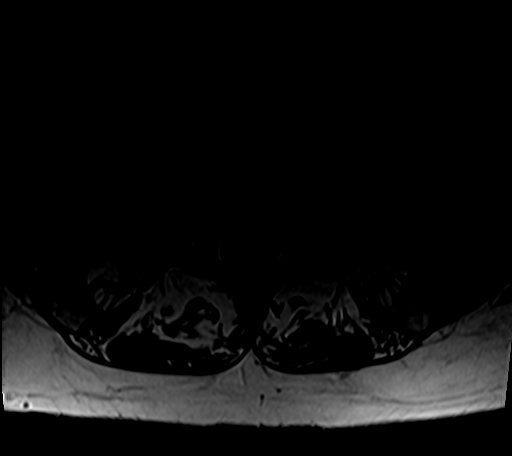
[im 6/40]
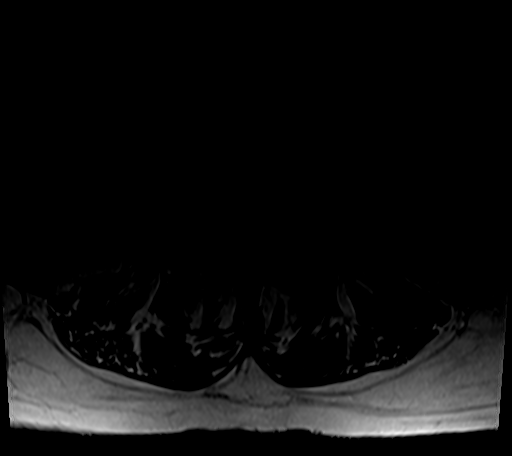
[im 20/40]
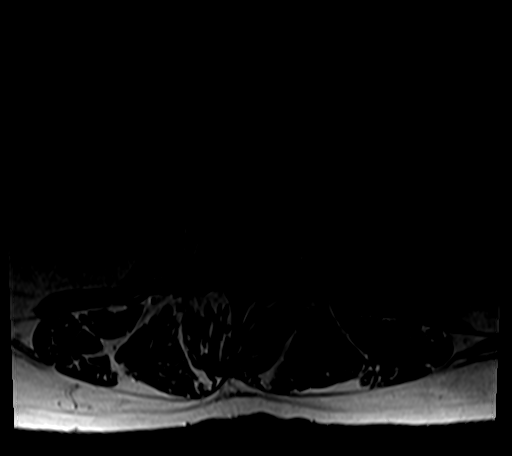
[im 34/40]
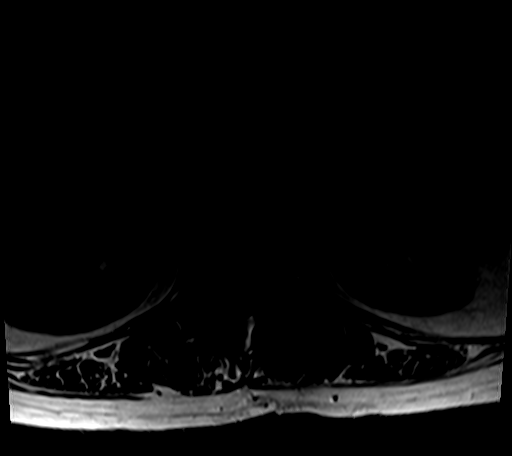

[25 of 48 positions shown; findings below may reference images not displayed]

FINDINGS: Segmentation: Standard; the lowest formed disc space is designated
L5-S1.

Alignment:  Normal.

Vertebrae: There are postsurgical changes reflecting a left-sided
stabilization rod extending from the L2 level inferiorly into the
thoracic spine; the superior aspect of the hardware is not imaged.

There is minimal compression deformity of the T12 vertebral body
with associated STIR signal abnormality consistent with acute to
subacute fracture. There is additional curvilinear T1 hypointensity
in the L1 vertebral body with associated STIR signal abnormality
consistent with additional acute to subacute fracture, though there
is no significant loss of height at L1. There is no bony
retropulsion at either level

Marrow signal at the other levels is normal. The other vertebral
body heights are preserved.

Conus medullaris and cauda equina: Conus extends to the mid L1
level. Conus and cauda equina appear normal.

Paraspinal and other soft tissues: Negative.

Disc levels:

There is mild disc desiccation and narrowing at T12-L1 through
L2-L3.

T12-L1: No significant spinal canal or neural foraminal stenosis.

L1-L2: There is a small central disc protrusion without significant
spinal canal or neural foraminal stenosis.

L2-L3: There is a diffuse disc bulge and mild degenerative endplate
change and facet arthropathy without significant spinal canal or
neural foraminal stenosis. There is no evidence of nerve root
impingement.

L3-L4: There is a mild disc bulge and minimal degenerative endplate
change and facet arthropathy without significant spinal canal or
neural foraminal stenosis.

L4-L5: No significant spinal canal or neural foraminal stenosis.

L5-S1: No significant spinal canal or neural foraminal stenosis.
IMPRESSION: 1. Acute to subacute fractures of the T12 and L1 vertebral bodies
with minimal loss of height at T12 and no significant loss of height
at L1. There is no bony retropulsion at either level.
2. Mild degenerative changes in the lumbar spine detailed above
without significant spinal canal or neural foraminal stenosis, and
no evidence of nerve root impingement.

These results will be called to the ordering clinician or
representative by the Radiologist Assistant, and communication
documented in the PACS or [REDACTED].

## 2020-12-11 ENCOUNTER — Telehealth (INDEPENDENT_AMBULATORY_CARE_PROVIDER_SITE_OTHER): Payer: BC Managed Care – PPO | Admitting: Family Medicine

## 2020-12-11 ENCOUNTER — Encounter: Payer: Self-pay | Admitting: Family Medicine

## 2020-12-11 VITALS — Ht 69.0 in | Wt 148.0 lb

## 2020-12-11 DIAGNOSIS — S32010A Wedge compression fracture of first lumbar vertebra, initial encounter for closed fracture: Secondary | ICD-10-CM | POA: Diagnosis not present

## 2020-12-11 DIAGNOSIS — E559 Vitamin D deficiency, unspecified: Secondary | ICD-10-CM

## 2020-12-11 DIAGNOSIS — E2839 Other primary ovarian failure: Secondary | ICD-10-CM

## 2020-12-11 DIAGNOSIS — S22080A Wedge compression fracture of T11-T12 vertebra, initial encounter for closed fracture: Secondary | ICD-10-CM | POA: Diagnosis not present

## 2020-12-11 DIAGNOSIS — M5442 Lumbago with sciatica, left side: Secondary | ICD-10-CM

## 2020-12-11 DIAGNOSIS — M5441 Lumbago with sciatica, right side: Secondary | ICD-10-CM

## 2020-12-11 MED ORDER — CYCLOBENZAPRINE HCL 10 MG PO TABS
10.0000 mg | ORAL_TABLET | Freq: Three times a day (TID) | ORAL | 1 refills | Status: DC | PRN
Start: 2020-12-11 — End: 2021-06-22

## 2020-12-11 NOTE — Progress Notes (Signed)
Virtual Visit via Video Note  I connected with Patsi Sears on 12/11/20 at 11:30 AM EDT by a video enabled telemedicine application and verified that I am speaking with the correct person using two identifiers.  Location: Patient: home Provider: office   I discussed the limitations of evaluation and management by telemedicine and the availability of in person appointments. The patient expressed understanding and agreed to proceed.  History of Present Illness:  Ms. Heal if following up after an MRI of her lumbar spine. This was demonstrating a compression fracture at T12 and L1.    Observations/Objective:   Assessment and Plan:  Compression Fracture of T12 and L1:  Acutely occurring compression fracture observed on MRI. -Counseled on home exercise therapy and supportive care. -X-ray. -CMP, CBC, TSH, PTH -Flexeril -MRI of thoracic spine to evaluate for any further compression fractures.  Follow Up Instructions:    I discussed the assessment and treatment plan with the patient. The patient was provided an opportunity to ask questions and all were answered. The patient agreed with the plan and demonstrated an understanding of the instructions.   The patient was advised to call back or seek an in-person evaluation if the symptoms worsen or if the condition fails to improve as anticipated.    Clare Gandy, MD

## 2020-12-11 NOTE — Assessment & Plan Note (Addendum)
Acutely occurring compression fracture observed on MRI. -Counseled on home exercise therapy and supportive care. -X-ray. -CMP, CBC, TSH, PTH -Flexeril -MRI of thoracic spine to evaluate for any further compression fractures.

## 2020-12-11 NOTE — Assessment & Plan Note (Signed)
Found on most recent MRI.

## 2020-12-11 NOTE — Assessment & Plan Note (Signed)
Recent compression fractures with concern for osteoporosis. -Bone density.

## 2020-12-11 NOTE — Assessment & Plan Note (Signed)
Check Vit D 

## 2020-12-12 LAB — PTH, INTACT AND CALCIUM: PTH: 39 pg/mL (ref 15–65)

## 2020-12-12 LAB — COMPREHENSIVE METABOLIC PANEL
ALT: 15 IU/L (ref 0–32)
AST: 16 IU/L (ref 0–40)
Albumin/Globulin Ratio: 2 (ref 1.2–2.2)
Albumin: 4.7 g/dL (ref 3.8–4.9)
Alkaline Phosphatase: 128 IU/L — ABNORMAL HIGH (ref 44–121)
BUN/Creatinine Ratio: 31 — ABNORMAL HIGH (ref 9–23)
BUN: 20 mg/dL (ref 6–24)
Bilirubin Total: 0.3 mg/dL (ref 0.0–1.2)
CO2: 23 mmol/L (ref 20–29)
Calcium: 9.4 mg/dL (ref 8.7–10.2)
Chloride: 99 mmol/L (ref 96–106)
Creatinine, Ser: 0.64 mg/dL (ref 0.57–1.00)
Globulin, Total: 2.4 g/dL (ref 1.5–4.5)
Glucose: 94 mg/dL (ref 70–99)
Potassium: 4.2 mmol/L (ref 3.5–5.2)
Sodium: 139 mmol/L (ref 134–144)
Total Protein: 7.1 g/dL (ref 6.0–8.5)
eGFR: 106 mL/min/{1.73_m2} (ref >59)

## 2020-12-12 LAB — CBC
Hematocrit: 37.8 % (ref 34.0–46.6)
Hemoglobin: 13 g/dL (ref 11.1–15.9)
MCH: 31.8 pg (ref 26.6–33.0)
MCHC: 34.4 g/dL (ref 31.5–35.7)
MCV: 92 fL (ref 79–97)
Platelets: 270 10*3/uL (ref 150–450)
RBC: 4.09 x10E6/uL (ref 3.77–5.28)
RDW: 13 % (ref 11.7–15.4)
WBC: 7.4 10*3/uL (ref 3.4–10.8)

## 2020-12-12 LAB — TSH: TSH: 1.29 u[IU]/mL (ref 0.450–4.500)

## 2020-12-13 ENCOUNTER — Other Ambulatory Visit (HOSPITAL_BASED_OUTPATIENT_CLINIC_OR_DEPARTMENT_OTHER): Payer: Self-pay | Admitting: Family Medicine

## 2020-12-13 ENCOUNTER — Telehealth: Payer: Self-pay | Admitting: Family Medicine

## 2020-12-13 DIAGNOSIS — S22080A Wedge compression fracture of T11-T12 vertebra, initial encounter for closed fracture: Secondary | ICD-10-CM

## 2020-12-13 NOTE — Telephone Encounter (Signed)
Informed of results.   Myra Rude, MD [facility] 12/13/2020, 8:08 AM

## 2020-12-14 ENCOUNTER — Ambulatory Visit (HOSPITAL_BASED_OUTPATIENT_CLINIC_OR_DEPARTMENT_OTHER)
Admission: RE | Admit: 2020-12-14 | Discharge: 2020-12-14 | Disposition: A | Discharge status: Home / Self Care | Payer: BC Managed Care – PPO | Source: Ambulatory Visit | Attending: Family Medicine | Admitting: Family Medicine

## 2020-12-14 ENCOUNTER — Other Ambulatory Visit: Payer: Self-pay

## 2020-12-14 DIAGNOSIS — E2839 Other primary ovarian failure: Secondary | ICD-10-CM | POA: Insufficient documentation

## 2020-12-14 DIAGNOSIS — S22080A Wedge compression fracture of T11-T12 vertebra, initial encounter for closed fracture: Secondary | ICD-10-CM

## 2020-12-14 DIAGNOSIS — M810 Age-related osteoporosis without current pathological fracture: Secondary | ICD-10-CM | POA: Diagnosis not present

## 2020-12-14 DIAGNOSIS — M546 Pain in thoracic spine: Secondary | ICD-10-CM | POA: Diagnosis not present

## 2020-12-14 DIAGNOSIS — S32010A Wedge compression fracture of first lumbar vertebra, initial encounter for closed fracture: Secondary | ICD-10-CM | POA: Insufficient documentation

## 2020-12-14 DIAGNOSIS — M85832 Other specified disorders of bone density and structure, left forearm: Secondary | ICD-10-CM | POA: Diagnosis not present

## 2020-12-14 IMAGING — CR DG THORACIC SPINE 2V
3 series · 3 of 3 positions shown · non-contrast
Comparison: Thoracic spine radiograph dated [DATE].

CLINICAL DATA: Back pain.  Compression fracture.

EXAM:
THORACIC SPINE 2 VIEWS

[w t-spine a.p. *]
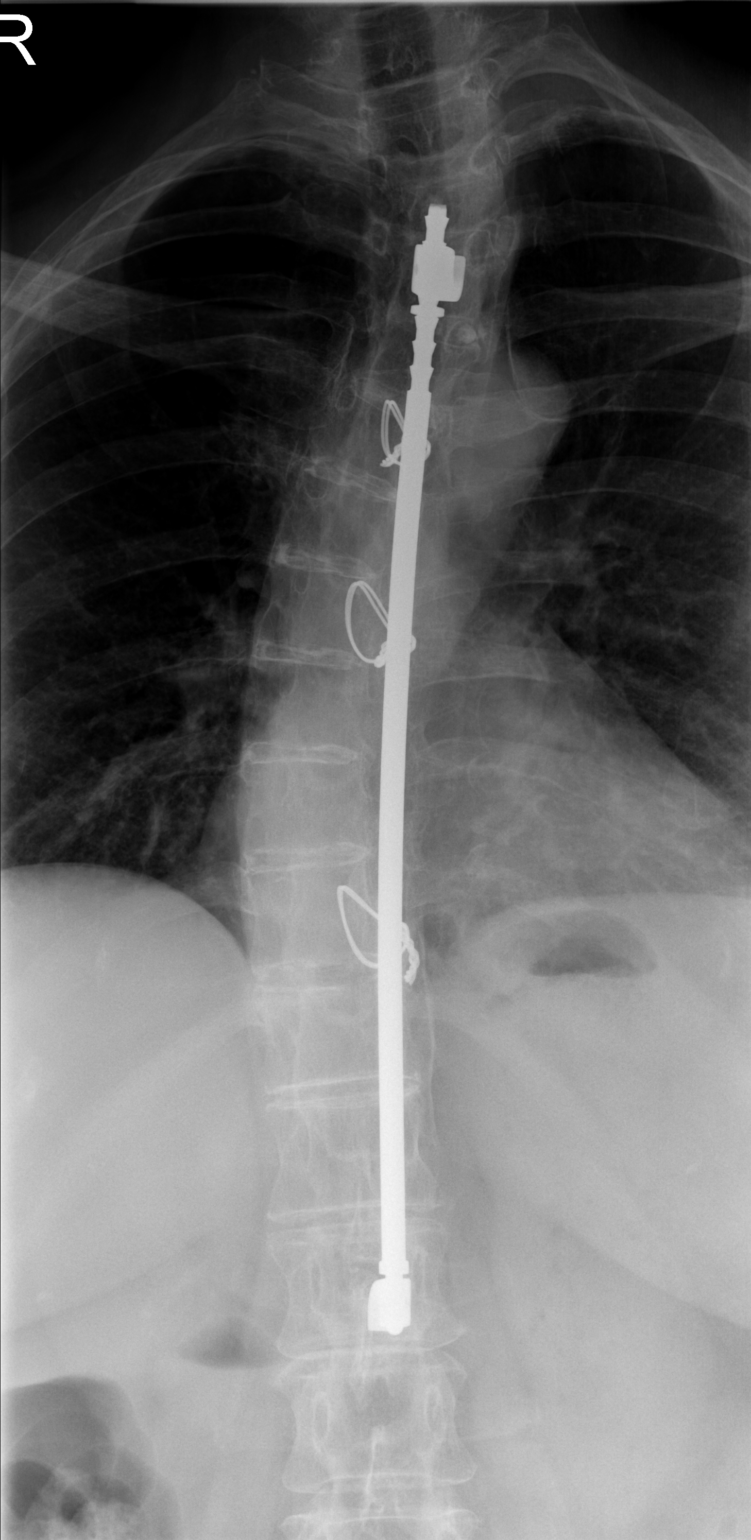

[w t-spine lat *]
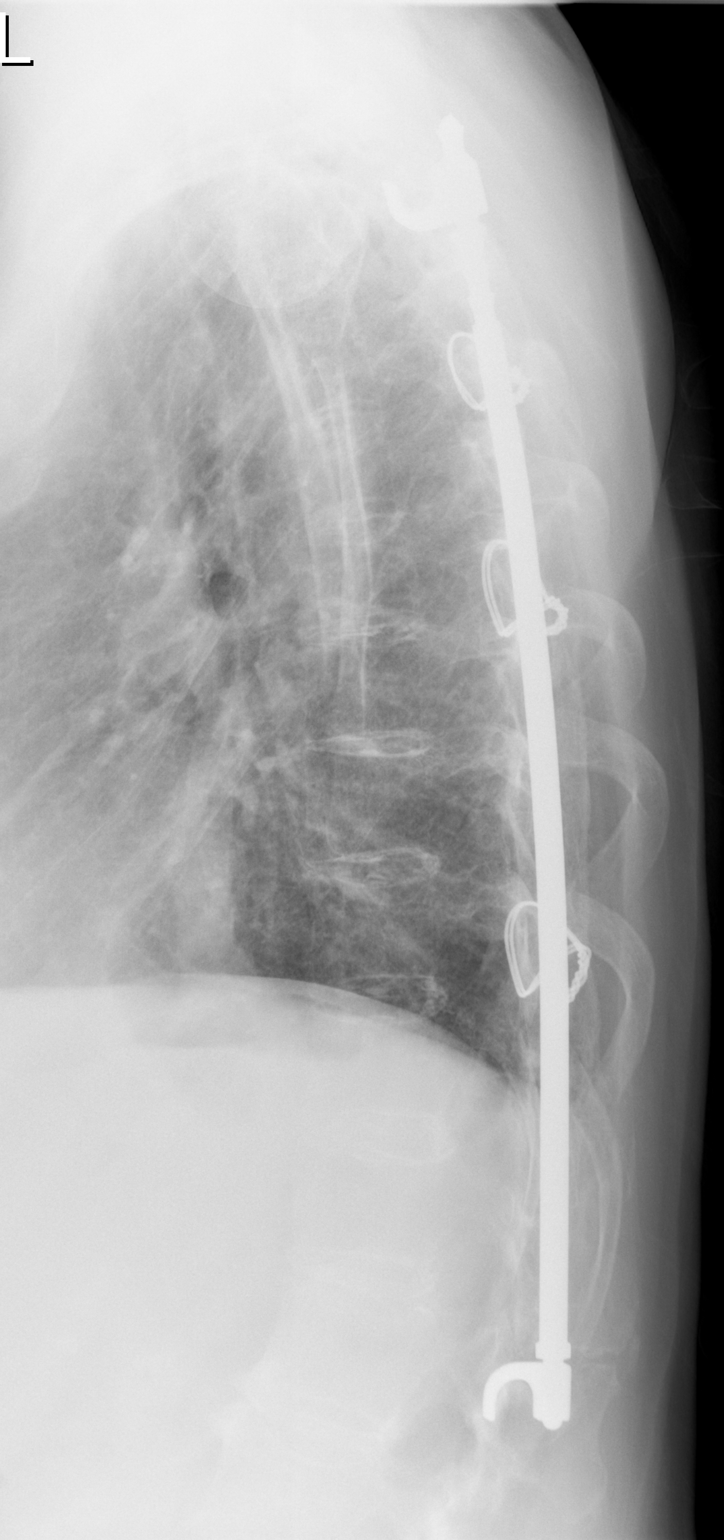

[w swimmers view]
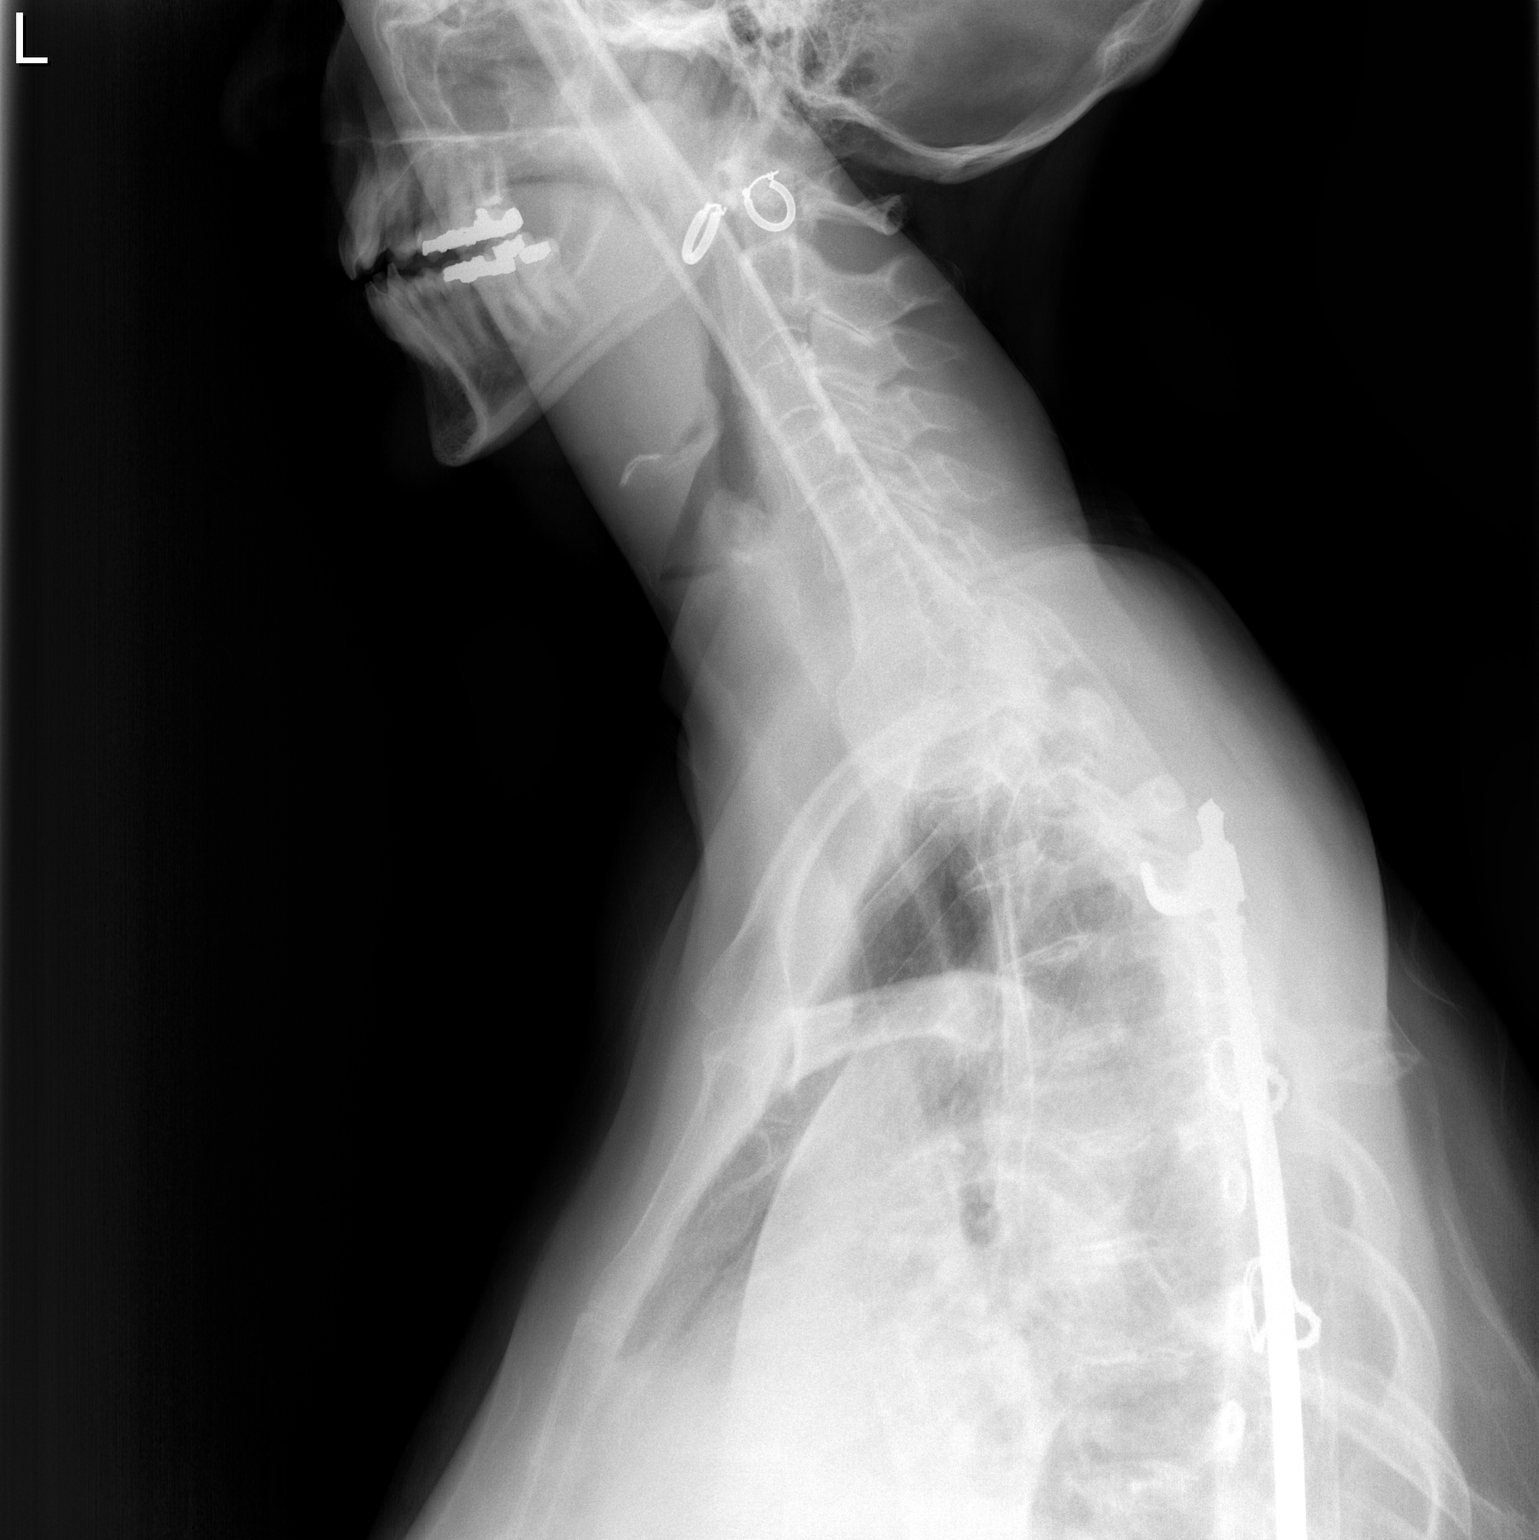

[3 of 3 positions shown; findings below may reference images not displayed]

FINDINGS: No acute fracture or subluxation. There is thoracic dextroscoliosis.
A Harrington fixation rod noted. The soft tissues are unremarkable.
IMPRESSION: No acute fracture or subluxation.

## 2020-12-15 ENCOUNTER — Telehealth: Payer: Self-pay | Admitting: Family Medicine

## 2020-12-15 NOTE — Telephone Encounter (Signed)
Informed of results.   Myra Rude, MD [facility] 12/15/2020, 8:24 AM

## 2020-12-19 ENCOUNTER — Telehealth: Payer: Self-pay | Admitting: Family Medicine

## 2020-12-19 DIAGNOSIS — S22080A Wedge compression fracture of T11-T12 vertebra, initial encounter for closed fracture: Secondary | ICD-10-CM

## 2020-12-19 NOTE — Telephone Encounter (Signed)
Informed of thoracic spine x-ray.  We will pursue CT of the thoracic spine to evaluate for compression fracture given she has a harrington fixation rod and a recent MRI of the lumbar spine indicating an acute compression fracture at T12.  Myra Rude, MD [facility] 12/19/2020, 9:46 AM

## 2020-12-22 ENCOUNTER — Ambulatory Visit (HOSPITAL_BASED_OUTPATIENT_CLINIC_OR_DEPARTMENT_OTHER)
Admission: RE | Admit: 2020-12-22 | Discharge: 2020-12-22 | Disposition: A | Discharge status: Home / Self Care | Payer: BC Managed Care – PPO | Source: Ambulatory Visit | Attending: Family Medicine | Admitting: Family Medicine

## 2020-12-22 ENCOUNTER — Other Ambulatory Visit: Payer: Self-pay

## 2020-12-22 DIAGNOSIS — S22080A Wedge compression fracture of T11-T12 vertebra, initial encounter for closed fracture: Secondary | ICD-10-CM | POA: Insufficient documentation

## 2020-12-22 DIAGNOSIS — S22080D Wedge compression fracture of T11-T12 vertebra, subsequent encounter for fracture with routine healing: Secondary | ICD-10-CM | POA: Diagnosis not present

## 2020-12-22 IMAGING — CT CT T SPINE W/O CM
3 of 4 series · 10 of 33 positions shown, 11 images · non-contrast
Comparison: MRI lumbar spine [DATE]

CLINICAL DATA: Compression fractures

EXAM:
CT THORACIC SPINE WITHOUT CONTRAST
TECHNIQUE: Multidetector CT images of the thoracic were obtained using the
standard protocol without intravenous contrast.

[Series 4: sagittal bone · sagittal · 0.32mm/px · 5 of 62 slices shown]
[im 21/62  bone]
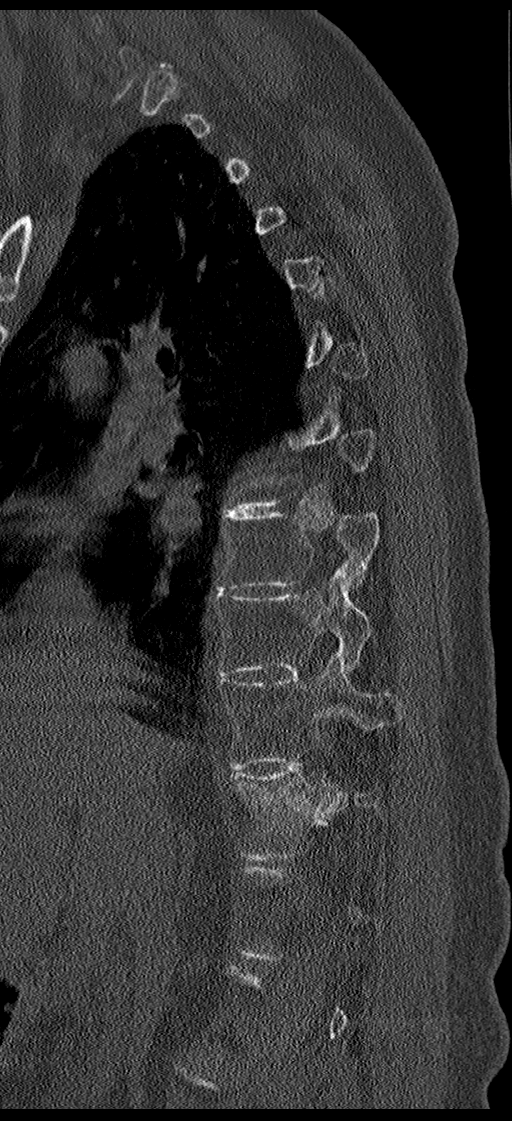
[im 26/62  bone]
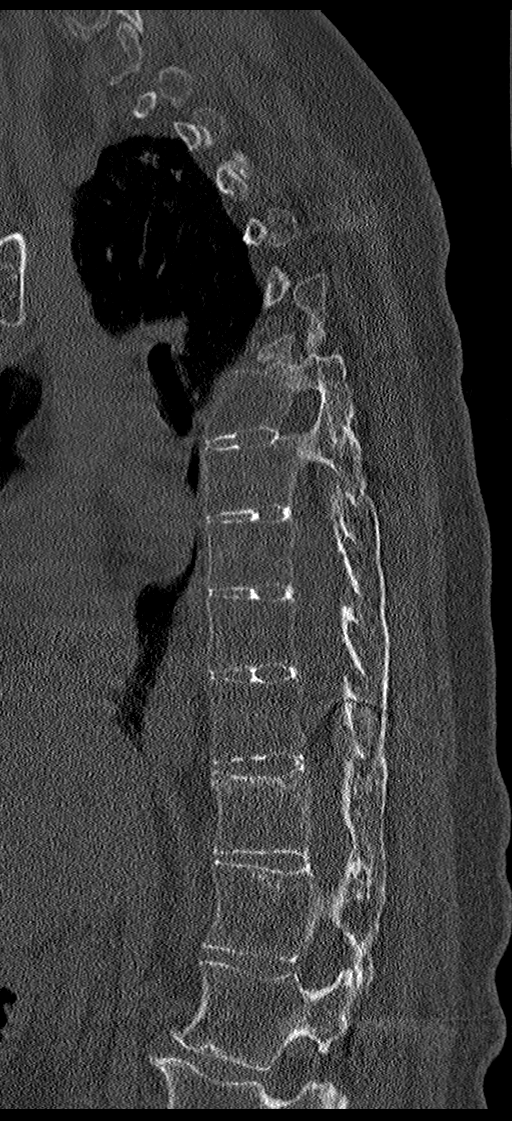
[im 31/62  bone]
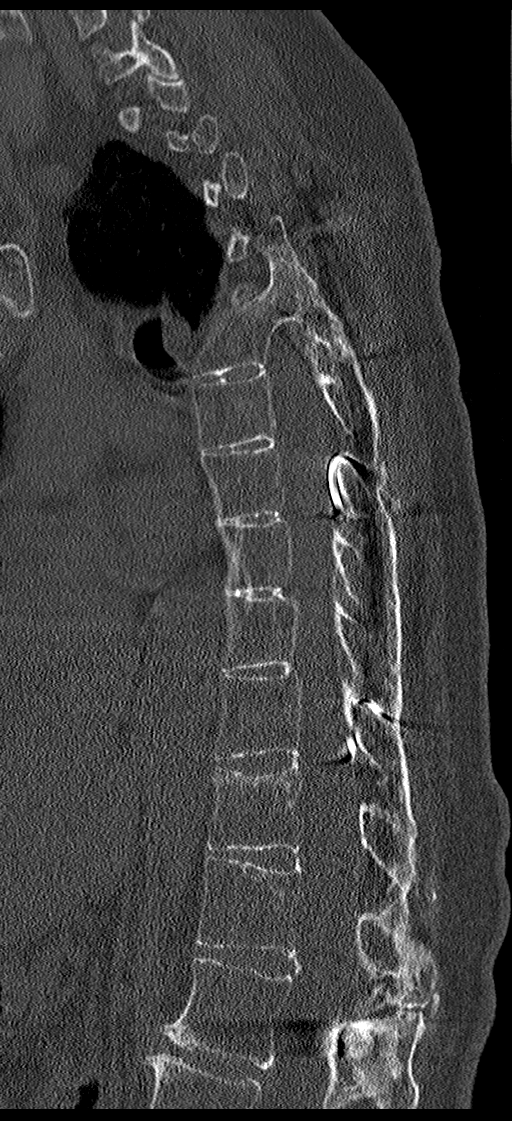
[im 36/62  bone]
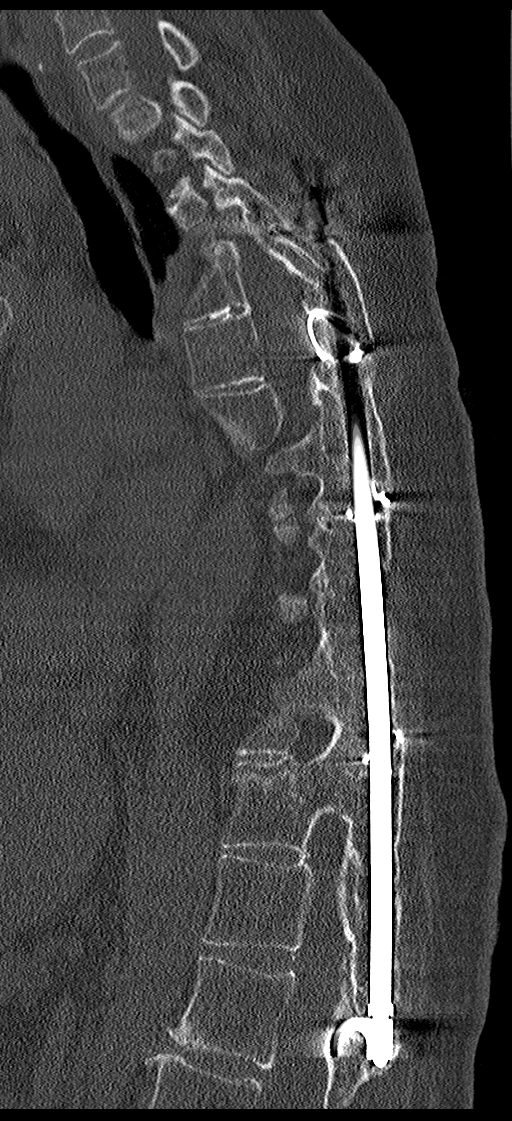
[im 41/62  bone]
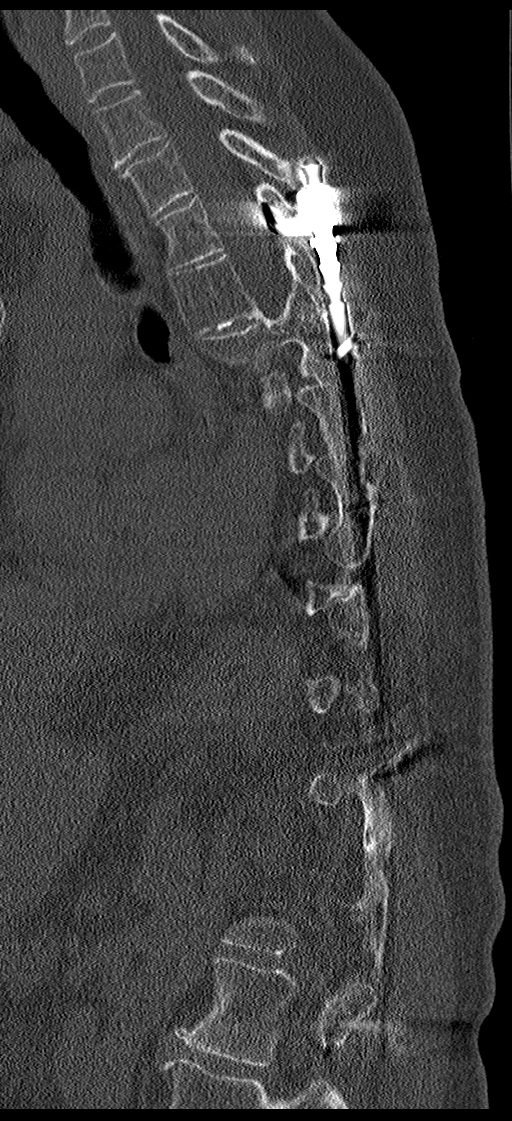

[Series 5: coronal bone · coronal · 0.31mm/px · 3 of 65 slices shown]
[im 13/65  bone]
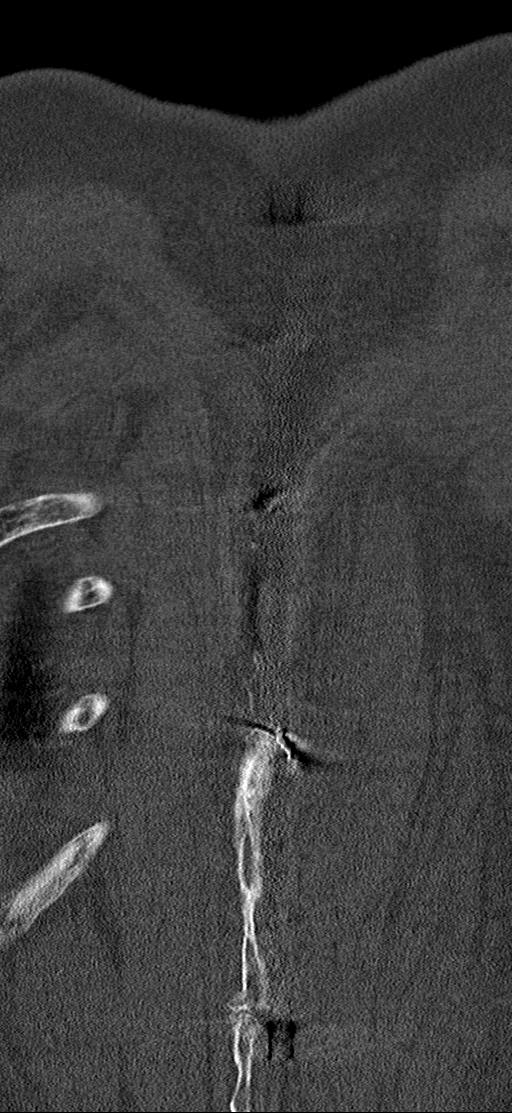
[im 26/65  bone]
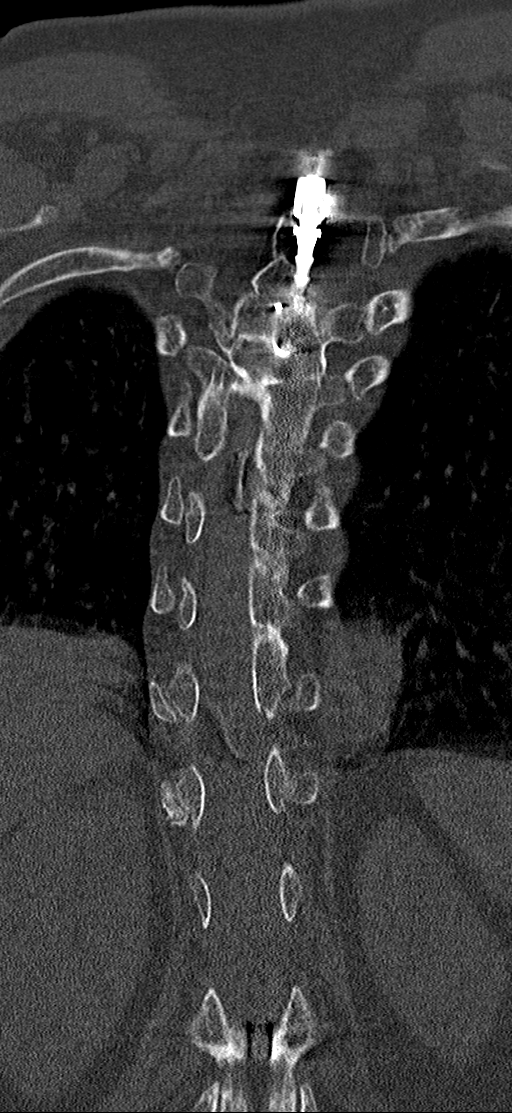
[im 39/65  bone]
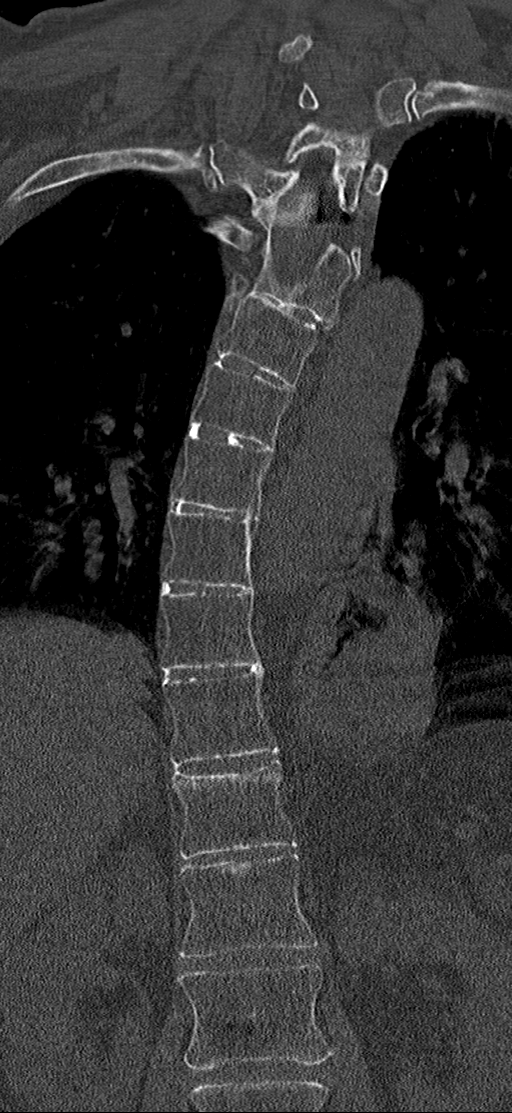

[Series 7: orthogonal axials · axial · 0.27mm/px · z∈[-711,-595]mm · 2 of 174 slices shown, 3 images]
[im 58/174  soft-tissue]
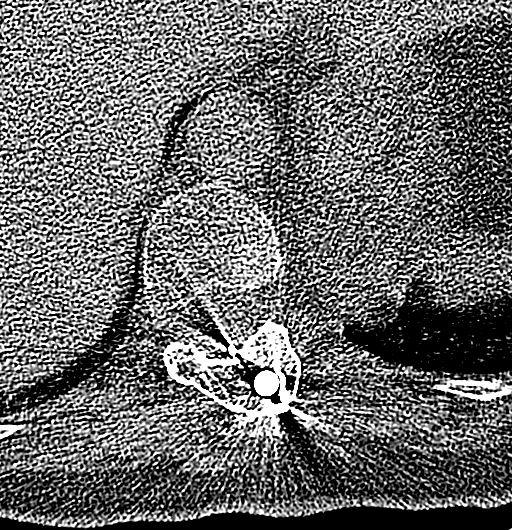
[im 58/174  bone]
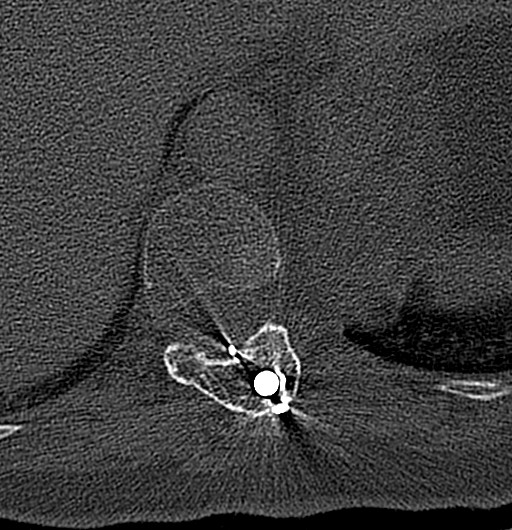
[im 116/174  bone]
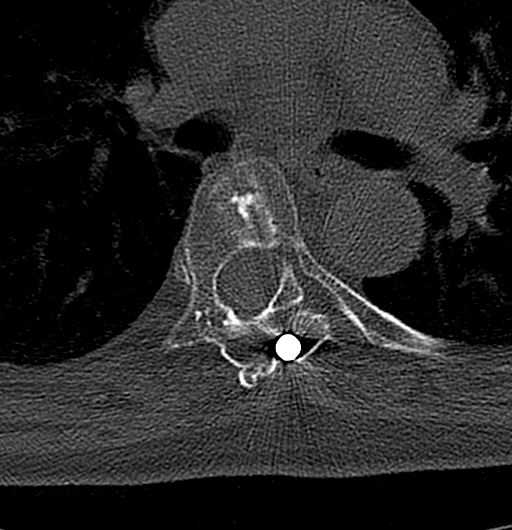

[10 of 33 positions shown; findings below may reference images not displayed]

FINDINGS: Alignment: S shaped scoliosis. Posterior fusion rod extending from
the upper thoracic to upper lumbar spine. No subluxation visualized.

Vertebrae: Subtle recent superior endplate compression fractures of
T12 and L1 are again seen with minimal height loss. No bony
retropulsion. No additional fracture visualized.

Paraspinal and other soft tissues: No significant abnormality.

Disc levels: No significant abnormality.
IMPRESSION: Redemonstration of superior endplate compression fractures of T12
and L1 with minimal height loss, not significantly changed since
previous MRI given differences in technique.

## 2020-12-25 ENCOUNTER — Telehealth (INDEPENDENT_AMBULATORY_CARE_PROVIDER_SITE_OTHER): Payer: BC Managed Care – PPO | Admitting: Family Medicine

## 2020-12-25 DIAGNOSIS — S32010D Wedge compression fracture of first lumbar vertebra, subsequent encounter for fracture with routine healing: Secondary | ICD-10-CM

## 2020-12-25 DIAGNOSIS — S22080D Wedge compression fracture of T11-T12 vertebra, subsequent encounter for fracture with routine healing: Secondary | ICD-10-CM

## 2020-12-25 DIAGNOSIS — G5702 Lesion of sciatic nerve, left lower limb: Secondary | ICD-10-CM | POA: Diagnosis not present

## 2020-12-25 DIAGNOSIS — E559 Vitamin D deficiency, unspecified: Secondary | ICD-10-CM

## 2020-12-25 NOTE — Assessment & Plan Note (Signed)
Her symptoms down her left leg are still occurring. MRI of the lumbar spine was not demonstrating a source.  - counseled on home exercise therapy and supportive care - referral to physical therapy

## 2020-12-25 NOTE — Assessment & Plan Note (Signed)
Check vitamin D with osteoporosis

## 2020-12-25 NOTE — Assessment & Plan Note (Signed)
Referral to physical therapy. 

## 2020-12-25 NOTE — Progress Notes (Signed)
Virtual Visit via Video Note  I connected with Roberta Sims on 12/25/20 at 11:30 AM EDT by a video enabled telemedicine application and verified that I am speaking with the correct person using two identifiers.  Location: Patient: office Provider: home   I discussed the limitations of evaluation and management by telemedicine and the availability of in person appointments. The patient expressed understanding and agreed to proceed.  History of Present Illness:  Roberta Sims is a 50 yo F that is following up after her CT thoracic spine. Not showing any new fractures other than the ones previously described on MRi.    Observations/Objective:   Assessment and Plan:  Piriformis Syndrome:  Her symptoms down her left leg are still occurring. MRI of the lumbar spine was not demonstrating a source.  - counseled on home exercise therapy and supportive care - referral to physical therapy   Vit D def:  Check vitamin D with osteoporosis   L1 and T12 compression fracture:  Referral to physical therapy  Pursue eventiy   Follow Up Instructions:    I discussed the assessment and treatment plan with the patient. The patient was provided an opportunity to ask questions and all were answered. The patient agreed with the plan and demonstrated an understanding of the instructions.   The patient was advised to call back or seek an in-person evaluation if the symptoms worsen or if the condition fails to improve as anticipated.   Clare Gandy, MD

## 2020-12-25 NOTE — Assessment & Plan Note (Signed)
Referral to physical therapy  Pursue eventiy

## 2020-12-27 ENCOUNTER — Telehealth: Payer: Self-pay

## 2020-12-27 NOTE — Telephone Encounter (Signed)
Needs VOB for [place]  

## 2021-01-08 NOTE — Telephone Encounter (Signed)
Prior Auth initiated via [email or web address] KEY: [id number]

## 2021-01-16 ENCOUNTER — Encounter: Payer: Self-pay | Admitting: Physical Therapy

## 2021-01-16 ENCOUNTER — Ambulatory Visit: Payer: BC Managed Care – PPO | Attending: Family Medicine | Admitting: Physical Therapy

## 2021-01-16 ENCOUNTER — Other Ambulatory Visit: Payer: Self-pay

## 2021-01-16 DIAGNOSIS — S32010D Wedge compression fracture of first lumbar vertebra, subsequent encounter for fracture with routine healing: Secondary | ICD-10-CM | POA: Diagnosis not present

## 2021-01-16 DIAGNOSIS — R262 Difficulty in walking, not elsewhere classified: Secondary | ICD-10-CM | POA: Insufficient documentation

## 2021-01-16 DIAGNOSIS — G5702 Lesion of sciatic nerve, left lower limb: Secondary | ICD-10-CM | POA: Diagnosis not present

## 2021-01-16 DIAGNOSIS — M79605 Pain in left leg: Secondary | ICD-10-CM | POA: Insufficient documentation

## 2021-01-16 DIAGNOSIS — S22080D Wedge compression fracture of T11-T12 vertebra, subsequent encounter for fracture with routine healing: Secondary | ICD-10-CM | POA: Diagnosis not present

## 2021-01-16 DIAGNOSIS — M6281 Muscle weakness (generalized): Secondary | ICD-10-CM | POA: Insufficient documentation

## 2021-01-16 NOTE — Therapy (Signed)
[facility] [place] [address] [place], [place], [address] Phone: [phone]   Fax:  [phone]  Physical Therapy Evaluation  Patient Details  Name: Roberta Sims MRN: [id number] Date of Birth: July 27, 1970 Referring Provider (PT): Clare Gandy   Encounter Date: 01/16/2021   PT End of Session - 01/16/21 1047     Visit Number 1    Number of Visits 21    Date for PT Re-Evaluation 03/27/21    PT Start Time 0846    PT Stop Time 0933    PT Time Calculation (min) 47 min    Activity Tolerance Patient tolerated treatment well;Patient limited by pain    Behavior During Therapy [facility] for tasks assessed/performed             Past Medical History:  Diagnosis Date   GERD with stricture 03/04/2019   Hx of sessile serrated colonic polyp 06/28/2013   Scoliosis     Past Surgical History:  Procedure Laterality Date   COLONOSCOPY     ESOPHAGOGASTRODUODENOSCOPY     harrington rod--scoliosis      There were no vitals filed for this visit.    Subjective Assessment - 01/16/21 0858     Subjective Patient has Harrington Rod on her L, from previous scoliosis surgery. She has developed L sided pain and reports that the whole L side does not feel like her own. She has severe L hip and buttocks pain, L lateral knee pain. She discovered compression fractures at T12, L1 after trying to move a large, heavy mirror up some steps. Osteopenia and osteoporosis discovered as well.    Pertinent History scoliosis with Harrington Rod implanted 35 years ago.    Limitations Lifting;Sitting;Standing;Walking;House hold activities    How long can you sit comfortably? Constantly uncomfortable.    How long can you stand comfortably? Constantly uncomfortable.    How long can you walk comfortably? Constantly uncomfortable.    Diagnostic tests MRI, CT demosntrate compression fractures, test for bone density revealed osteopenia/osteoporosis    Patient Stated Goals  Build strength, improve flexibility, decrease pain.    Currently in Pain? Yes    Pain Score 6     Pain Location Other (Comment)   entire L side   Pain Orientation Left;Right;Distal;Proximal;Medial;Lateral;Posterior;Anterior    Pain Descriptors / Indicators Numbness;Aching;Burning;Shooting;Sharp;Radiating    Pain Type Chronic pain;Neuropathic pain    Pain Radiating Towards recently started having spasms in B calves and feet each night which wake her up and are only relieved with standing stretch.,    Pain Onset More than a month ago    Pain Frequency Constant    Aggravating Factors  too much activity    Pain Relieving Factors has had steroid injections    Effect of Pain on Daily Activities Limits all activity.                [facility] PT Assessment - 01/16/21 0001       Assessment   Referring Provider (PT) Clare Gandy      Restrictions   Weight Bearing Restrictions No      Balance Screen   Has the patient fallen in the past 6 months No      Home Environment   Living Environment Private residence    Living Arrangements Spouse/significant other    Available Help at Discharge Family    Type of Home House    Home Access Stairs to enter    Entrance Stairs-Number of Steps 2  Entrance Stairs-Rails None    Home Layout Two level;Able to live on main level with bedroom/bathroom      Prior Function   Level of Independence Independent    Vocation Part time employment    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    Leisure walking, read, write, golf      Cognition   Overall Cognitive Status Within Functional Limits for tasks assessed      Posture/Postural Control   Posture Comments round shoulders, R scap with mild wing-possibly due to scoliosis.      ROM / Strength   AROM / PROM / Strength AROM;Strength      AROM   Overall AROM Comments Generally WFL for BU and LE, except SLR, 0-59 L, 0-64 R      Strength   Overall Strength Comments  BUE WNL throughout, RLE4+/5 throughout, LLE (4-)-4/5 throughout.                        Objective measurements completed on examination: See above findings.                PT Education - 01/16/21 0935     Education Details HEP, POC    Person(s) Educated Patient    Methods Explanation;Demonstration;Handout    Comprehension Verbalized understanding;Returned demonstration              PT Short Term Goals - 01/16/21 1106       PT SHORT TERM GOAL #1   Title I with basic HEP    Baseline Initiated    Time 4    Period Weeks    Status New    Target Date 02/13/21               PT Long Term Goals - 01/16/21 1107       PT LONG TERM GOAL #1   Title I with final HEP    Baseline Initiated.    Time 10    Period Weeks    Status New    Target Date 03/27/21      PT LONG TERM GOAL #2   Title Decrease report of pain to < 3/10 during standing, walking activities.    Baseline up to 8/10    Time 10    Period Weeks    Status New    Target Date 03/27/21      PT LONG TERM GOAL #3   Title Increase LLE strength to 4+/5 throughout    Baseline (4-)-4/5    Time 10    Period Weeks    Status New    Target Date 03/27/21      PT LONG TERM GOAL #4   Title Patient will demonstrate safe lifting of 20# using proper body mechanics, to allow her to perform expected job requirements without re-injury.    Baseline Compression fractures after carrying heavy mirror.    Time 10    Period Weeks    Status New    Target Date 03/27/21                    Plan - 01/16/21 1048     Clinical Impression Statement Patient is a 50 YO female who presents with H/O scoliosis-Harrington Rods placed 35 years ago. She reports increasing pain, increasing weakness, especially on L side. Tests reveal compression fractures at T12, L1, probably sustained when helping someone move a heavy mirror up steps about 7 weeks ago.  She also has osteopenia and osteoporosis. She  reports Severe L lateral knee pain, with a sore ITB, L hip pain with mildly tightened, and weak piriformis. She will benefit from PT for overall strengthening, emphasizing postural control, core, and LE strengthening. She would also benefit from further SI screening to determine if this is contributing to pain.    Personal Factors and Comorbidities Fitness;Comorbidity 1    Comorbidities Scoliosis with Harrington rod placement.    Examination-Activity Limitations Locomotion Level;Stand;Stairs;Lift;Squat;Sleep;Sit;Carry;Reach Overhead;Bend    Examination-Participation Restrictions Meal Prep;Occupation;Cleaning;Driving;Yard Work    Stability/Clinical Decision Making Stable/Uncomplicated    Clinical Decision Making Low    Rehab Potential Good    PT Frequency Other (comment)   Initially 2 x/week, decrease as tolerated to 1x/week.   PT Duration Other (comment)   10w   PT Treatment/Interventions ADLs/Self Care Home Management;Electrical Stimulation;Moist Heat;Iontophoresis 4mg /ml Dexamethasone;Gait training;Stair training;Taping;Vasopneumatic Device;Patient/family education;Functional mobility training;Therapeutic activities;Therapeutic exercise;Balance training;Neuromuscular re-education;Manual techniques;Passive range of motion;Dry needling    PT Next Visit Plan Progress HEP, assess SI function.    PT Home Exercise Plan [email or web address]    Consulted and Agree with Plan of Care Patient             Patient will benefit from skilled therapeutic intervention in order to improve the following deficits and impairments:  Decreased coordination, Decreased range of motion, Difficulty walking, Impaired tone, Decreased endurance, Increased muscle spasms, Decreased activity tolerance, Pain, Decreased balance, Impaired flexibility, Improper body mechanics, Postural dysfunction, Decreased strength, Decreased mobility  Visit Diagnosis: Muscle weakness (generalized)  Pain in left leg  Difficulty in walking, not  elsewhere classified     Problem List Patient Active Problem List   Diagnosis Date Noted   Compression fracture of T12 vertebra (HCC) 12/11/2020   Compression fracture of L1 lumbar vertebra (HCC) 12/11/2020   Estrogen deficiency 12/11/2020   Vitamin D deficiency 12/11/2020   History of thoracic spinal fusion 11/23/2020   Piriformis syndrome of left side 11/14/2020   GERD with stricture 03/04/2019   Esophageal dysphagia 01/07/2019   Essential familial hyperlipidemia 04/24/2018   Hypertriglyceridemia, familial 04/24/2018   Anxiety 04/23/2018   H/O scoliosis 04/23/2018   Impaired fasting glucose 04/23/2018   Exposure to hepatitis C 03/25/2018   Menopausal symptoms 03/25/2018   Pain of left side of body 03/25/2018   Hx of colonic polyps 06/28/2013    08/28/2013, DPT 01/16/2021, 11:25 AM  [facility] [place] [facility] [address] [place], [place], [place] Phone: [phone]   Fax:  [phone]  Name: Roberta Sims MRN: Patsi Sears Date of Birth: March 14, 1970

## 2021-01-16 NOTE — Patient Instructions (Signed)
Access Code: [email or web address] URL: [email or web address] Date: 01/16/2021 Prepared by: Oley Balm  Exercises Supine Figure 4 Piriformis Stretch - 1 x daily - 7 x weekly - 3 reps - 20 hold Supine Bridge with Resistance Band - 1 x daily - 7 x weekly - 2 sets - 10 reps Supine Hamstring Stretch - 1 x daily - 7 x weekly - 3 sets - 20 hold Seated Hamstring Stretch - 1 x daily - 7 x weekly - 3 sets - 20 hold Supine Bridge with Mini Swiss Ball Between Knees - 1 x daily - 7 x weekly - 2 sets - 10 reps

## 2021-01-18 NOTE — Telephone Encounter (Signed)
Pt ready for scheduling on or after 01/19/21  Out-of-pocket cost due at time of visit: $0.00  Primary: BCBS [place] co-insurance: 0% Admin fee co-insurance: 0%  Secondary: n/a Evenity co-insurance:  Admin fee co-insurance:   Deductible: does not apply  Prior Auth: APPROVED [place]# [email or web address] Key: [id number] Valid: 01/08/21-01/07/22   ** This summary of benefits is an estimation of the patient's out-of-pocket cost. Exact cost may vary based on individual plan coverage.

## 2021-01-24 ENCOUNTER — Ambulatory Visit: Payer: BC Managed Care – PPO | Admitting: Physical Therapy

## 2021-01-26 ENCOUNTER — Ambulatory Visit: Payer: BC Managed Care – PPO | Admitting: Physical Therapy

## 2021-01-29 NOTE — Telephone Encounter (Signed)
Left message for patient to call back to inform $0 OOP cost for Evenity and schedule nurse visit for patient to start Evenity.

## 2021-01-30 ENCOUNTER — Other Ambulatory Visit: Payer: Self-pay

## 2021-01-30 ENCOUNTER — Ambulatory Visit: Payer: BC Managed Care – PPO | Attending: Family Medicine | Admitting: Physical Therapy

## 2021-01-30 ENCOUNTER — Encounter: Payer: Self-pay | Admitting: Physical Therapy

## 2021-01-30 DIAGNOSIS — M6281 Muscle weakness (generalized): Secondary | ICD-10-CM | POA: Diagnosis not present

## 2021-01-30 DIAGNOSIS — R262 Difficulty in walking, not elsewhere classified: Secondary | ICD-10-CM | POA: Insufficient documentation

## 2021-01-30 DIAGNOSIS — M79605 Pain in left leg: Secondary | ICD-10-CM | POA: Diagnosis present

## 2021-01-30 NOTE — Patient Instructions (Signed)
Access Code: [email or web address] URL: [email or web address] Date: 01/30/2021 Prepared by: Oley Balm  Exercises Supine Figure 4 Piriformis Stretch - 1 x daily - 7 x weekly - 3 reps - 20 hold Supine Bridge with Resistance Band - 1 x daily - 7 x weekly - 2 sets - 10 reps Supine Hamstring Stretch - 1 x daily - 7 x weekly - 3 sets - 20 hold Sidelying TFL Stretch - 1 x daily - 7 x weekly - 3 sets - 30 hold Seated Gluteal Stretch - 1 x daily - 7 x weekly - 3 sets - 10 reps Hooklying Clamshell with Resistance - 1 x daily - 7 x weekly - 10 reps

## 2021-01-30 NOTE — Therapy (Signed)
[facility] [place] [facility] [address] [place], [place], [address] Phone: [phone]   Fax:  [phone]  Physical Therapy Treatment  Patient Details  Name: Roberta Sims MRN: [id number] Date of Birth: 1970/10/18 Referring Provider (PT): Clare Gandy   Encounter Date: 01/30/2021   PT End of Session - 01/30/21 1036     Visit Number 2    Number of Visits 21    Date for PT Re-Evaluation 03/27/21    PT Start Time 0852    PT Stop Time 0930    PT Time Calculation (min) 38 min    Activity Tolerance Patient tolerated treatment well;Patient limited by pain    Behavior During Therapy [facility] for tasks assessed/performed             Past Medical History:  Diagnosis Date   GERD with stricture 03/04/2019   Hx of sessile serrated colonic polyp 06/28/2013   Scoliosis     Past Surgical History:  Procedure Laterality Date   COLONOSCOPY     ESOPHAGOGASTRODUODENOSCOPY     harrington rod--scoliosis      There were no vitals filed for this visit.   Subjective Assessment - 01/30/21 0856     Subjective Patient reports inconsistent performance of HEP due to travel, but feels it is appropriately challenging. she feels the STM of ITB did help decrease pain and she is sleeping better, but still has badk and hip pain.    Currently in Pain? Yes    Pain Score 5     Pain Location Hip    Pain Orientation Left;Proximal;Distal;Posterior    Pain Descriptors / Indicators Aching;Constant    Pain Type Chronic pain;Neuropathic pain    Pain Radiating Towards Lateral L knee    Pain Onset More than a month ago    Pain Frequency Constant                [facility] PT Assessment - 01/30/21 0001       Special Tests    Special Tests Sacrolliac Tests    Sacroiliac Tests  --   Patient larely Neg for ant or post torsion of L SI.                          [facility] Adult PT Treatment/Exercise - 01/30/21 0001       Exercises   Exercises  Knee/Hip;Lumbar      Lumbar Exercises: Aerobic   Stationary Bike l3.5 x 5 min      Knee/Hip Exercises: Stretches   Active Hamstring Stretch Both;3 reps;20 seconds    Active Hamstring Stretch Limitations Added ankle pumps for nerve flossing.    ITB Stretch Left;2 reps;30 seconds    ITB Stretch Limitations sidelying on table, leg behind off table.      Knee/Hip Exercises: Supine   Other Supine Knee/Hip Exercises Educated to use tennis ball for deep pressure, trigger point release.      Manual Therapy   Manual Therapy Soft tissue mobilization;Passive ROM;Myofascial release    Soft tissue mobilization L gluts and piriformis    Myofascial Release tennis ball for deep soft tissue release in supine, L hip.    Passive ROM for hip flexion, add after STM                     PT Education - 01/30/21 1035     Education Details Updated HEP, trigger point relief.    Person(s) Educated  Patient    Methods Explanation;Demonstration;Handout    Comprehension Returned demonstration;Verbalized understanding              PT Short Term Goals - 01/30/21 1039       PT SHORT TERM GOAL #1   Title I with basic HEP    Baseline updated today    Time 2    Period Weeks    Status On-going    Target Date 02/13/21               PT Long Term Goals - 01/16/21 1107       PT LONG TERM GOAL #1   Title I with final HEP    Baseline Initiated.    Time 10    Period Weeks    Status New    Target Date 03/27/21      PT LONG TERM GOAL #2   Title Decrease report of pain to < 3/10 during standing, walking activities.    Baseline up to 8/10    Time 10    Period Weeks    Status New    Target Date 03/27/21      PT LONG TERM GOAL #3   Title Increase LLE strength to 4+/5 throughout    Baseline (4-)-4/5    Time 10    Period Weeks    Status New    Target Date 03/27/21      PT LONG TERM GOAL #4   Title Patient will demonstrate safe lifting of 20# using proper body mechanics, to allow  her to perform expected job requirements without re-injury.    Baseline Compression fractures after carrying heavy mirror.    Time 10    Period Weeks    Status New    Target Date 03/27/21                   Plan - 01/30/21 1036     Clinical Impression Statement Patient reports that she was achy after STM, but then felt more relief. Continued to provide STM, MF, trigger point therapy, stretch. Started to add strengthening, but need to add more trunk stabilization. SI screen was (-).    Stability/Clinical Decision Making Stable/Uncomplicated    Clinical Decision Making Low    Rehab Potential Good    PT Frequency 2x / week    PT Duration 8 weeks    PT Next Visit Plan Assess foam rolling, trunk stability to HEP    PT Home Exercise Plan [email or web address]    Consulted and Agree with Plan of Care Patient             Patient will benefit from skilled therapeutic intervention in order to improve the following deficits and impairments:     Visit Diagnosis: Muscle weakness (generalized)  Difficulty in walking, not elsewhere classified  Pain in left leg     Problem List Patient Active Problem List   Diagnosis Date Noted   Compression fracture of T12 vertebra (HCC) 12/11/2020   Compression fracture of L1 lumbar vertebra (HCC) 12/11/2020   Estrogen deficiency 12/11/2020   Vitamin D deficiency 12/11/2020   History of thoracic spinal fusion 11/23/2020   Piriformis syndrome of left side 11/14/2020   GERD with stricture 03/04/2019   Esophageal dysphagia 01/07/2019   Essential familial hyperlipidemia 04/24/2018   Hypertriglyceridemia, familial 04/24/2018   Anxiety 04/23/2018   H/O scoliosis 04/23/2018   Impaired fasting glucose 04/23/2018   Exposure to hepatitis C 03/25/2018   Menopausal symptoms 03/25/2018  Pain of left side of body 03/25/2018   Hx of colonic polyps 06/28/2013    Iona Beard, DPT 01/30/2021, 10:40 AM  [facility] [place]  [facility] [address] [place], [place], [address] Phone: [phone]   Fax:  [phone]  Name: Roberta Sims MRN: [id number] Date of Birth: 1970/12/02

## 2021-02-01 ENCOUNTER — Encounter: Payer: Self-pay | Admitting: Physical Therapy

## 2021-02-01 ENCOUNTER — Other Ambulatory Visit: Payer: Self-pay

## 2021-02-01 ENCOUNTER — Ambulatory Visit: Payer: BC Managed Care – PPO | Admitting: Physical Therapy

## 2021-02-01 DIAGNOSIS — M6281 Muscle weakness (generalized): Secondary | ICD-10-CM

## 2021-02-01 DIAGNOSIS — R262 Difficulty in walking, not elsewhere classified: Secondary | ICD-10-CM

## 2021-02-01 DIAGNOSIS — M79605 Pain in left leg: Secondary | ICD-10-CM

## 2021-02-01 NOTE — Therapy (Signed)
[facility] [place] [facility] [address] [place], [place], [address] Phone: [phone]   Fax:  [phone]  Physical Therapy Treatment  Patient Details  Name: Roberta Sims MRN: [id number] Date of Birth: 04-Feb-1971 Referring Provider (PT): Clare Gandy   Encounter Date: 02/01/2021   PT End of Session - 02/01/21 1102     Visit Number 3    Number of Visits 21    Date for PT Re-Evaluation 03/27/21    PT Start Time 0852    PT Stop Time 0927    PT Time Calculation (min) 35 min    Activity Tolerance Patient tolerated treatment well;Patient limited by pain    Behavior During Therapy [facility] for tasks assessed/performed             Past Medical History:  Diagnosis Date   GERD with stricture 03/04/2019   Hx of sessile serrated colonic polyp 06/28/2013   Scoliosis     Past Surgical History:  Procedure Laterality Date   COLONOSCOPY     ESOPHAGOGASTRODUODENOSCOPY     harrington rod--scoliosis      There were no vitals filed for this visit.   Subjective Assessment - 02/01/21 0925     Pertinent History scoliosis with Harrington Rod implanted 35 years ago.    Currently in Pain? No/denies                               [facility] Adult PT Treatment/Exercise - 02/01/21 0001       Lumbar Exercises: Stretches   Piriformis Stretch Right;Left;3 reps;20 seconds    Figure 4 Stretch 3 reps;20 seconds      Lumbar Exercises: Aerobic   Nustep L5 x 4 min      Lumbar Exercises: Prone   Other Prone Lumbar Exercises Scap retraction, 3 way, horiz add, scption, flexion. 5 reps each, B, over physioball.      Knee/Hip Exercises: Standing   Hip Flexion Stengthening;Both;1 set;5 reps    Hip Abduction Stengthening;Both;1 set;5 reps    Hip Extension Stengthening;Both;1 set;5 reps                     PT Education - 02/01/21 1102     Education Details Foam rolling, Updated HEP for core strength and mobility.    Person(s)  Educated Patient    Methods Explanation;Demonstration;Handout    Comprehension Returned demonstration;Verbalized understanding              PT Short Term Goals - 01/30/21 1039       PT SHORT TERM GOAL #1   Title I with basic HEP    Baseline updated today    Time 2    Period Weeks    Status On-going    Target Date 02/13/21               PT Long Term Goals - 01/16/21 1107       PT LONG TERM GOAL #1   Title I with final HEP    Baseline Initiated.    Time 10    Period Weeks    Status New    Target Date 03/27/21      PT LONG TERM GOAL #2   Title Decrease report of pain to < 3/10 during standing, walking activities.    Baseline up to 8/10    Time 10    Period Weeks    Status New  Target Date 03/27/21      PT LONG TERM GOAL #3   Title Increase LLE strength to 4+/5 throughout    Baseline (4-)-4/5    Time 10    Period Weeks    Status New    Target Date 03/27/21      PT LONG TERM GOAL #4   Title Patient will demonstrate safe lifting of 20# using proper body mechanics, to allow her to perform expected job requirements without re-injury.    Baseline Compression fractures after carrying heavy mirror.    Time 10    Period Weeks    Status New    Target Date 03/27/21                   Plan - 02/01/21 1104     Clinical Impression Statement Patient reports she feels she is progressing. Educated her to performing foam rolling for gluts, piriformis and all around ITB. Patient returned demo and then performed stretching activities. Also added strengthening for core stability to HEP.    Stability/Clinical Decision Making Stable/Uncomplicated    Clinical Decision Making Low    Rehab Potential Good    PT Frequency 2x / week    PT Duration 8 weeks    PT Treatment/Interventions ADLs/Self Care Home Management;Electrical Stimulation;Moist Heat;Iontophoresis 4mg /ml Dexamethasone;Gait training;Stair training;Taping;Vasopneumatic Device;Patient/family  education;Functional mobility training;Therapeutic activities;Therapeutic exercise;Balance training;Neuromuscular re-education;Manual techniques;Passive range of motion;Dry needling    PT Next Visit Plan Continue trunk stability.    PT Home Exercise Plan [email or web address]    Consulted and Agree with Plan of Care Patient             Patient will benefit from skilled therapeutic intervention in order to improve the following deficits and impairments:     Visit Diagnosis: Muscle weakness (generalized)  Difficulty in walking, not elsewhere classified  Pain in left leg     Problem List Patient Active Problem List   Diagnosis Date Noted   Compression fracture of T12 vertebra (HCC) 12/11/2020   Compression fracture of L1 lumbar vertebra (HCC) 12/11/2020   Estrogen deficiency 12/11/2020   Vitamin D deficiency 12/11/2020   History of thoracic spinal fusion 11/23/2020   Piriformis syndrome of left side 11/14/2020   GERD with stricture 03/04/2019   Esophageal dysphagia 01/07/2019   Essential familial hyperlipidemia 04/24/2018   Hypertriglyceridemia, familial 04/24/2018   Anxiety 04/23/2018   H/O scoliosis 04/23/2018   Impaired fasting glucose 04/23/2018   Exposure to hepatitis C 03/25/2018   Menopausal symptoms 03/25/2018   Pain of left side of body 03/25/2018   Hx of colonic polyps 06/28/2013    08/28/2013, DPT 02/01/2021, 12:51 PM  [facility] [place] [facility] [address] [place], [place], [place] Phone: [phone]   Fax:  [phone]  Name: Roberta Sims MRN: Patsi Sears Date of Birth: 03/31/1970

## 2021-02-01 NOTE — Patient Instructions (Signed)
Access Code: [email or web address] URL: [email or web address] Date: 02/01/2021 Prepared by: Oley Balm  Exercises Prone Scapular Retraction Y - 1 x daily - 7 x weekly - 1 sets - 5 reps Prone I Scapular Retraction with Arms Overhead - 1 x daily - 7 x weekly - 1 sets - 5 reps Prone Scapular Retraction Arms at Side - 1 x daily - 7 x weekly - 1 sets - 5 reps Standing 3-Way Leg Reach with Resistance at Ankles and Counter Support - 1 x daily - 7 x weekly - 1 sets - 5 reps

## 2021-02-06 ENCOUNTER — Ambulatory Visit: Payer: BC Managed Care – PPO | Admitting: Physical Therapy

## 2021-02-06 NOTE — Telephone Encounter (Signed)
Left message for patient to call back re: below.  

## 2021-02-08 ENCOUNTER — Ambulatory Visit: Payer: BC Managed Care – PPO | Admitting: Physical Therapy

## 2021-02-13 NOTE — Telephone Encounter (Signed)
Pt informed of below.  She is going to do some more research and decide if she wants to start Evenity maybe next year, 2023. She will call us and let us know.

## 2021-03-07 ENCOUNTER — Encounter: Payer: Self-pay | Admitting: Family Medicine

## 2021-03-07 ENCOUNTER — Ambulatory Visit (HOSPITAL_BASED_OUTPATIENT_CLINIC_OR_DEPARTMENT_OTHER)
Admission: RE | Admit: 2021-03-07 | Discharge: 2021-03-07 | Disposition: A | Discharge status: Home / Self Care | Payer: 59 | Source: Ambulatory Visit | Attending: Family Medicine | Admitting: Family Medicine

## 2021-03-07 ENCOUNTER — Ambulatory Visit (INDEPENDENT_AMBULATORY_CARE_PROVIDER_SITE_OTHER): Payer: 59 | Admitting: Family Medicine

## 2021-03-07 ENCOUNTER — Other Ambulatory Visit: Payer: Self-pay

## 2021-03-07 VITALS — BP 110/78 | Ht 69.0 in | Wt 152.0 lb

## 2021-03-07 DIAGNOSIS — M546 Pain in thoracic spine: Secondary | ICD-10-CM

## 2021-03-07 IMAGING — DX DG THORACIC SPINE 2V
3 series · 4 of 4 positions shown · non-contrast
Comparison: None.

CLINICAL DATA: Back pain. History of compression fracture at
T12-L1.

EXAM:
THORACIC SPINE 2 VIEWS

[Series 1: t-spine ap · 0.14mm/px · 2 of 2 slices shown]
[im 1/2]
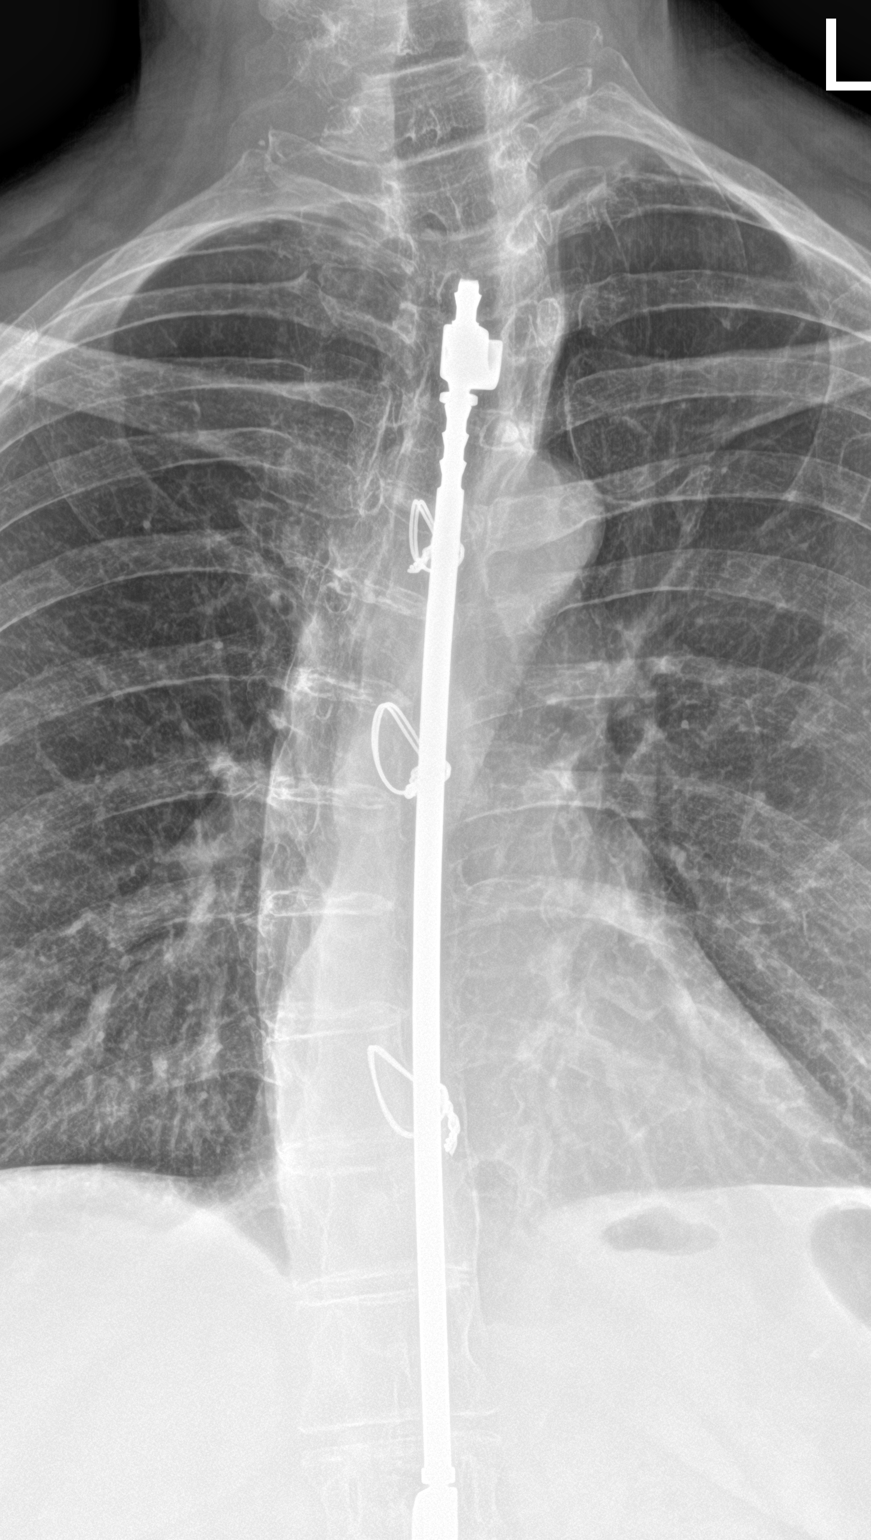
[im 2/2]
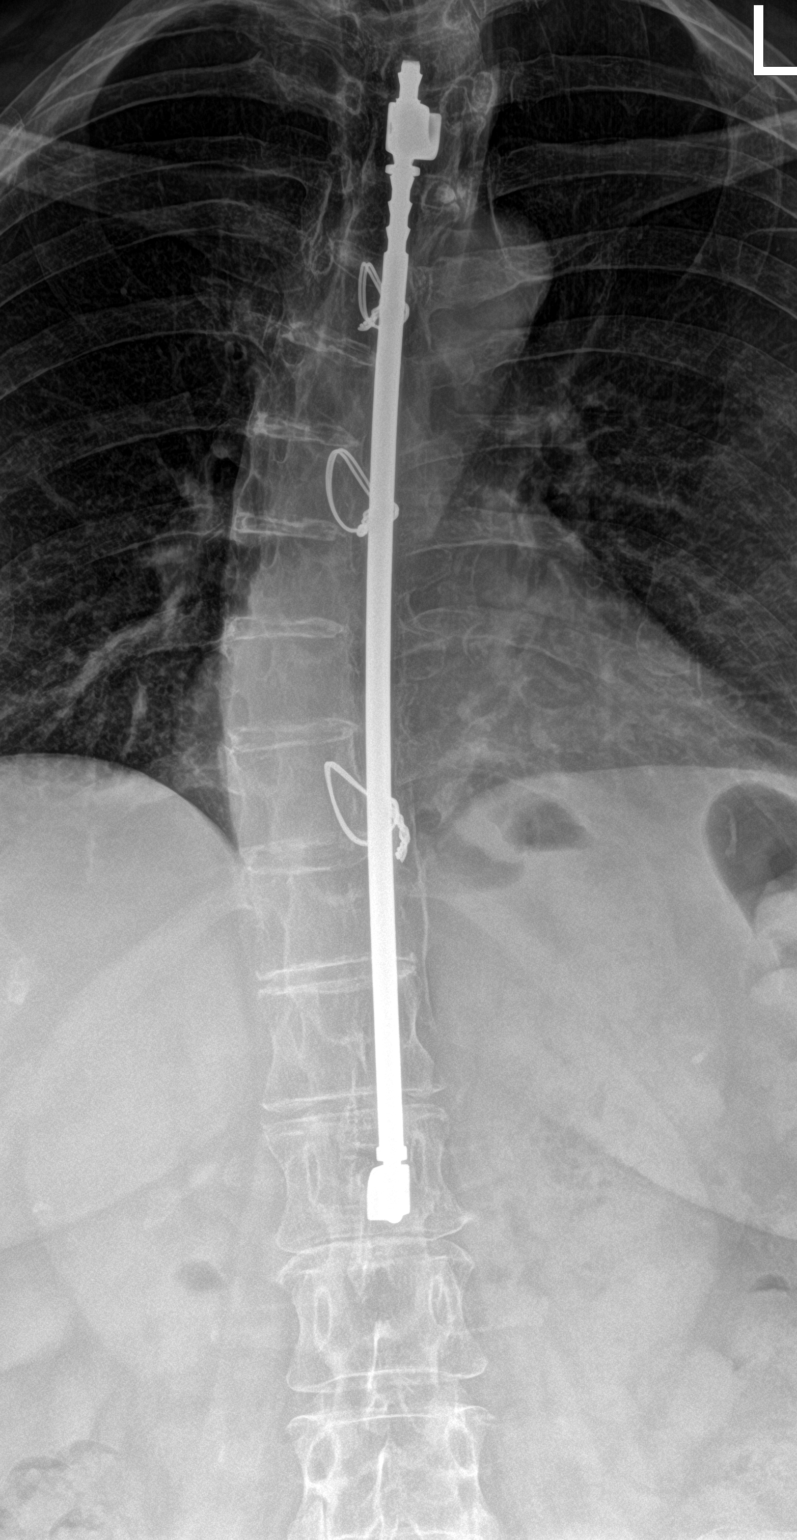

[t-spine lat]
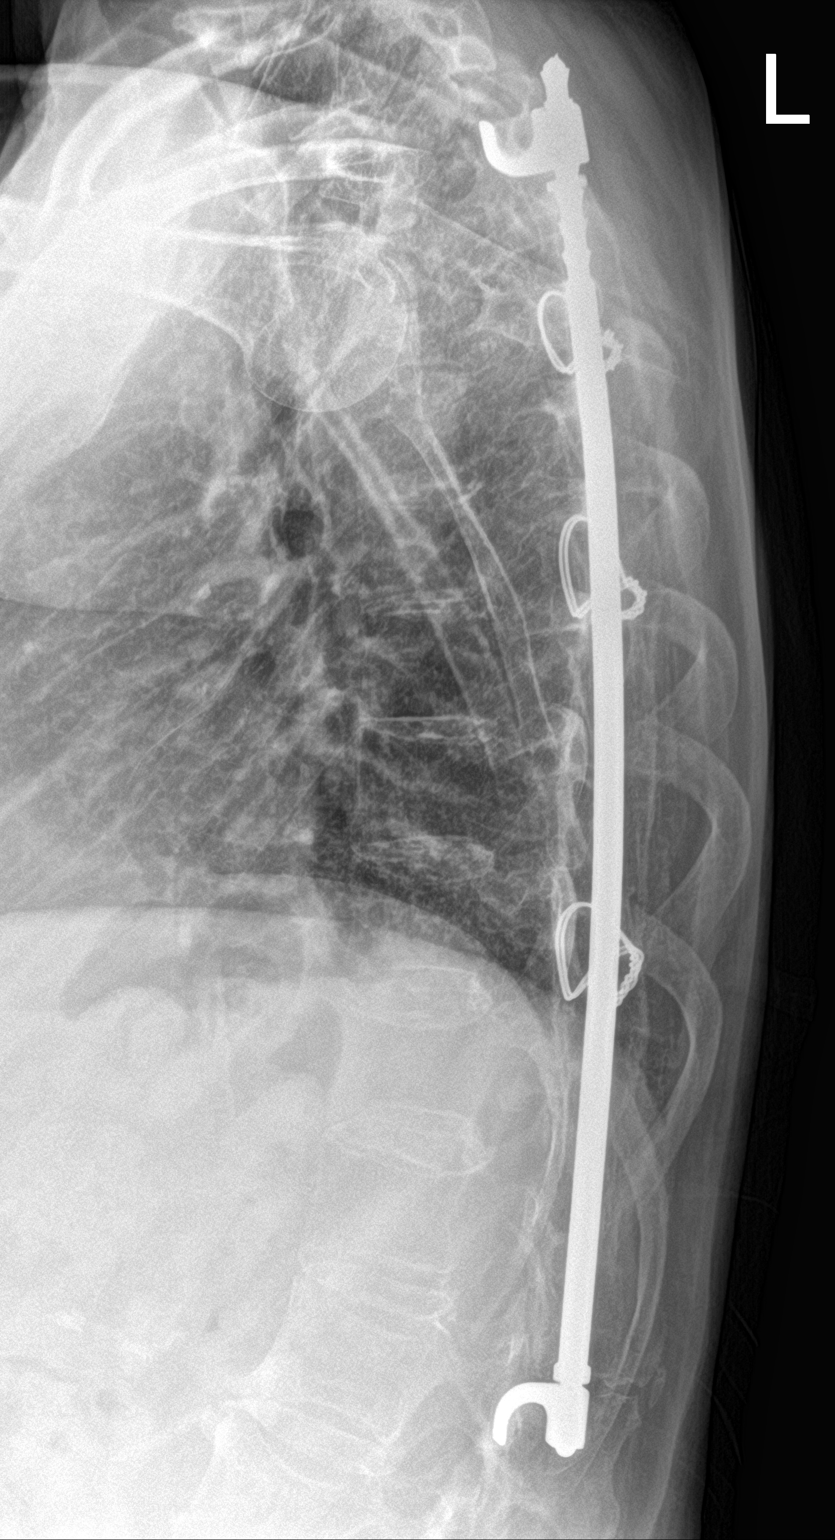

[t-spine swimmers]
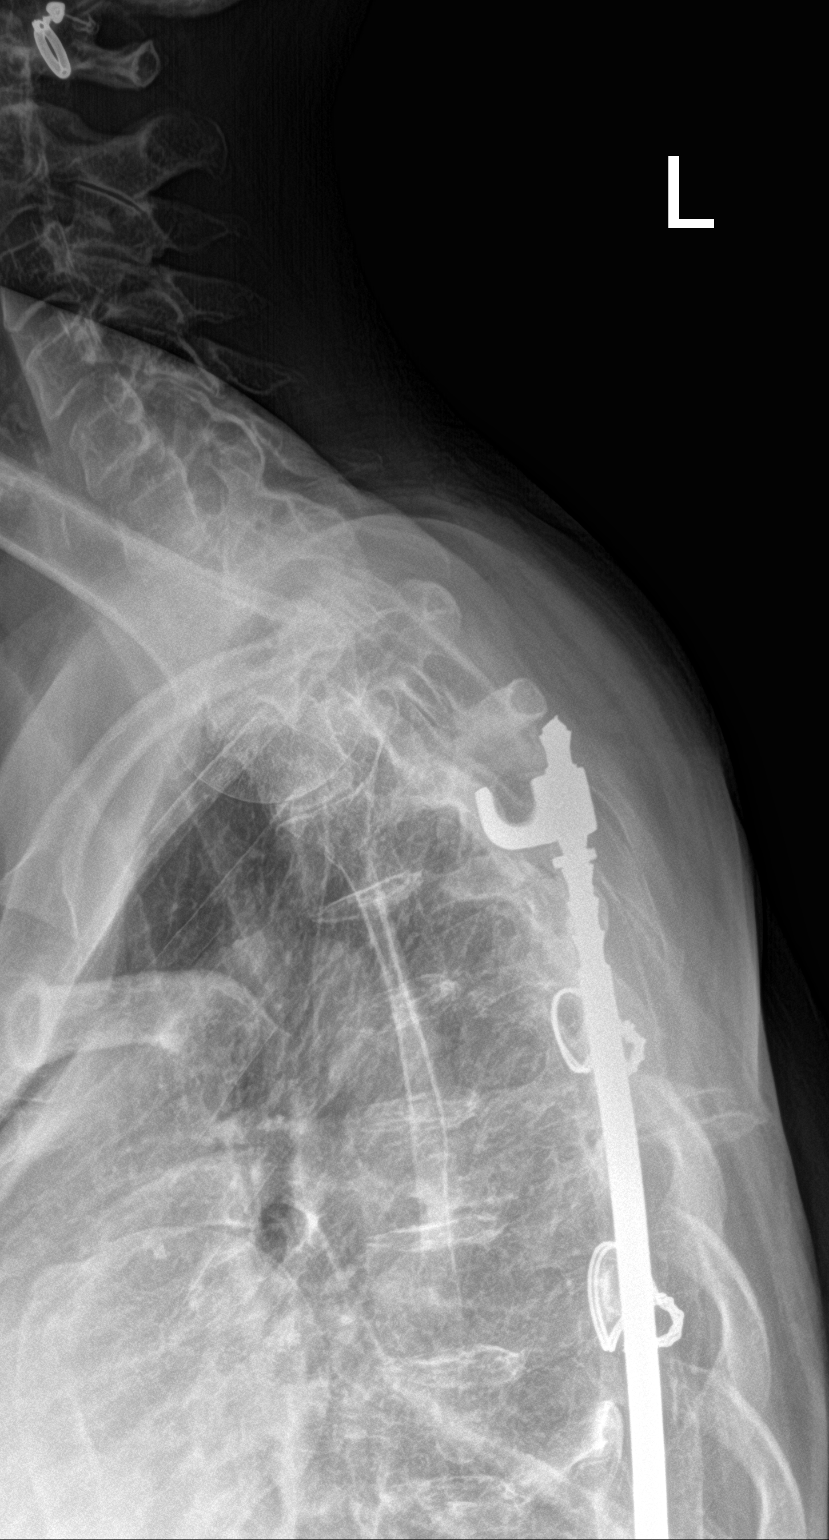

[4 of 4 positions shown; findings below may reference images not displayed]

FINDINGS: Surgical hardware in the thoracolumbar spine is in good position. No
acute fracture. Curvature of the thoracic spine, apex to the right.
No other abnormalities.
IMPRESSION: Surgical hardware in good position.  No acute fractures noted.

## 2021-03-07 MED ORDER — HYDROCODONE-ACETAMINOPHEN 5-325 MG PO TABS: 1.0000 | ORAL_TABLET | Freq: Three times a day (TID) | ORAL | 0 refills | Status: DC | PRN

## 2021-03-07 NOTE — Patient Instructions (Signed)
Good to see you  Please try heat  Please consider a brace  Please use the pain medicine as needed  I will call with the results.  Please send me a message in MyChart with any questions or updates.  Please see me back in 4 weeks.   --Dr. Jordan Likes

## 2021-03-07 NOTE — Progress Notes (Signed)
°  Roberta Sims - 51 y.o. female MRN [id number]  Date of birth: September 07, 1970  SUBJECTIVE:  Including CC & ROS.  No chief complaint on file.   Roberta Sims is a 51 y.o. female that is presenting with acute thoracic back pain and osteoporosis.  She had an acute injury and now presenting with acute severe back pain.  It is thoracic in nature.  She does have a history of compression fracture.    Review of Systems See HPI   HISTORY: Past Medical, Surgical, Social, and Family History Reviewed & Updated per EMR.   Pertinent Historical Findings include:  Past Medical History:  Diagnosis Date   GERD with stricture 03/04/2019   Hx of sessile serrated colonic polyp 06/28/2013   Scoliosis     Past Surgical History:  Procedure Laterality Date   COLONOSCOPY     ESOPHAGOGASTRODUODENOSCOPY     harrington rod--scoliosis       PHYSICAL EXAM:  VS: BP 110/78 (BP Location: Left Arm, Patient Position: Sitting)    Ht 5\' 9"  (1.753 m)    Wt 152 lb (68.9 kg)    BMI 22.45 kg/m  Physical Exam Gen: NAD, alert, cooperative with exam, well-appearing MSK:  Neurovascularly intact       ASSESSMENT & PLAN:   Acute bilateral thoracic back pain Acutely occurring and can be severe in nature. -Counseled on home exercise therapy and supportive care. -X-ray. -Norco. -Counseled on brace. -Could consider further imaging.

## 2021-03-07 NOTE — Assessment & Plan Note (Signed)
Acutely occurring and can be severe in nature. -Counseled on home exercise therapy and supportive care. -X-ray. -Norco. -Counseled on brace. -Could consider further imaging.

## 2021-03-08 ENCOUNTER — Telehealth: Payer: Self-pay | Admitting: Family Medicine

## 2021-03-08 NOTE — Telephone Encounter (Signed)
Informed of results.   Rosemarie Ax, MD [facility] 03/08/2021, 1:09 PM

## 2021-03-15 ENCOUNTER — Encounter: Payer: Self-pay | Admitting: Family Medicine

## 2021-03-15 ENCOUNTER — Other Ambulatory Visit: Payer: Self-pay | Admitting: Family Medicine

## 2021-03-15 DIAGNOSIS — S22080D Wedge compression fracture of T11-T12 vertebra, subsequent encounter for fracture with routine healing: Secondary | ICD-10-CM

## 2021-03-15 NOTE — Progress Notes (Signed)
Having new and old thoracic back pain with compression fracture at T12 with prior surgery. Pursue MRi for acute compression fracture.   Myra Rude, MD [facility] 03/15/2021, 12:33 PM

## 2021-04-04 ENCOUNTER — Encounter: Payer: Self-pay | Admitting: Family Medicine

## 2021-04-10 ENCOUNTER — Telehealth: Payer: Self-pay | Admitting: Family Medicine

## 2021-04-10 NOTE — Telephone Encounter (Signed)
Pt cld states she rcvd Approval letter frm Friday health plan on her MRI for specific date and wants to get it scheduled sooner rather than later.  ----Advised Patient we have Not received Auth# nor approval letter frm [facility] & if she has letter w/Auth # to please share that info with [place].  ----It can be put in Epic for Radiology dept to see and contact her for scheduling. (Waiting on pt's proof of MRI approval )   FYI   --glh

## 2021-04-12 ENCOUNTER — Other Ambulatory Visit: Payer: Self-pay | Admitting: Family Medicine

## 2021-04-12 MED ORDER — DIAZEPAM 5 MG PO TABS: ORAL_TABLET | ORAL | 0 refills | Status: DC

## 2021-04-12 NOTE — Progress Notes (Signed)
Valium for MRI.   Myra Rude, MD [facility] 04/12/2021, 2:43 PM

## 2021-04-17 ENCOUNTER — Other Ambulatory Visit: Payer: 59

## 2021-04-21 ENCOUNTER — Other Ambulatory Visit: Payer: Self-pay

## 2021-04-21 ENCOUNTER — Ambulatory Visit (INDEPENDENT_AMBULATORY_CARE_PROVIDER_SITE_OTHER): Payer: 59

## 2021-04-21 DIAGNOSIS — S22080D Wedge compression fracture of T11-T12 vertebra, subsequent encounter for fracture with routine healing: Secondary | ICD-10-CM | POA: Diagnosis not present

## 2021-04-21 IMAGING — MR MR THORACIC SPINE W/O CM
5 series · 38 of 48 positions shown · non-contrast
Comparison: Radiography [DATE].  CT [DATE].

CLINICAL DATA: Congenital anomaly, thoracic. Compression fracture,
thoracic. Back pain radiating to the left buttock and leg

EXAM:
MRI THORACIC SPINE WITHOUT CONTRAST
TECHNIQUE: Multiplanar, multisequence MR imaging of the thoracic spine was
performed. No intravenous contrast was administered.

[Series 5: T2 · sagittal · 3.0mm · 1.06mm/px · 8 of 21 slices shown (1 of 2)]
[im 1/21]
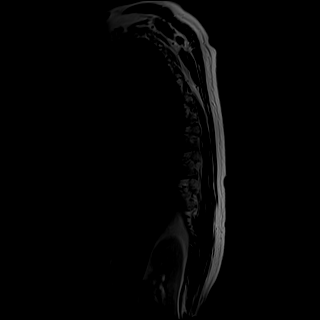
[im 3/21]
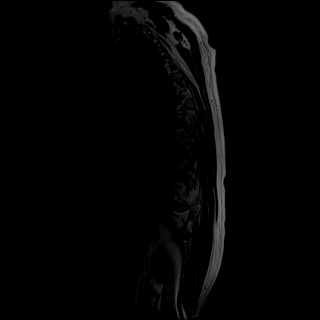
[im 6/21]
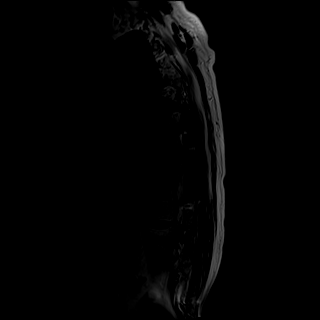
[im 9/21]
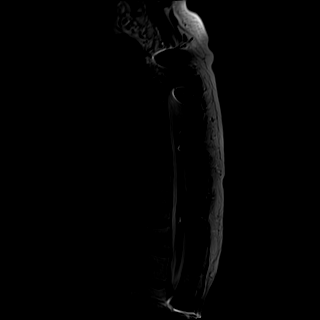
[im 12/21]
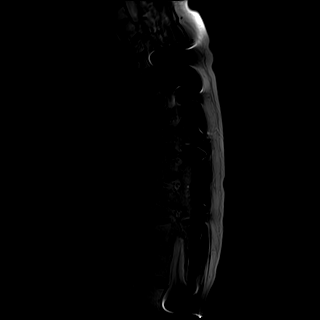
[im 15/21]
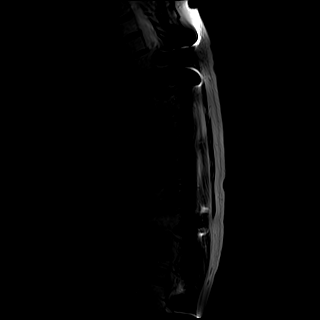
[im 18/21]
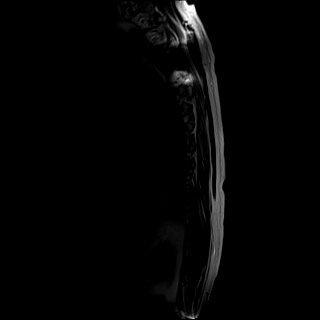
[im 21/21]
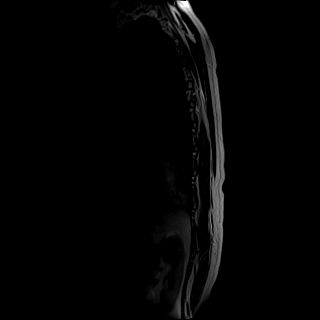

[Series 6: T1 · sagittal · 3.0mm · 1.06mm/px · 7 of 21 slices shown]
[im 1/21]
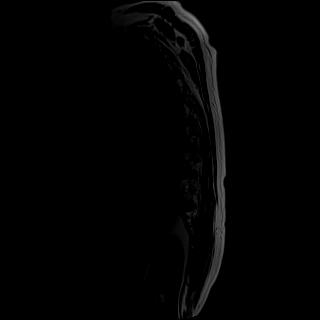
[im 4/21]
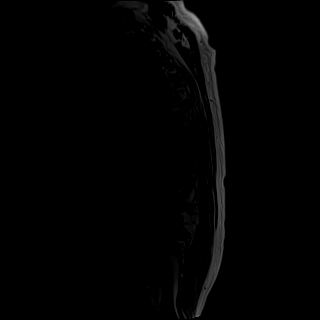
[im 7/21]
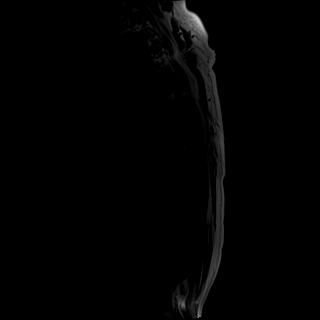
[im 11/21]
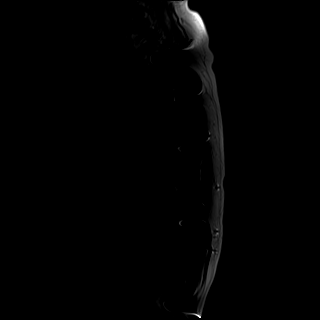
[im 14/21]
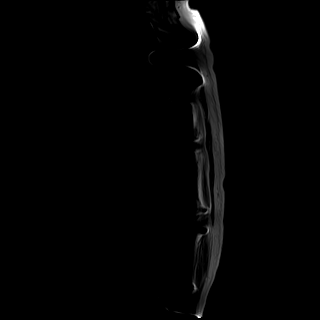
[im 17/21]
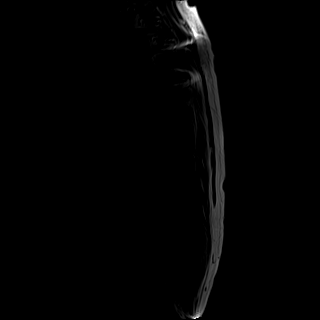
[im 21/21]
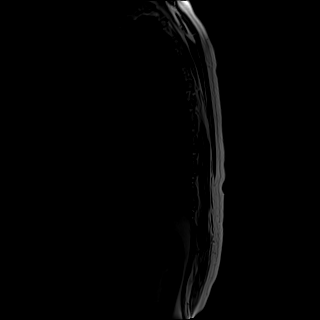

[Series 7: STIR · sagittal · 3.0mm · 1.06mm/px · 7 of 21 slices shown]
[im 1/21]
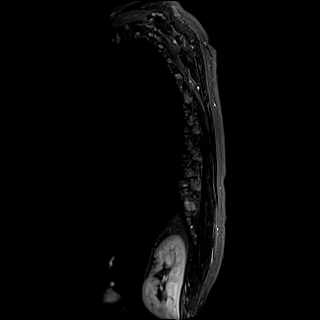
[im 4/21]
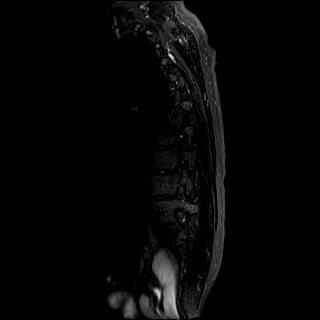
[im 7/21]
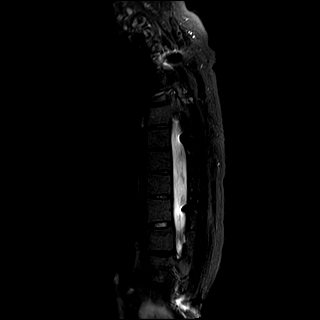
[im 11/21]
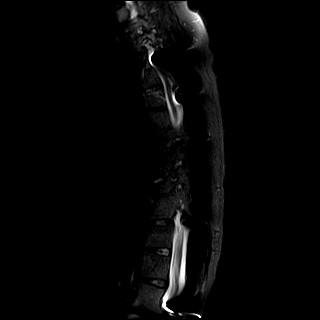
[im 14/21]
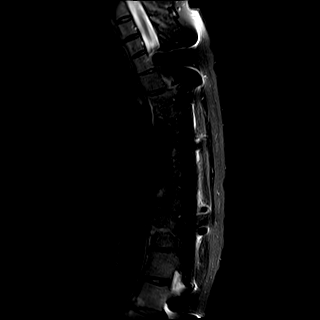
[im 17/21]
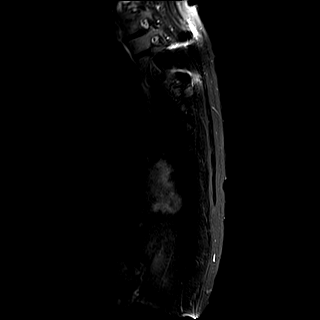
[im 21/21]
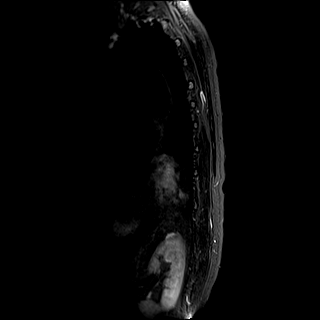

[Series 8: T2 · axial · 5.0mm · 0.78mm/px · z∈[-327,-114]mm · 9 of 39 slices shown (2 of 2)]
[im 1/39]
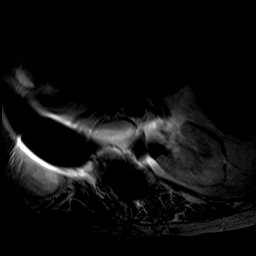
[im 7/39]
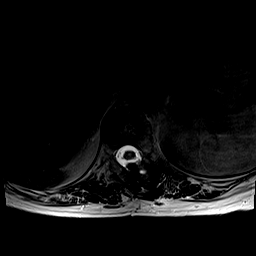
[im 13/39]
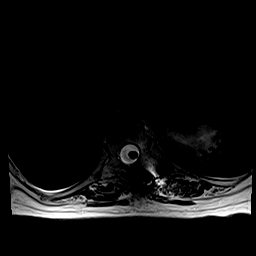
[im 16/39]
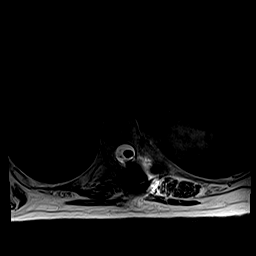
[im 20/39]
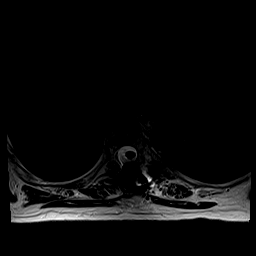
[im 23/39]
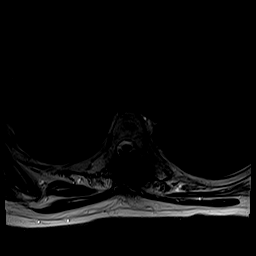
[im 26/39]
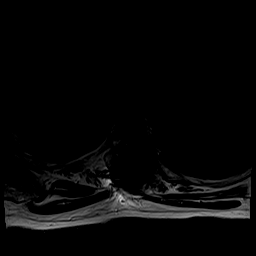
[im 32/39]
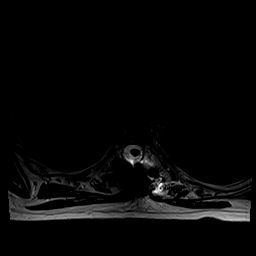
[im 39/39]
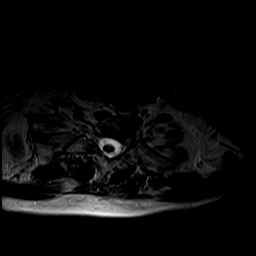

[Series 9: mpgr ax · axial · 5.0mm · 0.39mm/px · z∈[-327,-133]mm · 7 of 39 slices shown]
[im 1/39]
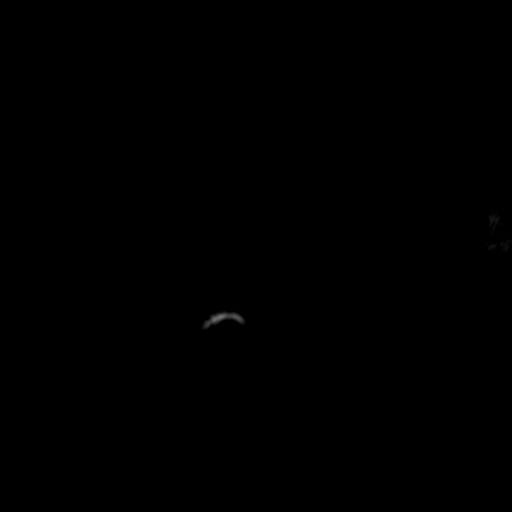
[im 7/39]
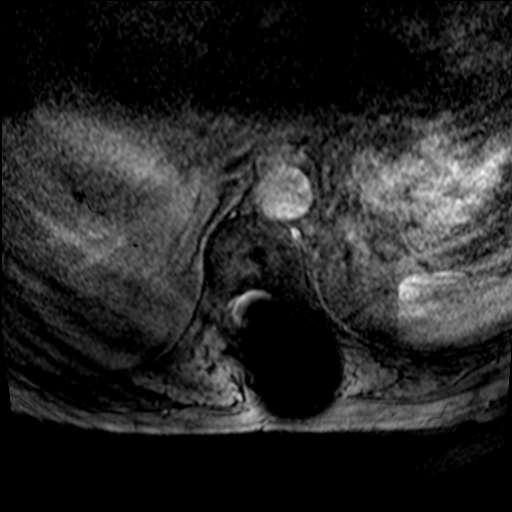
[im 13/39]
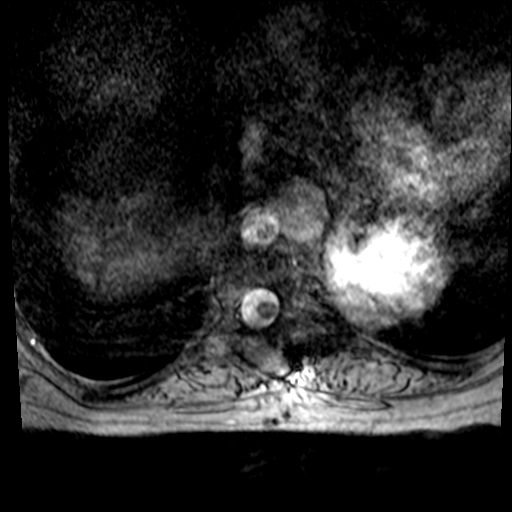
[im 16/39]
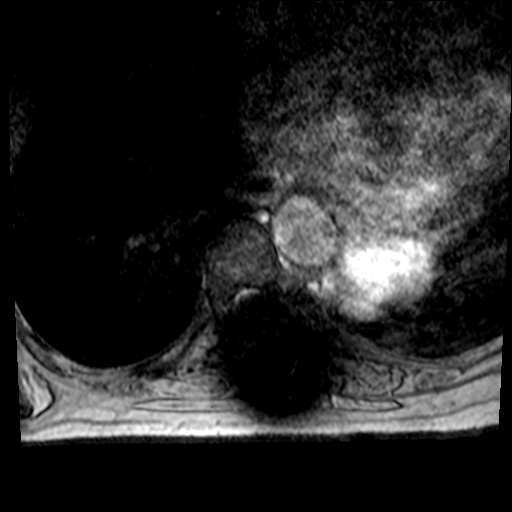
[im 23/39]
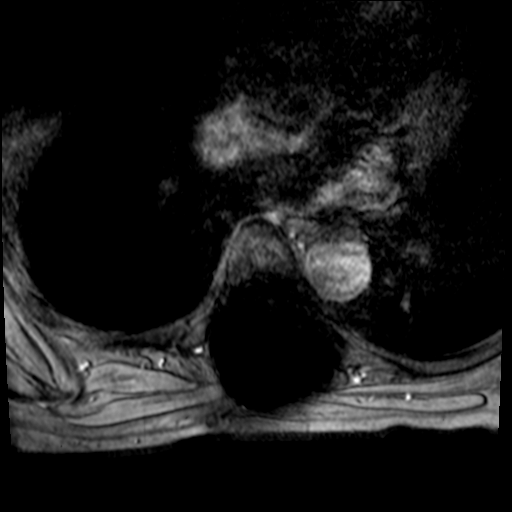
[im 26/39]
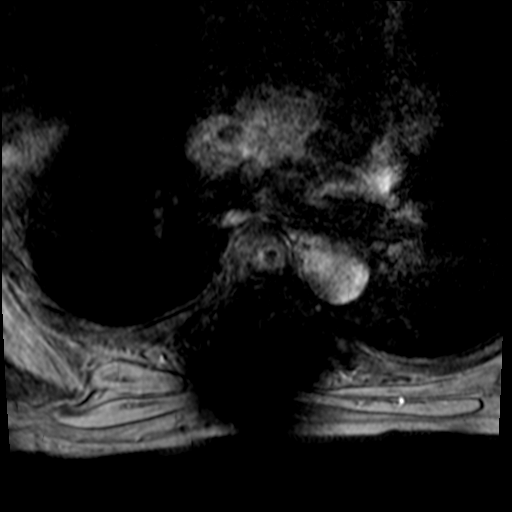
[im 32/39]
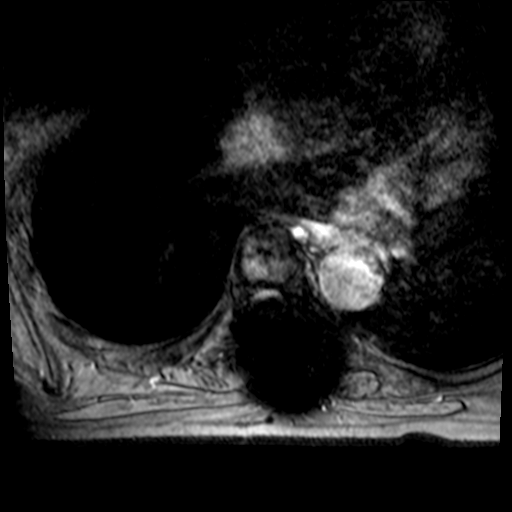

[38 of 48 positions shown; findings below may reference images not displayed]

FINDINGS: Alignment: Distant spinal fusion for treatment of scoliosis convex
to the right. No antero or retrolisthesis.

Vertebrae: There appears to be solid union subsequent to the
surgery. Old healed minor superior endplate deformities at T11 and
posteriorly at T12 are noted. No evidence of recent fracture. No
retropulsed bone.

Cord:  No cord compression or focal cord lesion.

Paraspinal and other soft tissues: Negative except for a hiatal
hernia.

Disc levels:

No disc level pathology is seen. No stenosis of the canal or
foramina.
IMPRESSION: Previous fusion for treatment of scoliosis convex to the right.
Apparent solid union throughout the treated region.

Old healed minor superior endplate compression fractures at T11 and
T12. This numbering is accurate, counting down from the skull base.

Hiatal hernia incidentally noted.

## 2021-04-24 ENCOUNTER — Encounter: Payer: Self-pay | Admitting: Family Medicine

## 2021-04-24 ENCOUNTER — Telehealth (INDEPENDENT_AMBULATORY_CARE_PROVIDER_SITE_OTHER): Payer: 59 | Admitting: Family Medicine

## 2021-04-24 DIAGNOSIS — S22080D Wedge compression fracture of T11-T12 vertebra, subsequent encounter for fracture with routine healing: Secondary | ICD-10-CM | POA: Diagnosis not present

## 2021-04-24 DIAGNOSIS — M546 Pain in thoracic spine: Secondary | ICD-10-CM

## 2021-04-24 NOTE — Assessment & Plan Note (Signed)
MRI was revealing for no acute changes. -Counseled on home exercise therapy and supportive care. -Could consider physical therapy.

## 2021-04-24 NOTE — Progress Notes (Signed)
Virtual Visit via Video Note  I connected with Roberta Sims on 04/24/21 at  3:50 PM EST by a video enabled telemedicine application and verified that I am speaking with the correct person using two identifiers.  Location: Patient: vehicle  Provider: office   I discussed the limitations of evaluation and management by telemedicine and the availability of in person appointments. The patient expressed understanding and agreed to proceed.  History of Present Illness:  Roberta Sims is a 51 year old female that is following up after the MRI of her thoracic spine.  This was showing the previous fusion for scoliosis but no new changes.   Observations/Objective:   Assessment and Plan:  Compression fracture of T12 vertebra:  MRI of the thoracic spine was showing a old healed compression at T11 and T12. -Counseled on home exercise therapy and supportive care. -We will continue to monitor and discuss treatment options.  Thoracic bilateral back pain.: MRI was revealing for no acute changes. -Counseled on home exercise therapy and supportive care. -Could consider physical therapy.  Follow Up Instructions:    I discussed the assessment and treatment plan with the patient. The patient was provided an opportunity to ask questions and all were answered. The patient agreed with the plan and demonstrated an understanding of the instructions.   The patient was advised to call back or seek an in-person evaluation if the symptoms worsen or if the condition fails to improve as anticipated.    Roberta Gandy, MD

## 2021-04-24 NOTE — Assessment & Plan Note (Signed)
MRI of the thoracic spine was showing a old healed compression at T11 and T12. -Counseled on home exercise therapy and supportive care. -We will continue to monitor and discuss treatment options.

## 2021-05-31 NOTE — Telephone Encounter (Signed)
Hi Dr. Jordan Likes,  ?I see that pt had video visit 04/24/21, has pt decided against Evenity? ? ?

## 2021-06-04 NOTE — Telephone Encounter (Signed)
Left VM for patient. If she calls back please have her speak with a nurse/CMA and ask if she has decided if she would like to pursue evenity.  ? ?If any questions then please take the best time and phone number to call and I will try to call her back.  ? ?Myra Rude, MD ?[facility] ?06/04/2021, 5:17 PM ? ? ?

## 2021-06-05 NOTE — Telephone Encounter (Signed)
Patient has decided to hold off on Evenity at this time. She is going to try other herbal approaches and reconvene with [place] after she has her PCP check labs. She states she will call us if/when she decides to start Evenity.  ?

## 2021-06-06 NOTE — Telephone Encounter (Signed)
Pt archived in [email or web address]. ° °Please advise if patient and/or provider wish to proceed with Evenity therpay. ° °

## 2021-06-14 ENCOUNTER — Ambulatory Visit: Payer: 59 | Admitting: Family

## 2021-06-22 ENCOUNTER — Ambulatory Visit (INDEPENDENT_AMBULATORY_CARE_PROVIDER_SITE_OTHER): Payer: 59 | Admitting: Nurse Practitioner

## 2021-06-22 ENCOUNTER — Encounter: Payer: Self-pay | Admitting: Nurse Practitioner

## 2021-06-22 VITALS — BP 120/80 | HR 103 | Temp 98.3°F | Ht 69.0 in | Wt 155.6 lb

## 2021-06-22 DIAGNOSIS — S22080A Wedge compression fracture of T11-T12 vertebra, initial encounter for closed fracture: Secondary | ICD-10-CM

## 2021-06-22 DIAGNOSIS — J029 Acute pharyngitis, unspecified: Secondary | ICD-10-CM

## 2021-06-22 DIAGNOSIS — S32010A Wedge compression fracture of first lumbar vertebra, initial encounter for closed fracture: Secondary | ICD-10-CM

## 2021-06-22 DIAGNOSIS — J0140 Acute pansinusitis, unspecified: Secondary | ICD-10-CM

## 2021-06-22 LAB — POCT RAPID STREP A (OFFICE): Rapid Strep A Screen: NEGATIVE

## 2021-06-22 LAB — POC COVID19 BINAXNOW: SARS Coronavirus 2 Ag: NEGATIVE

## 2021-06-22 MED ORDER — HYDROCOD POLI-CHLORPHE POLI ER 10-8 MG/5ML PO SUER: 5.0000 mL | Freq: Two times a day (BID) | ORAL | 0 refills | Status: DC | PRN

## 2021-06-22 MED ORDER — AMOXICILLIN-POT CLAVULANATE 875-125 MG PO TABS: 1.0000 | ORAL_TABLET | Freq: Two times a day (BID) | ORAL | 0 refills | Status: DC

## 2021-06-22 MED ORDER — CYCLOBENZAPRINE HCL 10 MG PO TABS: 10.0000 mg | ORAL_TABLET | Freq: Three times a day (TID) | ORAL | 0 refills | Status: DC | PRN

## 2021-06-22 MED ORDER — BENZONATATE 100 MG PO CAPS: 100.0000 mg | ORAL_CAPSULE | Freq: Two times a day (BID) | ORAL | 0 refills | Status: DC | PRN

## 2021-06-22 NOTE — Progress Notes (Signed)
? ?Acute Office Visit ? ?Subjective:  ? ?  ?Patient ID: Roberta Sims, female    DOB: 21-May-1970, 51 y.o.   MRN: [id number] ? ?Chief Complaint  ?Patient presents with  ? Acute Visit  ?  Pt c/o sinus infection, sore throat, coughing, earache, pressure behind eyes started Saturday.  ? ? ?HPI ?Patient is in today for sore throat, sinus pressure, and earache since Saturday.  ? ?UPPER RESPIRATORY TRACT INFECTION ? ?Fever: no ?Cough: yes ?Shortness of breath: no ?Wheezing: no ?Chest pain: yes, with cough ?Chest tightness: yes ?Chest congestion: no ?Nasal congestion: yes ?Runny nose: yes ?Post nasal drip: yes ?Sneezing: yes ?Sore throat: yes ?Swollen glands: no ?Sinus pressure: yes ?Headache: yes ?Face pain: yes ?Toothache: yes ?Ear pain: yes left ?Ear pressure: no bilateral ?Eyes red/itching:yes ?Eye drainage/crusting: yes  ?Vomiting: no ?Rash: no ?Fatigue: yes ?Sick contacts: no ?Strep contacts: no  ?Context: worse ?Recurrent sinusitis: no ?Relief with OTC cold/cough medications: no  ?Treatments attempted: zyrtec, nasal cleanse, advil, cold and allergy medication ? ? ?ROS ?See pertinent positives and negatives per HPI. ? ?   ?Objective:  ?  ?BP 120/80 (BP Location: Left Arm, Patient Position: Sitting, Cuff Size: Normal)   Pulse (!) 103   Temp 98.3 ?F (36.8 ?C) (Temporal)   Ht 5\' 9"  (1.753 m)   Wt 155 lb 9.6 oz (70.6 kg)   SpO2 95%   BMI 22.98 kg/m?  ?  ? ?Physical Exam ?Vitals and nursing note reviewed.  ?Constitutional:   ?   General: She is not in acute distress. ?   Appearance: Normal appearance.  ?HENT:  ?   Head: Normocephalic.  ?   Right Ear: Tympanic membrane, ear canal and external ear normal.  ?   Left Ear: Ear canal and external ear normal. Tympanic membrane is erythematous.  ?   Nose:  ?   Right Sinus: Maxillary sinus tenderness and frontal sinus tenderness present.  ?   Left Sinus: Maxillary sinus tenderness and frontal sinus tenderness present.  ?Eyes:  ?   Conjunctiva/sclera: Conjunctivae normal.   ?Cardiovascular:  ?   Rate and Rhythm: Normal rate and regular rhythm.  ?   Pulses: Normal pulses.  ?   Heart sounds: Normal heart sounds.  ?Pulmonary:  ?   Effort: Pulmonary effort is normal.  ?   Breath sounds: Normal breath sounds.  ?Musculoskeletal:  ?   Cervical back: Normal range of motion. No tenderness.  ?Lymphadenopathy:  ?   Cervical: No cervical adenopathy.  ?Skin: ?   General: Skin is warm.  ?Neurological:  ?   General: No focal deficit present.  ?   Mental Status: She is alert and oriented to person, place, and time.  ?Psychiatric:     ?   Mood and Affect: Mood normal.     ?   Behavior: Behavior normal.     ?   Thought Content: Thought content normal.     ?   Judgment: Judgment normal.  ? ? ?Results for orders placed or performed in visit on 06/22/21  ?POCT rapid strep A  ?Result Value Ref Range  ? Rapid Strep A Screen Negative Negative  ?POC COVID-19 BinaxNow  ?Result Value Ref Range  ? SARS Coronavirus 2 Ag Negative Negative  ? ?   ?Assessment & Plan:  ? ?Problem List Items Addressed This Visit   ? ?  ? Musculoskeletal and Integument  ? Compression fracture of T12 vertebra (HCC)  ? Relevant Medications  ? cyclobenzaprine (FLEXERIL)  10 MG tablet  ? Compression fracture of L1 lumbar vertebra (HCC)  ? Relevant Medications  ? cyclobenzaprine (FLEXERIL) 10 MG tablet  ? ?Other Visit Diagnoses   ? ? Acute non-recurrent pansinusitis    -  Primary  ? Symptoms ongoing for over 7 days and getting worse.Start Augmentin BID x10 days.  Continue Zyrtec, nasal rinse, Advil. Tessalon/tussionex prn cough. PDMP review  ? Relevant Medications  ? amoxicillin-clavulanate (AUGMENTIN) 875-125 MG tablet  ? benzonatate (TESSALON) 100 MG capsule  ? chlorpheniramine-HYDROcodone (TUSSIONEX PENNKINETIC ER) 10-8 MG/5ML  ? Sore throat      ? COVID and strep negative.  She can take Advil, gargle with warm salt water to help with sore throat.  Continue nasal rinses.  ? Relevant Orders  ? POCT rapid strep A (Completed)  ? POC COVID-19  BinaxNow (Completed)  ? ?  ? ? ?Meds ordered this encounter  ?Medications  ? amoxicillin-clavulanate (AUGMENTIN) 875-125 MG tablet  ?  Sig: Take 1 tablet by mouth 2 (two) times daily.  ?  Dispense:  20 tablet  ?  Refill:  0  ? benzonatate (TESSALON) 100 MG capsule  ?  Sig: Take 1 capsule (100 mg total) by mouth 2 (two) times daily as needed for cough.  ?  Dispense:  20 capsule  ?  Refill:  0  ? cyclobenzaprine (FLEXERIL) 10 MG tablet  ?  Sig: Take 1 tablet (10 mg total) by mouth 3 (three) times daily as needed for muscle spasms.  ?  Dispense:  30 tablet  ?  Refill:  0  ? chlorpheniramine-HYDROcodone (TUSSIONEX PENNKINETIC ER) 10-8 MG/5ML  ?  Sig: Take 5 mLs by mouth every 12 (twelve) hours as needed for cough.  ?  Dispense:  70 mL  ?  Refill:  0  ? ? ?Return if symptoms worsen or fail to improve. ? ?Gerre Scull, NP ? ? ?

## 2021-06-22 NOTE — Patient Instructions (Signed)
It was great to see you! ? ?Start augmentin twice a day with food. I have sent in tessalon to take as needed for cough during the day and tussionex liquid cough medicine to take at night. Make sure you are drinking plenty of fluids. You can continue zyrtec, nasal rinse, and advil.  ? ?Let's follow-up if your symptoms worsen or don't improve ? ?Take care, ? ?Rodman Pickle, NP ? ?

## 2021-06-28 ENCOUNTER — Ambulatory Visit: Payer: 59 | Admitting: Family

## 2021-06-29 ENCOUNTER — Other Ambulatory Visit: Payer: Self-pay | Admitting: Family

## 2021-06-29 ENCOUNTER — Ambulatory Visit (INDEPENDENT_AMBULATORY_CARE_PROVIDER_SITE_OTHER): Payer: 59 | Admitting: Family

## 2021-06-29 ENCOUNTER — Encounter: Payer: Self-pay | Admitting: Family

## 2021-06-29 VITALS — BP 144/80 | HR 92 | Temp 98.1°F | Ht 69.0 in | Wt 151.8 lb

## 2021-06-29 DIAGNOSIS — M810 Age-related osteoporosis without current pathological fracture: Secondary | ICD-10-CM

## 2021-06-29 DIAGNOSIS — Z124 Encounter for screening for malignant neoplasm of cervix: Secondary | ICD-10-CM

## 2021-06-29 DIAGNOSIS — Z1231 Encounter for screening mammogram for malignant neoplasm of breast: Secondary | ICD-10-CM

## 2021-06-29 DIAGNOSIS — G4730 Sleep apnea, unspecified: Secondary | ICD-10-CM

## 2021-06-29 DIAGNOSIS — F419 Anxiety disorder, unspecified: Secondary | ICD-10-CM

## 2021-06-29 DIAGNOSIS — G4700 Insomnia, unspecified: Secondary | ICD-10-CM

## 2021-06-29 DIAGNOSIS — R1310 Dysphagia, unspecified: Secondary | ICD-10-CM | POA: Diagnosis not present

## 2021-06-29 DIAGNOSIS — R7989 Other specified abnormal findings of blood chemistry: Secondary | ICD-10-CM | POA: Diagnosis not present

## 2021-06-29 DIAGNOSIS — Z800 Family history of malignant neoplasm of digestive organs: Secondary | ICD-10-CM | POA: Diagnosis not present

## 2021-06-29 DIAGNOSIS — E785 Hyperlipidemia, unspecified: Secondary | ICD-10-CM | POA: Diagnosis not present

## 2021-06-29 LAB — COMPREHENSIVE METABOLIC PANEL
ALT: 18 U/L (ref 0–35)
AST: 19 U/L (ref 0–37)
Albumin: 4.4 g/dL (ref 3.5–5.2)
Alkaline Phosphatase: 88 U/L (ref 39–117)
BUN: 17 mg/dL (ref 6–23)
CO2: 31 mEq/L (ref 19–32)
Calcium: 9.7 mg/dL (ref 8.4–10.5)
Chloride: 101 mEq/L (ref 96–112)
Creatinine, Ser: 0.72 mg/dL (ref 0.40–1.20)
GFR: 95.75 mL/min (ref >60.00)
Glucose, Bld: 96 mg/dL (ref 70–99)
Potassium: 4.8 mEq/L (ref 3.5–5.1)
Sodium: 139 mEq/L (ref 135–145)
Total Bilirubin: 0.6 mg/dL (ref 0.2–1.2)
Total Protein: 7.1 g/dL (ref 6.0–8.3)

## 2021-06-29 LAB — VITAMIN D 25 HYDROXY (VIT D DEFICIENCY, FRACTURES): VITD: 25.87 ng/mL — ABNORMAL LOW (ref 30.00–100.00)

## 2021-06-29 LAB — CBC WITH DIFFERENTIAL/PLATELET
Basophils Absolute: 0.1 10*3/uL (ref 0.0–0.1)
Basophils Relative: 1.7 % (ref 0.0–3.0)
Eosinophils Absolute: 0.5 10*3/uL (ref 0.0–0.7)
Eosinophils Relative: 7.6 % — ABNORMAL HIGH (ref 0.0–5.0)
HCT: 39.1 % (ref 36.0–46.0)
Hemoglobin: 12.7 g/dL (ref 12.0–15.0)
Lymphocytes Relative: 32.4 % (ref 12.0–46.0)
Lymphs Abs: 2 10*3/uL (ref 0.7–4.0)
MCHC: 32.6 g/dL (ref 30.0–36.0)
MCV: 92.7 fl (ref 78.0–100.0)
Monocytes Absolute: 0.6 10*3/uL (ref 0.1–1.0)
Monocytes Relative: 10.4 % (ref 3.0–12.0)
Neutro Abs: 2.9 10*3/uL (ref 1.4–7.7)
Neutrophils Relative %: 47.9 % (ref 43.0–77.0)
Platelets: 301 10*3/uL (ref 150.0–400.0)
RBC: 4.22 Mil/uL (ref 3.87–5.11)
RDW: 14.9 % (ref 11.5–15.5)
WBC: 6.1 10*3/uL (ref 4.0–10.5)

## 2021-06-29 LAB — LDL CHOLESTEROL, DIRECT: Direct LDL: 191 mg/dL

## 2021-06-29 LAB — LIPID PANEL
Cholesterol: 271 mg/dL — ABNORMAL HIGH (ref 0–200)
HDL: 44.9 mg/dL (ref >39.00)
NonHDL: 226.2
Total CHOL/HDL Ratio: 6
Triglycerides: 262 mg/dL — ABNORMAL HIGH (ref 0.0–149.0)
VLDL: 52.4 mg/dL — ABNORMAL HIGH (ref 0.0–40.0)

## 2021-06-29 LAB — LIPASE: Lipase: 9 U/L — ABNORMAL LOW (ref 11.0–59.0)

## 2021-06-29 LAB — AMYLASE: Amylase: 16 U/L — ABNORMAL LOW (ref 27–131)

## 2021-06-29 MED ORDER — VITAMIN D (ERGOCALCIFEROL) 1.25 MG (50000 UNIT) PO CAPS: 50000.0000 [IU] | ORAL_CAPSULE | ORAL | 0 refills | Status: AC

## 2021-06-29 MED ORDER — ESCITALOPRAM OXALATE 10 MG PO TABS: 10.0000 mg | ORAL_TABLET | Freq: Every day | ORAL | 3 refills | Status: DC

## 2021-06-29 MED ORDER — ZOLPIDEM TARTRATE 5 MG PO TABS: 5.0000 mg | ORAL_TABLET | Freq: Every evening | ORAL | 0 refills | Status: DC | PRN

## 2021-06-29 NOTE — Progress Notes (Addendum)
Roberta Sims is a 51 y.o. female with the following history as recorded in EpicCare:  Patient Active Problem List   Diagnosis Date Noted   Osteoporosis 06/29/2021   Hyperlipidemia 06/29/2021   Acute bilateral thoracic back pain 03/07/2021   Compression fracture of T12 vertebra ([facility]) 12/11/2020   Compression fracture of L1 lumbar vertebra (HCC) 12/11/2020   Estrogen deficiency 12/11/2020   Vitamin D deficiency 12/11/2020   History of thoracic spinal fusion 11/23/2020   Piriformis syndrome of left side 11/14/2020   GERD with stricture 03/04/2019   Esophageal dysphagia 01/07/2019   Essential familial hyperlipidemia 04/24/2018   Hypertriglyceridemia, familial 04/24/2018   Anxiety 04/23/2018   H/O scoliosis 04/23/2018   Impaired fasting glucose 04/23/2018   Exposure to hepatitis C 03/25/2018   Menopausal symptoms 03/25/2018   Pain of left side of body 03/25/2018   Hx of colonic polyps 06/28/2013    Current Outpatient Medications  Medication Sig Dispense Refill   Ascorbic Acid (VITAMIN C) 100 MG tablet Take 100 mg by mouth daily.     benzonatate (TESSALON) 100 MG capsule Take 1 capsule (100 mg total) by mouth 2 (two) times daily as needed for cough. 20 capsule 0   Cetirizine HCl (ZYRTEC PO) Take by mouth.     omeprazole (PRILOSEC) 40 MG capsule TAKE ONE CAPSULE BY MOUTH ONE TIME DAILY BEFORE BREAKFAST 90 capsule 3   zinc gluconate 50 MG tablet Take 50 mg by mouth daily.     Cholecalciferol (VITAMIN D-1000 MAX ST) 25 MCG (1000 UT) tablet Take 1 capsule by mouth daily. (Patient not taking: Reported on 06/29/2021)     cyclobenzaprine (FLEXERIL) 10 MG tablet Take 1 tablet (10 mg total) by mouth 3 (three) times daily as needed for muscle spasms. (Patient not taking: Reported on 06/29/2021) 30 tablet 0   escitalopram (LEXAPRO) 10 MG tablet Take 1 tablet (10 mg total) by mouth daily. 90 tablet 3   icosapent Ethyl (VASCEPA) 1 g capsule Take 2 capsules (2 g total) by mouth 2 (two) times daily.  (Patient not taking: Reported on 06/22/2021) 360 capsule 1   rosuvastatin (CRESTOR) 20 MG tablet Take 1 tablet (20 mg total) by mouth daily. (Patient not taking: Reported on 06/22/2021) 90 tablet 1   zolpidem (AMBIEN) 5 MG tablet Take 1 tablet (5 mg total) by mouth at bedtime as needed for sleep. (Patient not taking: Reported on 06/22/2021) 30 tablet 2   No current facility-administered medications for this visit.    Allergies: Meloxicam  Past Medical History:  Diagnosis Date   GERD with stricture 03/04/2019   Hx of sessile serrated colonic polyp 06/28/2013   Scoliosis     Past Surgical History:  Procedure Laterality Date   COLONOSCOPY     ESOPHAGOGASTRODUODENOSCOPY     harrington rod--scoliosis      Family History  Problem Relation Age of Onset   Pancreatic cancer Mother        metastasized to colon   Alcohol abuse Brother    Diabetes Brother    Colon cancer Maternal Grandmother     Social History   Tobacco Use   Smoking status: Never   Smokeless tobacco: Never  Substance Use Topics   Alcohol use: Yes    Comment: socially    Subjective:  Was seen with episode of back pain in September 2022- was found to have compression fracture; DEXA done as result and did show osteoporosis; does not want to pursue medication at this time- wants to work on  supplements/ exercise and re-check bone density at later date;   Requesting refill on her Lexapro and Ambien; Chronic sleep issues; concerns for possible apnea- does not rest well; wondering if she has underlying sleep apnea;   History of hyperlipidemia- has taken Crestor and Vascepa but not currently taking;       Objective:  Vitals:   06/29/21 0906  BP: (!) 144/80  Pulse: 92  Temp: 98.1 F (36.7 C)  TempSrc: Oral  SpO2: 94%  Weight: 151 lb 12.8 oz (68.9 kg)  Height: 5' 9"  (1.753 m)    General: Well developed, well nourished, in no acute distress  Skin : Warm and dry.  Head: Normocephalic and atraumatic  Eyes: Sclera and  conjunctiva clear; pupils round and reactive to light; extraocular movements intact  Ears: External normal; canals clear; tympanic membranes normal  Oropharynx: Pink, supple. No suspicious lesions  Neck: Supple without thyromegaly, adenopathy  Lungs: Respirations unlabored; clear to auscultation bilaterally without wheeze, rales, rhonchi  CVS exam: normal rate and regular rhythm.  Neurologic: Alert and oriented; speech intact; face symmetrical; moves all extremities well; CNII-XII intact without focal deficit   Assessment:  1. Sleep apnea, unspecified type   2. Anxiety   3. Dysphagia, unspecified type   4. Hyperlipidemia, unspecified hyperlipidemia type   5. Low vitamin D level   6. FH: pancreatic cancer   7. Visit for screening mammogram   8. Cervical cancer screening   9. Osteoporosis, unspecified osteoporosis type, unspecified pathological fracture presence     Plan:  Chronic sleep issues/ ? Underlying contributing concerns; Refer to neurology for sleep study; Stable; refill updated on Lexapro; Refer to GI- ? Need for esophageal dilatation; Update lipid panel today; will determine if medications are needed based on labs today; Check Vitamin D level; Check amylase, lipase; Order updated; Refer to GYN- needs to establish care;  Patient does not want to start medication at this time; she will work on calcicum/ Vitamin D intake and weight bearing exercises; will update DEXA in 2024;   No follow-ups on file.  Orders Placed This Encounter  Procedures   MM Digital Screening    Standing Status:   Future    Standing Expiration Date:   06/30/2022    Order Specific Question:   Reason for Exam (SYMPTOM  OR DIAGNOSIS REQUIRED)    Answer:   screening mammogram    Order Specific Question:   Is the patient pregnant?    Answer:   No    Order Specific Question:   Preferred imaging location?    Answer:   [facility]   CBC with Differential/Platelet   Comp Met (CMET)   Lipid panel    Vitamin D (25 hydroxy)   Lipase   Amylase   Ambulatory referral to Neurology    Referral Priority:   Routine    Referral Type:   Consultation    Referral Reason:   Specialty Services Required    Requested Specialty:   Neurology    Number of Visits Requested:   1   Ambulatory referral to Gastroenterology    Referral Priority:   Routine    Referral Type:   Consultation    Referral Reason:   Specialty Services Required    Referred to Provider:   Gatha Mayer, MD    Number of Visits Requested:   1   Ambulatory referral to Obstetrics / Gynecology    Referral Priority:   Routine    Referral Type:  Consultation    Referral Reason:   Specialty Services Required    Requested Specialty:   Obstetrics and Gynecology    Number of Visits Requested:   1    Requested Prescriptions   Signed Prescriptions Disp Refills   escitalopram (LEXAPRO) 10 MG tablet 90 tablet 3    Sig: Take 1 tablet (10 mg total) by mouth daily.

## 2021-07-02 ENCOUNTER — Encounter (HOSPITAL_BASED_OUTPATIENT_CLINIC_OR_DEPARTMENT_OTHER): Payer: Self-pay

## 2021-07-02 ENCOUNTER — Ambulatory Visit (HOSPITAL_BASED_OUTPATIENT_CLINIC_OR_DEPARTMENT_OTHER)
Admission: RE | Admit: 2021-07-02 | Discharge: 2021-07-02 | Disposition: A | Discharge status: Home / Self Care | Payer: 59 | Source: Ambulatory Visit | Attending: Family | Admitting: Family

## 2021-07-02 DIAGNOSIS — Z1231 Encounter for screening mammogram for malignant neoplasm of breast: Secondary | ICD-10-CM | POA: Insufficient documentation

## 2021-07-04 ENCOUNTER — Other Ambulatory Visit: Payer: Self-pay | Admitting: Family

## 2021-07-04 DIAGNOSIS — R928 Other abnormal and inconclusive findings on diagnostic imaging of breast: Secondary | ICD-10-CM

## 2021-07-06 ENCOUNTER — Encounter: Payer: Self-pay | Admitting: Family

## 2021-07-06 DIAGNOSIS — N644 Mastodynia: Secondary | ICD-10-CM

## 2021-07-09 ENCOUNTER — Other Ambulatory Visit: Payer: Self-pay | Admitting: Family

## 2021-07-09 DIAGNOSIS — N644 Mastodynia: Secondary | ICD-10-CM

## 2021-07-19 NOTE — Telephone Encounter (Signed)
Pt advised and acknowledged understanding

## 2021-07-19 NOTE — Telephone Encounter (Signed)
Pt called to check on status of secondary breast screenings. Pt would like a call back to go over status and anything that needs to be done (if any) on her end.

## 2021-07-25 NOTE — Telephone Encounter (Signed)
Pt called stating that the orders sent over to the imaging center did not include the bilateral testing w/ breast ultrasounds that were included to her email to Vernona Rieger. Pt has an appt scheduled with the imaging center on 6.8 @ 11a but only for the right breast. Pt would like to have orders updated and resent so that the new imaging can be done.

## 2021-08-01 ENCOUNTER — Other Ambulatory Visit: Payer: Self-pay | Admitting: *Deleted

## 2021-08-02 LAB — HM MAMMOGRAPHY

## 2021-08-03 ENCOUNTER — Encounter: Payer: Self-pay | Admitting: Family

## 2021-09-06 ENCOUNTER — Encounter: Payer: 59 | Admitting: Obstetrics & Gynecology

## 2021-09-13 ENCOUNTER — Encounter: Payer: 59 | Admitting: Obstetrics & Gynecology

## 2021-09-13 ENCOUNTER — Institutional Professional Consult (permissible substitution): Payer: 59 | Admitting: Neurology

## 2021-09-13 ENCOUNTER — Telehealth: Payer: Self-pay | Admitting: General Practice

## 2021-09-13 NOTE — Telephone Encounter (Signed)
Patient left a message with after hours this morning wanting to reschedule her appointment for that was scheduled at 0815 this morning.  Called pt and left message to contact our office to reschedule.

## 2021-09-14 ENCOUNTER — Encounter: Payer: Self-pay | Admitting: Family

## 2021-10-18 ENCOUNTER — Institutional Professional Consult (permissible substitution): Payer: 59 | Admitting: Neurology

## 2021-10-31 ENCOUNTER — Other Ambulatory Visit: Payer: Self-pay | Admitting: Nurse Practitioner

## 2021-10-31 ENCOUNTER — Encounter: Payer: Self-pay | Admitting: Nurse Practitioner

## 2021-10-31 ENCOUNTER — Other Ambulatory Visit: Payer: Self-pay | Admitting: Family

## 2021-10-31 DIAGNOSIS — S32010A Wedge compression fracture of first lumbar vertebra, initial encounter for closed fracture: Secondary | ICD-10-CM

## 2021-10-31 DIAGNOSIS — S22080A Wedge compression fracture of T11-T12 vertebra, initial encounter for closed fracture: Secondary | ICD-10-CM

## 2021-10-31 MED ORDER — CYCLOBENZAPRINE HCL 10 MG PO TABS: 10.0000 mg | ORAL_TABLET | Freq: Three times a day (TID) | ORAL | 0 refills | Status: DC | PRN

## 2021-10-31 NOTE — Telephone Encounter (Signed)
Roberta Sims, Pt is requesting TOC. Please advise.   She has also requested a refill on cyclobenzaprine prescribed by Rodman Pickle, DNP. Lauren is out of the office and would not refill not being the PCP. Pt was advised to contact you to f/u on RX request.

## 2021-11-01 ENCOUNTER — Telehealth: Payer: Self-pay | Admitting: Family

## 2021-11-01 DIAGNOSIS — S32010A Wedge compression fracture of first lumbar vertebra, initial encounter for closed fracture: Secondary | ICD-10-CM

## 2021-11-01 DIAGNOSIS — S22080A Wedge compression fracture of T11-T12 vertebra, initial encounter for closed fracture: Secondary | ICD-10-CM

## 2021-11-01 MED ORDER — CYCLOBENZAPRINE HCL 10 MG PO TABS: 10.0000 mg | ORAL_TABLET | Freq: Three times a day (TID) | ORAL | 0 refills | Status: DC | PRN

## 2021-11-01 NOTE — Telephone Encounter (Signed)
I have called the pharmacy and gave them the verbal since it kept going in as print. Rx was called in and pt notified.

## 2021-11-01 NOTE — Telephone Encounter (Signed)
Pt states pharmacy has not received flexeril and she is hoping this can be resent as soon as possible.   [facility] [address] [place], [place] - [address] AT [address]   [address] [place] [address]

## 2021-11-06 ENCOUNTER — Ambulatory Visit (INDEPENDENT_AMBULATORY_CARE_PROVIDER_SITE_OTHER): Payer: Commercial Managed Care - HMO | Admitting: Family Medicine

## 2021-11-06 ENCOUNTER — Ambulatory Visit: Payer: Self-pay

## 2021-11-06 ENCOUNTER — Encounter: Payer: Self-pay | Admitting: Family Medicine

## 2021-11-06 ENCOUNTER — Ambulatory Visit (HOSPITAL_BASED_OUTPATIENT_CLINIC_OR_DEPARTMENT_OTHER)
Admission: RE | Admit: 2021-11-06 | Discharge: 2021-11-06 | Disposition: A | Discharge status: Home / Self Care | Payer: Commercial Managed Care - HMO | Source: Ambulatory Visit | Attending: Family Medicine | Admitting: Family Medicine

## 2021-11-06 VITALS — Ht 69.0 in | Wt 151.0 lb

## 2021-11-06 DIAGNOSIS — S329XXA Fracture of unspecified parts of lumbosacral spine and pelvis, initial encounter for closed fracture: Secondary | ICD-10-CM | POA: Insufficient documentation

## 2021-11-06 DIAGNOSIS — M25552 Pain in left hip: Secondary | ICD-10-CM

## 2021-11-06 DIAGNOSIS — S76012A Strain of muscle, fascia and tendon of left hip, initial encounter: Secondary | ICD-10-CM | POA: Diagnosis not present

## 2021-11-06 MED ORDER — HYDROCODONE-ACETAMINOPHEN 5-325 MG PO TABS: 1.0000 | ORAL_TABLET | Freq: Three times a day (TID) | ORAL | 0 refills | Status: DC | PRN

## 2021-11-06 NOTE — Progress Notes (Signed)
  Roberta Sims - 51 y.o. female MRN [id number]  Date of birth: 1970-06-11  SUBJECTIVE:  Including CC & ROS.  No chief complaint on file.   Roberta Sims is a 51 y.o. female that is presenting with acute left hip pain.  She recently had an injury about 12 days ago.  She is having severe pain with weightbearing in the left inguinal crease.  No history of similar pain.   Review of Systems See HPI   HISTORY: Past Medical, Surgical, Social, and Family History Reviewed & Updated per EMR.   Pertinent Historical Findings include:  Past Medical History:  Diagnosis Date   GERD with stricture 03/04/2019   Hx of sessile serrated colonic polyp 06/28/2013   Scoliosis     Past Surgical History:  Procedure Laterality Date   COLONOSCOPY     ESOPHAGOGASTRODUODENOSCOPY     harrington rod--scoliosis       PHYSICAL EXAM:  VS: Ht 5\' 9"  (1.753 m)   Wt 151 lb (68.5 kg)   BMI 22.30 kg/m  Physical Exam Gen: NAD, alert, cooperative with exam, well-appearing MSK:  Neurovascularly intact    Limited ultrasound: left hip:  No hip joint effusion. There is increased hyperemia of the hip adductors. No discernible complete tear of the muscle in the hip adductor or flexors  Summary: findings consistent with strain  Ultrasound and interpretation by , MD    ASSESSMENT & PLAN:   Hip strain, left, initial encounter Acutely occurring.  Symptoms seem most consistent with a hip adductor strain.  Independent review of the left pelvis and hip x-ray shows no acute fracture.  Does show mild arthritis.  She may have a component of underlying labral irritation.  There is concern given her bone density of a nondisplaced fracture. -Counseled on home exercise therapy and supportive care. -X-ray. -Crutches. -Counseled on compression. -Follow-up in 1 week.  If pain is still severe will consider further imaging.

## 2021-11-06 NOTE — Assessment & Plan Note (Signed)
Acutely occurring.  Symptoms seem most consistent with a hip adductor strain.  Independent review of the left pelvis and hip x-ray shows no acute fracture.  Does show mild arthritis.  She may have a component of underlying labral irritation.  There is concern given her bone density of a nondisplaced fracture. -Counseled on home exercise therapy and supportive care. -X-ray. -Crutches. -Counseled on compression. -Follow-up in 1 week.  If pain is still severe will consider further imaging.

## 2021-11-07 ENCOUNTER — Ambulatory Visit: Payer: 59 | Admitting: Family Medicine

## 2021-11-07 ENCOUNTER — Encounter: Payer: Self-pay | Admitting: Neurology

## 2021-11-07 ENCOUNTER — Telehealth: Payer: Self-pay | Admitting: Family

## 2021-11-07 ENCOUNTER — Ambulatory Visit (INDEPENDENT_AMBULATORY_CARE_PROVIDER_SITE_OTHER): Payer: Commercial Managed Care - HMO | Admitting: Neurology

## 2021-11-07 VITALS — BP 136/92 | HR 91 | Ht 69.0 in | Wt 154.5 lb

## 2021-11-07 DIAGNOSIS — G478 Other sleep disorders: Secondary | ICD-10-CM | POA: Diagnosis not present

## 2021-11-07 DIAGNOSIS — R351 Nocturia: Secondary | ICD-10-CM | POA: Diagnosis not present

## 2021-11-07 DIAGNOSIS — R0683 Snoring: Secondary | ICD-10-CM | POA: Diagnosis not present

## 2021-11-07 DIAGNOSIS — R0681 Apnea, not elsewhere classified: Secondary | ICD-10-CM

## 2021-11-07 DIAGNOSIS — M4134 Thoracogenic scoliosis, thoracic region: Secondary | ICD-10-CM | POA: Diagnosis not present

## 2021-11-07 DIAGNOSIS — K219 Gastro-esophageal reflux disease without esophagitis: Secondary | ICD-10-CM

## 2021-11-07 DIAGNOSIS — G2581 Restless legs syndrome: Secondary | ICD-10-CM
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
## 2021-11-07 NOTE — Telephone Encounter (Signed)
Please give her the phone number below. She will need to get her new PCP to order follow up mammogram. I had on my reminder list that it is due in December 2023.   If you can't reach her, we could also mail letter.

## 2021-11-07 NOTE — Telephone Encounter (Signed)
-----   Message from Olive Bass, FNP sent at 08/03/2021  1:57 PM EDT ----- Needs diagnostic right mammogram with magnification views; Will need to be done at [facility]; [phone]

## 2021-11-07 NOTE — Patient Instructions (Signed)
Living With Sleep Apnea Sleep apnea is a condition in which breathing pauses or becomes shallow during sleep. Sleep apnea is most commonly caused by a collapsed or blocked airway. People with sleep apnea usually snore loudly. They may have times when they gasp and stop breathing for 10 seconds or more during sleep. This may happen many times during the night. The breaks in breathing also interrupt the deep sleep that you need to feel rested. Even if you do not completely wake up from the gaps in breathing, your sleep may not be restful and you feel tired during the day. You may also have a headache in the morning and low energy during the day, and you may feel anxious or depressed. How can sleep apnea affect me? Sleep apnea increases your chances of extreme tiredness during the day (daytime fatigue). It can also increase your risk for health conditions, such as: Heart attack. Stroke. Obesity. Type 2 diabetes. Heart failure. Irregular heartbeat. High blood pressure. If you have daytime fatigue as a result of sleep apnea, you may be more likely to: Perform poorly at school or work. Fall asleep while driving. Have difficulty with attention. Develop depression or anxiety. Have sexual dysfunction. What actions can I take to manage sleep apnea? Sleep apnea treatment  If you were given a device to open your airway while you sleep, use it only as told by your health care provider. You may be given: An oral appliance. This is a custom-made mouthpiece that shifts your lower jaw forward. A continuous positive airway pressure (CPAP) device. This device blows air through a mask when you breathe out (exhale). A nasal expiratory positive airway pressure (EPAP) device. This device has valves that you put into each nostril. A bi-level positive airway pressure (BIPAP) device. This device blows air through a mask when you breathe in (inhale) and breathe out (exhale). You may need surgery if other treatments  do not work for you. Sleep habits Go to sleep and wake up at the same time every day. This helps set your internal clock (circadian rhythm) for sleeping. If you stay up later than usual, such as on weekends, try to get up in the morning within 2 hours of your normal wake time. Try to get at least 7-9 hours of sleep each night. Stop using a computer, tablet, and mobile phone a few hours before bedtime. Do not take long naps during the day. If you nap, limit it to 30 minutes. Have a relaxing bedtime routine. Reading or listening to music may relax you and help you sleep. Use your bedroom only for sleep. Keep your television and computer out of your bedroom. Keep your bedroom cool, dark, and quiet. Use a supportive mattress and pillows. Follow your health care provider's instructions for other changes to sleep habits. Nutrition Do not eat heavy meals in the evening. Do not have caffeine in the later part of the day. The effects of caffeine can last for more than 5 hours. Follow your health care provider's or dietitian's instructions for any diet changes. Lifestyle     Do not drink alcohol before bedtime. Alcohol can cause you to fall asleep at first, but then it can cause you to wake up in the middle of the night and have trouble getting back to sleep. Do not use any products that contain nicotine or tobacco. These products include cigarettes, chewing tobacco, and vaping devices, such as e-cigarettes. If you need help quitting, ask your health care provider. Medicines Take   over-the-counter and prescription medicines only as told by your health care provider. Do not use over-the-counter sleep medicine. You can become dependent on this medicine, and it can make sleep apnea worse. Do not use medicines, such as sedatives and narcotics, unless told by your health care provider. Activity Exercise on most days, but avoid exercising in the evening. Exercising near bedtime can interfere with  sleeping. If possible, spend time outside every day. Natural light helps regulate your circadian rhythm. General information Lose weight if you need to, and maintain a healthy weight. Keep all follow-up visits. This is important. If you are having surgery, make sure to tell your health care provider that you have sleep apnea. You may need to bring your device with you. Where to find more information Learn more about sleep apnea and daytime fatigue from: American Sleep Association: sleepassociation.org National Sleep Foundation: sleepfoundation.org National Heart, Lung, and Blood Institute: nhlbi.nih.gov Summary Sleep apnea is a condition in which breathing pauses or becomes shallow during sleep. Sleep apnea can cause daytime fatigue and other serious health conditions. You may need to wear a device while sleeping to help keep your airway open. If you are having surgery, make sure to tell your health care provider that you have sleep apnea. You may need to bring your device with you. Making changes to sleep habits, diet, lifestyle, and activity can help you manage sleep apnea. This information is not intended to replace advice given to you by your health care provider. Make sure you discuss any questions you have with your health care provider. Document Revised: 09/20/2020 Document Reviewed: 01/21/2020 Elsevier Patient Education  2023 Elsevier Inc. Quality Sleep Information, Adult Quality sleep is important for your mental and physical health. It also improves your quality of life. Quality sleep means you: Are asleep for most of the time you are in bed. Fall asleep within 30 minutes. Wake up no more than once a night. Are awake for no longer than 20 minutes if you do wake up during the night. Most adults need 7-8 hours of quality sleep each night. How can poor sleep affect me? If you do not get enough quality sleep, you may have: Mood swings. Daytime sleepiness. Decreased alertness,  reaction time, and concentration. Sleep disorders, such as insomnia and sleep apnea. Difficulty with: Solving problems. Coping with stress. Paying attention. These issues may affect your performance and productivity at work, school, and home. Lack of sleep may also put you at higher risk for accidents, suicide, and risky behaviors. If you do not get quality sleep, you may also be at higher risk for several health problems, including: Infections. Type 2 diabetes. Heart disease. High blood pressure. Obesity. Worsening of long-term conditions, like arthritis, kidney disease, depression, Parkinson's disease, and epilepsy. What actions can I take to get more quality sleep? Sleep schedule and routine Stick to a sleep schedule. Go to sleep and wake up at about the same time each day. Do not try to sleep less on weekdays and make up for lost sleep on weekends. This does not work. Limit naps during the day to 30 minutes or less. Do not take naps in the late afternoon. Make time to relax before bed. Reading, listening to music, or taking a hot bath promotes quality sleep. Make your bedroom a place that promotes quality sleep. Keep your bedroom dark, quiet, and at a comfortable room temperature. Make sure your bed is comfortable. Avoid using electronic devices that give off bright blue light for 30 minutes   before bedtime. Your brain perceives bright blue light as sunlight. This includes television, phones, and computers. If you are lying awake in bed for longer than 20 minutes, get up and do a relaxing activity until you feel sleepy. Lifestyle     Try to get at least 30 minutes of exercise on most days. Do not exercise 2-3 hours before going to bed. Do not use any products that contain nicotine or tobacco. These products include cigarettes, chewing tobacco, and vaping devices, such as e-cigarettes. If you need help quitting, ask your health care provider. Do not drink caffeinated beverages for at  least 8 hours before going to bed. Coffee, tea, and some sodas contain caffeine. Do not drink alcohol or eat large meals close to bedtime. Try to get at least 30 minutes of sunlight every day. Morning sunlight is best. Medical concerns Work with your health care provider to treat medical conditions that may affect sleeping, such as: Nasal obstruction. Snoring. Sleep apnea and other sleep disorders. Talk to your health care provider if you think any of your prescription medicines may cause you to have difficulty falling or staying asleep. If you have sleep problems, talk with a sleep consultant. If you think you have a sleep disorder, talk with your health care provider about getting evaluated by a specialist. Where to find more information Sleep Foundation: sleepfoundation.org American Academy of Sleep Medicine: aasm.org Centers for Disease Control and Prevention (CDC): cdc.gov Contact a health care provider if: You have trouble getting to sleep or staying asleep. You often wake up very early in the morning and cannot get back to sleep. You have daytime sleepiness. You have daytime sleep attacks of suddenly falling asleep and sudden muscle weakness (narcolepsy). You have a tingling sensation in your legs with a strong urge to move your legs (restless legs syndrome). You stop breathing briefly during sleep (sleep apnea). You think you have a sleep disorder or are taking a medicine that is affecting your quality of sleep. Summary Most adults need 7-8 hours of quality sleep each night. Getting enough quality sleep is important for your mental and physical health. Make your bedroom a place that promotes quality sleep, and avoid things that may cause you to have poor sleep, such as alcohol, caffeine, smoking, or large meals. Talk to your health care provider if you have trouble falling asleep or staying asleep. This information is not intended to replace advice given to you by your health care  provider. Make sure you discuss any questions you have with your health care provider. Document Revised: 06/06/2021 Document Reviewed: 06/06/2021 Elsevier Patient Education  2023 Elsevier Inc.  

## 2021-11-09 NOTE — Telephone Encounter (Signed)
I have called the pt and relayed the message from the provider. She answered and stated that she loved [facility], however the new pcp office is more convenient for her. Pt also stated wanted to thank [facility] for the reminder.

## 2021-11-12 ENCOUNTER — Encounter: Payer: Commercial Managed Care - HMO | Admitting: Nurse Practitioner

## 2021-11-13 ENCOUNTER — Encounter: Payer: Self-pay | Admitting: Family Medicine

## 2021-11-15 ENCOUNTER — Encounter: Payer: Self-pay | Admitting: Family Medicine

## 2021-11-15 ENCOUNTER — Ambulatory Visit: Payer: Commercial Managed Care - HMO | Admitting: Family Medicine

## 2021-11-15 VITALS — BP 127/88 | Ht 69.0 in | Wt 154.0 lb

## 2021-11-15 DIAGNOSIS — S32512G Fracture of superior rim of left pubis, subsequent encounter for fracture with delayed healing: Secondary | ICD-10-CM

## 2021-11-15 MED ORDER — METHYLPREDNISOLONE ACETATE 40 MG/ML IJ SUSP
40.0000 mg | Freq: Once | INTRAMUSCULAR | Status: AC
  Administered 2021-11-15: 40 mg via INTRAMUSCULAR

## 2021-11-15 MED ORDER — HYDROCODONE-ACETAMINOPHEN 5-325 MG PO TABS: 1.0000 | ORAL_TABLET | Freq: Three times a day (TID) | ORAL | 0 refills | Status: DC | PRN

## 2021-11-15 NOTE — Assessment & Plan Note (Signed)
Acutely worsening.  Having severe pain at all times.  She had a fall 3 weeks ago onto the pelvis.  X-rays from 9/12 were unrevealing.  She does have a history of osteoporosis and concern for fracture given the degree of pain and limited mobility. -Counseled on home exercise therapy and supportive care. -Continue crutches. -IM depo -Norco. -MRI of the pelvis to evaluate for pelvic fracture and for presurgical planning.

## 2021-11-15 NOTE — Patient Instructions (Signed)
Good to see you Please use the pain medicine as needed We'll get the MRI at [facility] imaging   Please send me a message in [place] with any questions or updates.  We'll setup a virtual visit once the MRi is resutled.   --Dr. Raeford Razor

## 2021-11-15 NOTE — Progress Notes (Signed)
  Roberta Sims - 51 y.o. female MRN [id number]  Date of birth: 1971/02/07  SUBJECTIVE:  Including CC & ROS.  No chief complaint on file.   Roberta Sims is a 51 y.o. female that is presenting with acute worsening of her hip and pelvis pain.  She had a fall onto her pelvis about 3 weeks ago.  Since that time she has had severe anterior pelvic pain.  No improvement with using the crutches.  Has limited motion of the left hip.  Does have a history of osteoporosis as well as significant back surgeries.  Has a history of partial hysterectomy from about 7 years ago.  Has been using hydrocodone with limited relief.    Review of Systems See HPI   HISTORY: Past Medical, Surgical, Social, and Family History Reviewed & Updated per EMR.   Pertinent Historical Findings include:  Past Medical History:  Diagnosis Date   GERD with stricture 03/04/2019   Hx of sessile serrated colonic polyp 06/28/2013   Scoliosis     Past Surgical History:  Procedure Laterality Date   COLONOSCOPY     ESOPHAGOGASTRODUODENOSCOPY     harrington rod--scoliosis     PARTIAL HYSTERECTOMY       PHYSICAL EXAM:  VS: BP 127/88 (BP Location: Right Arm, Patient Position: Sitting)   Ht 5\' 9"  (1.753 m)   Wt 154 lb (69.9 kg)   BMI 22.74 kg/m  Physical Exam Gen: NAD, alert, cooperative with exam, well-appearing MSK:  Pelvis/left hip: Unable to bear weight on the left hip. Pain with compression of the pelvis. Limited flexion and extension of the hip. Weakness with resistance of the left hip. Neurovascularly intact       ASSESSMENT & PLAN:   Pelvic fracture (HCC) Acutely worsening.  Having severe pain at all times.  She had a fall 3 weeks ago onto the pelvis.  X-rays from 9/12 were unrevealing.  She does have a history of osteoporosis and concern for fracture given the degree of pain and limited mobility. -Counseled on home exercise therapy and supportive care. -Continue crutches. -IM depo -Norco. -MRI of the  pelvis to evaluate for pelvic fracture and for presurgical planning.

## 2021-11-19 ENCOUNTER — Ambulatory Visit
Admission: RE | Admit: 2021-11-19 | Discharge: 2021-11-19 | Disposition: A | Discharge status: Home / Self Care | Payer: Commercial Managed Care - HMO | Source: Ambulatory Visit | Attending: Family Medicine | Admitting: Family Medicine

## 2021-11-19 DIAGNOSIS — S32512G Fracture of superior rim of left pubis, subsequent encounter for fracture with delayed healing: Secondary | ICD-10-CM

## 2021-11-20 ENCOUNTER — Telehealth (INDEPENDENT_AMBULATORY_CARE_PROVIDER_SITE_OTHER): Payer: Commercial Managed Care - HMO | Admitting: Family Medicine

## 2021-11-20 ENCOUNTER — Encounter: Payer: Self-pay | Admitting: Family Medicine

## 2021-11-20 VITALS — Ht 69.0 in | Wt 154.0 lb

## 2021-11-20 DIAGNOSIS — S32512G Fracture of superior rim of left pubis, subsequent encounter for fracture with delayed healing: Secondary | ICD-10-CM | POA: Diagnosis not present

## 2021-11-20 DIAGNOSIS — M84351A Stress fracture, right femur, initial encounter for fracture: Secondary | ICD-10-CM | POA: Diagnosis not present

## 2021-11-20 DIAGNOSIS — S22080A Wedge compression fracture of T11-T12 vertebra, initial encounter for closed fracture: Secondary | ICD-10-CM | POA: Diagnosis not present

## 2021-11-20 DIAGNOSIS — S32010A Wedge compression fracture of first lumbar vertebra, initial encounter for closed fracture: Secondary | ICD-10-CM

## 2021-11-20 DIAGNOSIS — S32130A Nondisplaced Zone III fracture of sacrum, initial encounter for closed fracture: Secondary | ICD-10-CM | POA: Insufficient documentation

## 2021-11-20 DIAGNOSIS — M8000XA Age-related osteoporosis with current pathological fracture, unspecified site, initial encounter for fracture: Secondary | ICD-10-CM

## 2021-11-20 MED ORDER — CYCLOBENZAPRINE HCL 10 MG PO TABS: 10.0000 mg | ORAL_TABLET | Freq: Three times a day (TID) | ORAL | 1 refills | Status: DC | PRN

## 2021-11-20 NOTE — Assessment & Plan Note (Signed)
Acutely occurring after her fall.  Observed on her MRI of the pelvis.  Not having significant pain in the sacrum currently. -Counseled on home exercise therapy and supportive care. -Pursue compression.

## 2021-11-20 NOTE — Assessment & Plan Note (Signed)
Acute on chronic in nature.  She has a history of compression fractures at multiple levels as well as 3 current fractures from a low trauma fall.  Previous bone density was showing a T score of -2.6. -Would consider Evenity given the amount of fractures that she has experienced. -Counseled on vitamin D3 and K2.

## 2021-11-20 NOTE — Progress Notes (Addendum)
Virtual Visit via Video Note  I connected with Roberta Sims on 11/27/21 at  1:10 PM EDT by a video enabled telemedicine application and verified that I am speaking with the correct person using two identifiers.  Location: Patient: home Provider: office   I discussed the limitations of evaluation and management by telemedicine and the availability of in person appointments. The patient expressed understanding and agreed to proceed.  History of Present Illness:  Ms. Roberta Sims is a 50 year old female that is following up for her MRI of the pelvis.  This was demonstrating a nondisplaced fracture of the left pubic bone as well as a contusion centered at S4 as well as a probable stress fracture at the base of the right femoral neck.   Observations/Objective:   Assessment and Plan:  Osteoporosis: Acute on chronic in nature.  She has a history of compression fractures at multiple levels as well as 3 current fractures from a low trauma fall.  Previous bone density was showing a T score of -2.6. -Would consider Evenity given the amount of fractures that she has experienced. -Counseled on vitamin D3 and K2.  Left pubic bone fracture: Acutely occurring after her low trauma fall.  MRI was confirming the fracture.  All of her pain is on the left side. -Counseled on home exercise therapy and supportive care. -Pursue back brace to help alleviate pain and improve function.  Sacral fracture: Acutely occurring after her fall.  Observed on her MRI of the pelvis.  Not having significant pain in the sacrum currently. -Counseled on home exercise therapy and supportive care. -Pursue compression.  Stress fracture of right femur: Found incidentally on the MRI of the pelvis.  Unclear if this is associated with her placing her weight on the right side to compensate for her current left-sided pain.  Not currently having pain in the right hip -Counseled on home exercise therapy and supportive care. -Referral to  orthopedic surgeon.  Follow Up Instructions:    I discussed the assessment and treatment plan with the patient. The patient was provided an opportunity to ask questions and all were answered. The patient agreed with the plan and demonstrated an understanding of the instructions.   The patient was advised to call back or seek an in-person evaluation if the symptoms worsen or if the condition fails to improve as anticipated.    Clearance Coots, MD

## 2021-11-20 NOTE — Assessment & Plan Note (Addendum)
Acutely occurring after her low trauma fall.  MRI was confirming the fracture.  All of her pain is on the left side. -Counseled on home exercise therapy and supportive care. -Pursue back brace to help alleviate pain and improve function.

## 2021-11-20 NOTE — Addendum Note (Signed)
Addended by: Rosemarie Ax on: 11/20/2021 02:44 PM   Modules accepted: Orders

## 2021-11-20 NOTE — Assessment & Plan Note (Signed)
Found incidentally on the MRI of the pelvis.  Unclear if this is associated with her placing her weight on the right side to compensate for her current left-sided pain.  Not currently having pain in the right hip -Counseled on home exercise therapy and supportive care. -Referral to orthopedic surgeon.

## 2021-11-26 ENCOUNTER — Ambulatory Visit (INDEPENDENT_AMBULATORY_CARE_PROVIDER_SITE_OTHER): Payer: Commercial Managed Care - HMO | Admitting: Nurse Practitioner

## 2021-11-26 ENCOUNTER — Encounter: Payer: Self-pay | Admitting: Nurse Practitioner

## 2021-11-26 VITALS — BP 133/82 | HR 92 | Temp 96.0°F | Wt 150.0 lb

## 2021-11-26 DIAGNOSIS — N951 Menopausal and female climacteric states: Secondary | ICD-10-CM

## 2021-11-26 DIAGNOSIS — E785 Hyperlipidemia, unspecified: Secondary | ICD-10-CM | POA: Diagnosis not present

## 2021-11-26 DIAGNOSIS — S32130A Nondisplaced Zone III fracture of sacrum, initial encounter for closed fracture: Secondary | ICD-10-CM

## 2021-11-26 DIAGNOSIS — M84351D Stress fracture, right femur, subsequent encounter for fracture with routine healing: Secondary | ICD-10-CM | POA: Diagnosis not present

## 2021-11-26 DIAGNOSIS — R5383 Other fatigue: Secondary | ICD-10-CM

## 2021-11-26 DIAGNOSIS — G4700 Insomnia, unspecified: Secondary | ICD-10-CM

## 2021-11-26 DIAGNOSIS — E559 Vitamin D deficiency, unspecified: Secondary | ICD-10-CM | POA: Diagnosis not present

## 2021-11-26 DIAGNOSIS — Z803 Family history of malignant neoplasm of breast: Secondary | ICD-10-CM

## 2021-11-26 DIAGNOSIS — D509 Iron deficiency anemia, unspecified: Secondary | ICD-10-CM

## 2021-11-26 DIAGNOSIS — Z8739 Personal history of other diseases of the musculoskeletal system and connective tissue: Secondary | ICD-10-CM | POA: Diagnosis not present

## 2021-11-26 DIAGNOSIS — S32512G Fracture of superior rim of left pubis, subsequent encounter for fracture with delayed healing: Secondary | ICD-10-CM

## 2021-11-26 DIAGNOSIS — M8000XD Age-related osteoporosis with current pathological fracture, unspecified site, subsequent encounter for fracture with routine healing: Secondary | ICD-10-CM

## 2021-11-26 DIAGNOSIS — F419 Anxiety disorder, unspecified: Secondary | ICD-10-CM

## 2021-11-26 MED ORDER — ZOLPIDEM TARTRATE 5 MG PO TABS: 5.0000 mg | ORAL_TABLET | Freq: Every evening | ORAL | 0 refills | Status: DC | PRN

## 2021-11-26 MED ORDER — HYDROCODONE-ACETAMINOPHEN 5-325 MG PO TABS: 1.0000 | ORAL_TABLET | Freq: Three times a day (TID) | ORAL | 0 refills | Status: DC | PRN

## 2021-11-26 NOTE — Progress Notes (Signed)
New Patient Visit  BP 133/82   Pulse 92   Temp (!) 96 F (35.6 C) (Temporal)   Wt 150 lb (68 kg)   SpO2 98%   BMI 22.15 kg/m    Subjective:    Patient ID: Roberta Sims, female    DOB: 1970/07/29, 51 y.o.   MRN: [id number]  CC: Chief Complaint  Patient presents with   Transitions Of Care    TOC. Est care. Overall health assessment. Pt requesting med review/refill     HPI: Roberta Sims is a 51 y.o. female presents to transfer care to a new provider.  Introduced to Designer, jewellery role and practice setting.  All questions answered.  Discussed provider/patient relationship and expectations.  She states that she had a fall on 10/26/21. She endorsed left groin pain. She had x-rays which were negative for fracture, however MRI showed left pubic fracture, sacral fracture, and right femoral neck fracture. She also had a dexa scan which showed osteoporosis with a T score of -2.6. She is working with sports medicine and is being fitted for a compression back brace. She was also referred to orthopedics. She has been taking supplements: magnesium citrate, milk thistle, and k2 supplement. She was also taking a prescription vitamin D supplement and is now going to go back to her OTC calcium and vitamin D supplement.   She has a history of hyperlipidemia. She has tried fenofibrate, however it caused her side effects. She was prescribed rosuvastatin, however she has not started this medication yet.   She has a history of partial hysterectomy due to uterine prolapse. She has been experiencing hot flashes for the past few years and is interested in having her hormones checked. She also has a family history of breast cancer and would like to be checked for BRCA gene.  She has a history of anxiety and insomnia. She is taking lexapro 47m daily and ambien 561mas needed at bedtime. She states her symptoms are well controlled. She denies SI/HI.      11/26/2021    4:39 PM 06/29/2021    9:09 AM 06/22/2021    11:34 AM 10/27/2020    3:15 PM 09/11/2018    3:51 PM  Depression screen PHQ 2/9  Decreased Interest 0 0 0 0 0  Down, Depressed, Hopeless 0 0 0 0 0  PHQ - 2 Score 0 0 0 0 0  Altered sleeping 1    0  Tired, decreased energy 1    0  Change in appetite 1    0  Feeling bad or failure about yourself  0    0  Trouble concentrating 0    0  Moving slowly or fidgety/restless 0    0  Suicidal thoughts 0    0  PHQ-9 Score 3    0  Difficult doing work/chores Very difficult          11/26/2021    4:39 PM 09/11/2018    3:51 PM 09/11/2018    3:50 PM  GAD 7 : Generalized Anxiety Score  Nervous, Anxious, on Edge 0 0 3  Control/stop worrying 0 0 3  Worry too much - different things 0 0 3  Trouble relaxing 0 0 3  Restless 0 0 3  Easily annoyed or irritable 1 0 3  Afraid - awful might happen 0 0 3  Total GAD 7 Score 1 0 21  Anxiety Difficulty Very difficult      Past Medical History:  Diagnosis Date  Anxiety    Compression fracture of thoracic spine, non-traumatic, sequela    Dysphagia    GERD with stricture 03/04/2019   Hx of sessile serrated colonic polyp 06/28/2013   Hyperlipidemia    Osteoporosis    Scoliosis    Uterine prolapse     Past Surgical History:  Procedure Laterality Date   COLONOSCOPY     ESOPHAGOGASTRODUODENOSCOPY     harrington rod--scoliosis     PARTIAL HYSTERECTOMY      Family History  Problem Relation Age of Onset   Pancreatic cancer Mother        metastasized to colon   Alcohol abuse Brother    Diabetes Brother    Cancer Maternal Aunt        breast   Colon cancer Maternal Grandmother      Social History   Tobacco Use   Smoking status: Never   Smokeless tobacco: Never  Vaping Use   Vaping Use: Never used  Substance Use Topics   Alcohol use: Yes    Comment: socially   Drug use: Never    Current Outpatient Medications on File Prior to Visit  Medication Sig Dispense Refill   Ascorbic Acid (VITAMIN C) 100 MG tablet Take 100 mg by mouth daily.      Cetirizine HCl (ZYRTEC PO) Take by mouth.     cyclobenzaprine (FLEXERIL) 10 MG tablet Take 1 tablet (10 mg total) by mouth 3 (three) times daily as needed for muscle spasms. 90 tablet 1   escitalopram (LEXAPRO) 10 MG tablet Take 1 tablet (10 mg total) by mouth daily. 90 tablet 3   omeprazole (PRILOSEC) 40 MG capsule TAKE ONE CAPSULE BY MOUTH ONE TIME DAILY BEFORE BREAKFAST 90 capsule 3   zinc gluconate 50 MG tablet Take 50 mg by mouth daily.     No current facility-administered medications on file prior to visit.     Review of Systems  Constitutional:  Positive for fatigue. Negative for fever.  HENT: Negative.    Respiratory: Negative.    Cardiovascular: Negative.   Gastrointestinal: Negative.   Genitourinary: Negative.   Musculoskeletal:  Positive for arthralgias and back pain.  Skin: Negative.   Neurological: Negative.   Psychiatric/Behavioral: Negative.        Objective:    BP 133/82   Pulse 92   Temp (!) 96 F (35.6 C) (Temporal)   Wt 150 lb (68 kg)   SpO2 98%   BMI 22.15 kg/m   Wt Readings from Last 3 Encounters:  11/26/21 150 lb (68 kg)  11/20/21 154 lb (69.9 kg)  11/15/21 154 lb (69.9 kg)    BP Readings from Last 3 Encounters:  11/26/21 133/82  11/15/21 127/88  11/07/21 (!) 136/92    Physical Exam Vitals and nursing note reviewed.  Constitutional:      General: She is not in acute distress.    Appearance: Normal appearance.  HENT:     Head: Normocephalic.  Eyes:     Conjunctiva/sclera: Conjunctivae normal.  Cardiovascular:     Rate and Rhythm: Normal rate and regular rhythm.     Pulses: Normal pulses.     Heart sounds: Normal heart sounds.  Pulmonary:     Effort: Pulmonary effort is normal.     Breath sounds: Normal breath sounds.  Abdominal:     Palpations: Abdomen is soft.     Tenderness: There is no abdominal tenderness.  Musculoskeletal:        General: Tenderness present.  Cervical back: Normal range of motion.     Comments:  Ambulating with crutches  Skin:    General: Skin is warm.  Neurological:     General: No focal deficit present.     Mental Status: She is alert and oriented to person, place, and time.  Psychiatric:        Mood and Affect: Mood normal.        Behavior: Behavior normal.        Thought Content: Thought content normal.        Judgment: Judgment normal.       Assessment & Plan:   Problem List Items Addressed This Visit       Musculoskeletal and Integument   Osteoporosis    T score is -2.6 on 12/14/2020.  Recommend with recent fractures that she consider treatment for osteoporosis.  She is talking about doing Evenity with her sports medicine provider as they do provide this injection there.  Encouraged her to talk about this and schedule this when able.  Also discussed continuing calcium and vitamin D, along with weightbearing exercises.      Pelvic fracture ([place])    Acute after fall on 10/26/2021.  She is currently working with sports medicine and will be seeing orthopedics.  We will refill her hydrocodone every 8 hours as needed.  PDMP reviewed.      Closed nondisplaced zone III fracture of sacrum (HCC) - Primary    Acute after fall on 10/26/2021.  She is currently following with sports medicine and has referral placed to orthopedics.  She has not yet heard from them, will place a referral as well.  She is hoping to get in with [facility] and Dr. Stann Mainland.  We will refill her hydrocodone every 8 hours as needed.  PDMP reviewed.      Relevant Orders   Ambulatory referral to Orthopedic Surgery   Stress fracture of right femur    Noted after fall on 10/26/2021.  She is not having any pain in the right hip.  She has a referral that is being worked on to orthopedics.  Reach out if she is unable to get an appointment.      Relevant Orders   Ambulatory referral to Orthopedic Surgery     Other   Anxiety    Chronic, stable.  Continue Lexapro 10 mg daily.  Denies SI/HI.  Follow-up in 6  months.      H/O scoliosis    She has a history of scoliosis that required surgery when she was younger.      Menopausal symptoms    She has been experiencing hot flashes.  She would like her hormone levels checked, will check estrogen, FSH, LH, testosterone.      Relevant Orders   FSH   LH   Testosterone   Estrogens, Total   Vitamin D deficiency    She has a history of vitamin D deficiency and is currently taking an over-the-counter supplement after finishing a course of prescription.  We will check vitamin D levels and adjust regimen based on results.      Relevant Orders   VITAMIN D 25 Hydroxy (Vit-D Deficiency, Fractures)   Hyperlipidemia    She has a history of elevated cholesterol.  She did not tolerate the fenofibrate.  She was prescribed rosuvastatin, however has not started this.  We will check lipid panel today and adjust regimen based on results.      Relevant Orders   Lipid panel  Insomnia    Chronic, stable.  We will continue Ambien 5 mg as needed at bedtime.  PDMP reviewed.  Discussed not taking this with the hydrocodone together.      Relevant Medications   zolpidem (AMBIEN) 5 MG tablet   Other Visit Diagnoses     Fatigue, unspecified type       Ongoing fatigue. Will check CMP, CBC, TSH, and vitamin B12.    Relevant Orders   CBC   Comprehensive metabolic panel   Vitamin N40   Iron, TIBC and Ferritin Panel (Completed)   TSH   Family history of breast cancer       After dicsussion about benefits and risks, will check BRCA panel per patient request.    Relevant Orders   BRCA Panel (BRCA1, BRCA2)        Follow up plan: Return in about 3 months (around 02/26/2022) for follow-up.

## 2021-11-26 NOTE — Patient Instructions (Signed)
It was great to see you!  We are checking your labs today and will let you know the results via mychart/phone.   Continue following with Dr. Raeford Razor and orthopedics.   Let's follow-up in 3 months, sooner if you have concerns.  If a referral was placed today, you will be contacted for an appointment. Please note that routine referrals can sometimes take up to 3-4 weeks to process. Please call our office if you haven't heard anything after this time frame.  Take care,  Vance Peper, NP

## 2021-11-27 ENCOUNTER — Ambulatory Visit (INDEPENDENT_AMBULATORY_CARE_PROVIDER_SITE_OTHER): Payer: Commercial Managed Care - HMO | Admitting: Orthopaedic Surgery

## 2021-11-27 DIAGNOSIS — S3282XA Multiple fractures of pelvis without disruption of pelvic ring, initial encounter for closed fracture: Secondary | ICD-10-CM

## 2021-11-27 DIAGNOSIS — G4700 Insomnia, unspecified: Secondary | ICD-10-CM | POA: Insufficient documentation

## 2021-11-27 LAB — CBC
HCT: 32.8 % — ABNORMAL LOW (ref 36.0–46.0)
Hemoglobin: 10.6 g/dL — ABNORMAL LOW (ref 12.0–15.0)
MCHC: 32.3 g/dL (ref 30.0–36.0)
MCV: 87.1 fl (ref 78.0–100.0)
Platelets: 325 10*3/uL (ref 150.0–400.0)
RBC: 3.77 Mil/uL — ABNORMAL LOW (ref 3.87–5.11)
RDW: 17.2 % — ABNORMAL HIGH (ref 11.5–15.5)
WBC: 7.4 10*3/uL (ref 4.0–10.5)

## 2021-11-27 LAB — FOLLICLE STIMULATING HORMONE: FSH: 105.5 m[IU]/mL

## 2021-11-27 LAB — LIPID PANEL
Cholesterol: 294 mg/dL — ABNORMAL HIGH (ref 0–200)
HDL: 51.5 mg/dL (ref >39.00)
NonHDL: 242.39
Total CHOL/HDL Ratio: 6
Triglycerides: 352 mg/dL — ABNORMAL HIGH (ref 0.0–149.0)
VLDL: 70.4 mg/dL — ABNORMAL HIGH (ref 0.0–40.0)

## 2021-11-27 LAB — COMPREHENSIVE METABOLIC PANEL
ALT: 14 U/L (ref 0–35)
AST: 17 U/L (ref 0–37)
Albumin: 4.3 g/dL (ref 3.5–5.2)
Alkaline Phosphatase: 84 U/L (ref 39–117)
BUN: 17 mg/dL (ref 6–23)
CO2: 26 mEq/L (ref 19–32)
Calcium: 9.5 mg/dL (ref 8.4–10.5)
Chloride: 101 mEq/L (ref 96–112)
Creatinine, Ser: 0.79 mg/dL (ref 0.40–1.20)
GFR: 85.41 mL/min (ref >60.00)
Glucose, Bld: 89 mg/dL (ref 70–99)
Potassium: 4.3 mEq/L (ref 3.5–5.1)
Sodium: 137 mEq/L (ref 135–145)
Total Bilirubin: 0.3 mg/dL (ref 0.2–1.2)
Total Protein: 6.9 g/dL (ref 6.0–8.3)

## 2021-11-27 LAB — LDL CHOLESTEROL, DIRECT: Direct LDL: 206 mg/dL

## 2021-11-27 LAB — VITAMIN B12: Vitamin B-12: 241 pg/mL (ref 211–911)

## 2021-11-27 LAB — VITAMIN D 25 HYDROXY (VIT D DEFICIENCY, FRACTURES): VITD: 25.33 ng/mL — ABNORMAL LOW (ref 30.00–100.00)

## 2021-11-27 LAB — TESTOSTERONE: Testosterone: 22.67 ng/dL (ref 15.00–40.00)

## 2021-11-27 LAB — LUTEINIZING HORMONE: LH: 51.97 m[IU]/mL

## 2021-11-27 LAB — TSH: TSH: 1.62 u[IU]/mL (ref 0.35–5.50)

## 2021-11-27 NOTE — Assessment & Plan Note (Signed)
Chronic, stable.  We will continue Ambien 5 mg as needed at bedtime.  PDMP reviewed.  Discussed not taking this with the hydrocodone together.

## 2021-11-27 NOTE — Progress Notes (Signed)
Office Visit Note   Patient: Roberta Sims           Date of Birth: Nov 09, 1970           MRN: [id number] Visit Date: 11/27/2021              Requested by: Roberta Scull, NP [address] [place],  [place] [address] PCP: Roberta Scull, NP   Assessment & Plan: Visit Diagnoses:  1. Multiple closed fractures of pelvis without disruption of pelvic ring, initial encounter (HCC)     Plan: Impression is multiple pelvic ring fractures without disruption of the ring as well as a intertrochanteric stress reaction.  Currently she is not symptomatic from the right intertrochanteric stress reaction.  She is doing well with protected weightbearing with crutches.  She can use these as long as she is symptomatic.  She does not need to follow-up with me for the right hip unless she develops symptoms at which point we would need repeat imaging.  She does not need to follow-up with me but she does have my card if she needs to get in touch with me.  Follow-Up Instructions: No follow-ups on file.   Orders:  No orders of the defined types were placed in this encounter.  No orders of the defined types were placed in this encounter.     Procedures: No procedures performed   Clinical Data: No additional findings.   Subjective: Chief Complaint  Patient presents with   Right Hip - Pain    HPI Roberta Sims is a 51 year old female referral from Dr. Jordan Sims for pelvic fractures as well as a incidental finding of right intertrochanteric stress fracture.  She reports pain all on the left side and no pain in the right hip.  She is self-employed.  Review of Systems  Constitutional: Negative.   HENT: Negative.    Eyes: Negative.   Respiratory: Negative.    Cardiovascular: Negative.   Endocrine: Negative.   Musculoskeletal: Negative.   Neurological: Negative.   Hematological: Negative.   Psychiatric/Behavioral: Negative.    All other systems reviewed and are  negative.    Objective: Vital Signs: There were no vitals taken for this visit.  Physical Exam Vitals and nursing note reviewed.  Constitutional:      Appearance: She is well-developed.  HENT:     Head: Atraumatic.     Nose: Nose normal.  Eyes:     Extraocular Movements: Extraocular movements intact.  Cardiovascular:     Pulses: Normal pulses.  Pulmonary:     Effort: Pulmonary effort is normal.  Abdominal:     Palpations: Abdomen is soft.  Musculoskeletal:     Cervical back: Neck supple.  Skin:    General: Skin is warm.     Capillary Refill: Capillary refill takes less than 2 seconds.  Neurological:     Mental Status: She is alert. Mental status is at baseline.  Psychiatric:        Behavior: Behavior normal.        Thought Content: Thought content normal.        Judgment: Judgment normal.     Ortho Exam Examination right hip shows painless range of motion.  No significant trochanteric tenderness.  She has tenderness to the pelvic brim. Specialty Comments:  No specialty comments available.  Imaging: No results found.   PMFS History: Patient Active Problem List   Diagnosis Date Noted   Insomnia 11/27/2021   Multiple closed pelvic fractures without disruption of  pelvic circle ([place]) 11/27/2021   Closed nondisplaced zone III fracture of sacrum ([place]) 11/20/2021   Stress fracture of right femur 11/20/2021   Non-restorative sleep 11/07/2021   Snoring 11/07/2021   Thoracogenic scoliosis of thoracic region 11/07/2021   GERD with apnea 11/07/2021   Nocturia 11/07/2021   Pelvic fracture ([place]) 11/06/2021   Osteoporosis 06/29/2021   Hyperlipidemia 06/29/2021   Acute bilateral thoracic back pain 03/07/2021   Compression fracture of T12 vertebra ([place]) 12/11/2020   Compression fracture of L1 lumbar vertebra ([place]) 12/11/2020   Estrogen deficiency 12/11/2020   Vitamin D deficiency 12/11/2020   History of thoracic spinal fusion 11/23/2020   Piriformis syndrome of left  side 11/14/2020   GERD with stricture 03/04/2019   Esophageal dysphagia 01/07/2019   Essential familial hyperlipidemia 04/24/2018   Hypertriglyceridemia, familial 04/24/2018   Anxiety 04/23/2018   H/O scoliosis 04/23/2018   Impaired fasting glucose 04/23/2018   Exposure to hepatitis C 03/25/2018   Menopausal symptoms 03/25/2018   Pain of left side of body 03/25/2018   Hx of colonic polyps 06/28/2013   Past Medical History:  Diagnosis Date   Anxiety    Compression fracture of thoracic spine, non-traumatic, sequela    Dysphagia    GERD with stricture 03/04/2019   Hx of sessile serrated colonic polyp 06/28/2013   Hyperlipidemia    Osteoporosis    Scoliosis    Uterine prolapse     Family History  Problem Relation Age of Onset   Pancreatic cancer Mother        metastasized to colon   Alcohol abuse Brother    Diabetes Brother    Cancer Maternal Aunt        breast   Colon cancer Maternal Grandmother     Past Surgical History:  Procedure Laterality Date   COLONOSCOPY     ESOPHAGOGASTRODUODENOSCOPY     harrington rod--scoliosis     PARTIAL HYSTERECTOMY     Social History   Occupational History   Occupation: self-employed    Fish farm manager: OTHER    Comment: Psychologist, prison and probation services  Tobacco Use   Smoking status: Never   Smokeless tobacco: Never  Vaping Use   Vaping Use: Never used  Substance and Sexual Activity   Alcohol use: Yes    Comment: socially   Drug use: Never   Sexual activity: Yes    Birth control/protection: Surgical

## 2021-11-27 NOTE — Assessment & Plan Note (Signed)
T score is -2.6 on 12/14/2020.  Recommend with recent fractures that she consider treatment for osteoporosis.  She is talking about doing Evenity with her sports medicine provider as they do provide this injection there.  Encouraged her to talk about this and schedule this when able.  Also discussed continuing calcium and vitamin D, along with weightbearing exercises.

## 2021-11-27 NOTE — Assessment & Plan Note (Signed)
Chronic, stable.  Continue Lexapro 10 mg daily.  Denies SI/HI.  Follow-up in 6 months.

## 2021-11-27 NOTE — Assessment & Plan Note (Signed)
Acute after fall on 10/26/2021.  She is currently following with sports medicine and has referral placed to orthopedics.  She has not yet heard from them, will place a referral as well.  She is hoping to get in with [facility] and Dr. Stann Mainland.  We will refill her hydrocodone every 8 hours as needed.  PDMP reviewed.

## 2021-11-27 NOTE — Assessment & Plan Note (Signed)
She has been experiencing hot flashes.  She would like her hormone levels checked, will check estrogen, FSH, LH, testosterone.

## 2021-11-27 NOTE — Assessment & Plan Note (Signed)
She has a history of vitamin D deficiency and is currently taking an over-the-counter supplement after finishing a course of prescription.  We will check vitamin D levels and adjust regimen based on results.

## 2021-11-27 NOTE — Assessment & Plan Note (Signed)
She has a history of elevated cholesterol.  She did not tolerate the fenofibrate.  She was prescribed rosuvastatin, however has not started this.  We will check lipid panel today and adjust regimen based on results.

## 2021-11-27 NOTE — Assessment & Plan Note (Signed)
Noted after fall on 10/26/2021.  She is not having any pain in the right hip.  She has a referral that is being worked on to orthopedics.  Reach out if she is unable to get an appointment.

## 2021-11-27 NOTE — Assessment & Plan Note (Signed)
She has a history of scoliosis that required surgery when she was younger.

## 2021-11-27 NOTE — Assessment & Plan Note (Signed)
Acute after fall on 10/26/2021.  She is currently working with sports medicine and will be seeing orthopedics.  We will refill her hydrocodone every 8 hours as needed.  PDMP reviewed.

## 2021-11-30 ENCOUNTER — Encounter: Payer: Self-pay | Admitting: Nurse Practitioner

## 2021-11-30 DIAGNOSIS — Z800 Family history of malignant neoplasm of digestive organs: Secondary | ICD-10-CM

## 2021-11-30 DIAGNOSIS — Z803 Family history of malignant neoplasm of breast: Secondary | ICD-10-CM

## 2021-12-03 MED ORDER — ROSUVASTATIN CALCIUM 20 MG PO TABS: 20.0000 mg | ORAL_TABLET | Freq: Every day | ORAL | 3 refills | Status: DC

## 2021-12-12 ENCOUNTER — Ambulatory Visit: Payer: Commercial Managed Care - HMO | Admitting: Family Medicine

## 2021-12-18 LAB — IRON,TIBC AND FERRITIN PANEL
%SAT: 7 % (calc) — ABNORMAL LOW (ref 16–45)
Ferritin: 4 ng/mL — ABNORMAL LOW (ref 16–232)
Iron: 28 ug/dL — ABNORMAL LOW (ref 45–160)
TIBC: 400 mcg/dL (calc) (ref 250–450)

## 2021-12-18 LAB — BRCA PANEL (BRCA1, BRCA2)
CLINICAL INTERPRETATION: NEGATIVE
RESULT: NEGATIVE

## 2021-12-19 NOTE — Addendum Note (Signed)
Addended by: Vance Peper A on: 12/19/2021 07:56 PM   Modules accepted: Orders

## 2021-12-21 NOTE — Telephone Encounter (Signed)
Caller Name: Eathel Pajak Call back phone #: [phone]  Reason for Call: Please call pt to advise which type of iron supplement is best recommended.

## 2021-12-25 NOTE — Addendum Note (Signed)
Addended by: Vance Peper A on: 12/25/2021 02:23 PM   Modules accepted: Orders

## 2021-12-26 ENCOUNTER — Telehealth: Payer: Self-pay | Admitting: Neurology

## 2021-12-26 ENCOUNTER — Other Ambulatory Visit (INDEPENDENT_AMBULATORY_CARE_PROVIDER_SITE_OTHER): Payer: Commercial Managed Care - HMO

## 2021-12-26 DIAGNOSIS — D509 Iron deficiency anemia, unspecified: Secondary | ICD-10-CM

## 2021-12-26 LAB — CBC
HCT: 30.7 % — ABNORMAL LOW (ref 36.0–46.0)
Hemoglobin: 10 g/dL — ABNORMAL LOW (ref 12.0–15.0)
MCHC: 32.4 g/dL (ref 30.0–36.0)
MCV: 85.4 fl (ref 78.0–100.0)
Platelets: 332 10*3/uL (ref 150.0–400.0)
RBC: 3.6 Mil/uL — ABNORMAL LOW (ref 3.87–5.11)
RDW: 16.6 % — ABNORMAL HIGH (ref 11.5–15.5)
WBC: 7.5 10*3/uL (ref 4.0–10.5)

## 2021-12-26 NOTE — Telephone Encounter (Signed)
[employer] pending faxed notes  

## 2022-01-01 NOTE — Telephone Encounter (Signed)
LVM for pt to call back to schedule sleep study.  

## 2022-01-02 NOTE — Telephone Encounter (Signed)
Patient returned the call.  HST- [employer] no auth req ref # [id number].  Patient is scheduled at [facility] for 01/22/22 at 11 AM.

## 2022-01-03 ENCOUNTER — Other Ambulatory Visit: Payer: Self-pay

## 2022-01-04 ENCOUNTER — Encounter: Payer: Self-pay | Admitting: Family Medicine

## 2022-01-04 ENCOUNTER — Ambulatory Visit: Payer: Commercial Managed Care - HMO | Admitting: Family Medicine

## 2022-01-04 VITALS — BP 130/70 | Ht 69.0 in | Wt 147.0 lb

## 2022-01-04 DIAGNOSIS — D508 Other iron deficiency anemias: Secondary | ICD-10-CM | POA: Diagnosis not present

## 2022-01-04 DIAGNOSIS — D649 Anemia, unspecified: Secondary | ICD-10-CM | POA: Insufficient documentation

## 2022-01-04 DIAGNOSIS — E559 Vitamin D deficiency, unspecified: Secondary | ICD-10-CM

## 2022-01-04 DIAGNOSIS — S3282XD Multiple fractures of pelvis without disruption of pelvic ring, subsequent encounter for fracture with routine healing: Secondary | ICD-10-CM | POA: Diagnosis not present

## 2022-01-04 MED ORDER — VITAMIN D (ERGOCALCIFEROL) 1.25 MG (50000 UNIT) PO CAPS: 50000.0000 [IU] | ORAL_CAPSULE | ORAL | 0 refills | Status: DC

## 2022-01-04 NOTE — Assessment & Plan Note (Signed)
Acutely on chronic. Has a partial hysterectomy 2015. Noticed the last 6 months. Feels fatigued on a regular basis.  - counseled on supportive care - referral to hematology for consideration of infusion.

## 2022-01-04 NOTE — Assessment & Plan Note (Signed)
Will send in vitamin D

## 2022-01-04 NOTE — Progress Notes (Signed)
  Roberta Sims - 51 y.o. female MRN [id number]  Date of birth: [birth year]  SUBJECTIVE:  Including CC & ROS.  No chief complaint on file.   Roberta Sims is a 51 y.o. female that is following up for her pelvic fractures.  Continues to have pain but is able to walk more.  She has transition to 1 crutch at this time.   Review of Systems See HPI   HISTORY: Past Medical, Surgical, Social, and Family History Reviewed & Updated per EMR.   Pertinent Historical Findings include:  Past Medical History:  Diagnosis Date   Anxiety    Compression fracture of thoracic spine, non-traumatic, sequela    Dysphagia    GERD with stricture 03/04/2019   Hx of sessile serrated colonic polyp 06/28/2013   Hyperlipidemia    Osteoporosis    Scoliosis    Uterine prolapse     Past Surgical History:  Procedure Laterality Date   COLONOSCOPY     ESOPHAGOGASTRODUODENOSCOPY     harrington rod--scoliosis     PARTIAL HYSTERECTOMY       PHYSICAL EXAM:  VS: BP 130/70   Ht 5\' 9"  (1.753 m)   Wt 147 lb (66.7 kg)   BMI 21.71 kg/m  Physical Exam Gen: NAD, alert, cooperative with exam, well-appearing MSK:  Neurovascularly intact       ASSESSMENT & PLAN:   Multiple closed pelvic fractures without disruption of pelvic circle (HCC) Initial injury in early September.  Is slowly improving in her pain.  The pain is still completely on the left side.  Has transition to 1 crutch. -Counseled on home exercise therapy and supportive care. -Referral to physical therapy.  Could consider pelvic floor rehab.   Vitamin D deficiency Will send in vitamin D   Absolute anemia Acutely on chronic. Has a partial hysterectomy 2015. Noticed the last 6 months. Feels fatigued on a regular basis.  - counseled on supportive care - referral to hematology for consideration of infusion.

## 2022-01-04 NOTE — Assessment & Plan Note (Signed)
Initial injury in early September.  Is slowly improving in her pain.  The pain is still completely on the left side.  Has transition to 1 crutch. -Counseled on home exercise therapy and supportive care. -Referral to physical therapy.  Could consider pelvic floor rehab.

## 2022-01-04 NOTE — Patient Instructions (Signed)
Good to see you I have made a referral to physical therapy  Please see if they offer pelvic floor rehab there  Please send me a message in MyChart with any questions or updates.  Please see me back as needed.   --Dr. Jordan Likes

## 2022-01-10 NOTE — Progress Notes (Signed)
OUTPATIENT PHYSICAL THERAPY THORACOLUMBAR EVALUATION   Patient Name: Roberta Sims MRN: [id number] DOB:14-Jul-1970, 51 y.o., female Today's Date: 01/11/2022  END OF SESSION:  PT End of Session - 01/11/22 1157     Visit Number 1    Date for PT Re-Evaluation 03/22/22    PT Start Time 1058    PT Stop Time 1140    PT Time Calculation (min) 42 min    Activity Tolerance Patient tolerated treatment well;Patient limited by pain    Behavior During Therapy [facility] for tasks assessed/performed             Past Medical History:  Diagnosis Date   Anxiety    Compression fracture of thoracic spine, non-traumatic, sequela    Dysphagia    GERD with stricture 03/04/2019   Hx of sessile serrated colonic polyp 06/28/2013   Hyperlipidemia    Osteoporosis    Scoliosis    Uterine prolapse    Past Surgical History:  Procedure Laterality Date   COLONOSCOPY     ESOPHAGOGASTRODUODENOSCOPY     harrington rod--scoliosis     PARTIAL HYSTERECTOMY     Patient Active Problem List   Diagnosis Date Noted   Absolute anemia 01/04/2022   Insomnia 11/27/2021   Multiple closed pelvic fractures without disruption of pelvic circle (HCC) 11/27/2021   Closed nondisplaced zone III fracture of sacrum (HCC) 11/20/2021   Stress fracture of right femur 11/20/2021   Non-restorative sleep 11/07/2021   Snoring 11/07/2021   Thoracogenic scoliosis of thoracic region 11/07/2021   GERD with apnea 11/07/2021   Nocturia 11/07/2021   Pelvic fracture (HCC) 11/06/2021   Osteoporosis 06/29/2021   Hyperlipidemia 06/29/2021   Acute bilateral thoracic back pain 03/07/2021   Compression fracture of T12 vertebra (HCC) 12/11/2020   Compression fracture of L1 lumbar vertebra (HCC) 12/11/2020   Estrogen deficiency 12/11/2020   Vitamin D deficiency 12/11/2020   History of thoracic spinal fusion 11/23/2020   Piriformis syndrome of left side 11/14/2020   GERD with stricture 03/04/2019   Esophageal dysphagia 01/07/2019    Essential familial hyperlipidemia 04/24/2018   Hypertriglyceridemia, familial 04/24/2018   Anxiety 04/23/2018   H/O scoliosis 04/23/2018   Impaired fasting glucose 04/23/2018   Exposure to hepatitis C 03/25/2018   Menopausal symptoms 03/25/2018   Pain of left side of body 03/25/2018   Hx of colonic polyps 06/28/2013    PCP: Gerre Scull, NP  REFERRING PROVIDER: Myra Rude, MD  REFERRING DIAG:  Diagnosis  S32.82XD (ICD-10-CM) - Multiple closed fractures of pelvis without disruption of pelvic ring with routine healing, subsequent encounter    Rationale for Evaluation and Treatment: Rehabilitation  THERAPY DIAG:  Muscle weakness (generalized)  Difficulty in walking, not elsewhere classified  Other lack of coordination  Unsteadiness on feet  Acute hip pain, bilateral  ONSET DATE: 10/26/21  SUBJECTIVE:  SUBJECTIVE STATEMENT: Patient reports fall in early Sept. Initially did not realize there was any injury. Several weeks later she was having enough pain that she had an MRI which showed multiple L pelvic and R femoral neck fractures. She has progressed to using a single crutch. She is also anemic.  PERTINENT HISTORY:   51 y.o. female that is following up for her pelvic fractures.  Continues to have pain but is able to walk more.  She has transition to 1 crutch at this time. Multiple closed pelvic fractures without disruption of pelvic circle (HCC) Initial injury in early September.  Is slowly improving in her pain.  The pain is still completely on the left side.  Has transition to 1 crutch. -Counseled on home exercise therapy and supportive care. -Referral to physical therapy.  Could consider pelvic floor rehab. Patient seen last year for compression fractures at T12, L1 with L lateral  knee pain, ITB pain, L hip pain with tight and weak piriformis     PAIN:  Are you having pain? Yes: NPRS scale: 6/10 Pain location: L groin Pain description: sharp Aggravating factors: walking Relieving factors: uses the crutch for gait  PRECAUTIONS: None  WEIGHT BEARING RESTRICTIONS: No  FALLS:  Has patient fallen in last 6 months? Yes. Number of falls 1  LIVING ENVIRONMENT: Lives with: lives with their spouse Lives in: House/apartment Stairs: Yes: Internal: N/A-can stay on first floor steps; on left going up and External: 2 steps; none Has following equipment at home: Crutches  OCCUPATION: Works from home  PLOF: Independent  PATIENT GOALS: Patient would like to get Leg stronger, learn some overall strengthening and Wbing exercises to combat the bone loss.  NEXT MD VISIT:   OBJECTIVE:   DIAGNOSTIC FINDINGS:  MRI 11/19/21 IMPRESSION: Nondisplaced fracture of the left parasymphyseal pubic bone with intense associated marrow edema.   Contusion or nondisplaced fracture of the sacrum centered at S4, with marrow edema extending into the S3 and S5 vertebral bodies.   Probable stress fracture at the base of the right femoral neck extending into the intertrochanteric region, involving approximately 40% bone with.    PATIENT SURVEYS:  FOTO 46  SCREENING FOR RED FLAGS: Bowel or bladder incontinence: No Spinal tumors: No Cauda equina syndrome: No Compression fracture: No Abdominal aneurysm: No  COGNITION: Overall cognitive status: Within functional limits for tasks assessed     SENSATION: Light touch: Impaired   MUSCLE LENGTH: Hamstrings: Right 60 deg; Left 54 deg Thomas test: unable to assess due to pain  POSTURE: decreased lumbar lordosis and decreased thoracic kyphosis  PALPATION: TTP lower lumbar paraspinals, B gluts.   LUMBAR ROM:   AROM eval  Flexion WFL  Extension WFL  Right lateral flexion WFL  Left lateral flexion WFL  Right rotation WFL   Left rotation WFL   (Blank rows = not tested)  LOWER EXTREMITY ROM: B knees and ankles WNL    Passive  Right eval Left eval  Hip flexion 100 100  Hip extension 0 0  Hip abduction    Hip adduction    Hip internal rotation WNL WNL  Hip external rotation WNL WNL  Knee flexion    Knee extension    Ankle dorsiflexion    Ankle plantarflexion    Ankle inversion    Ankle eversion     (Blank rows = not tested)  LOWER EXTREMITY MMT:  Unable to assess hip abd/add strength due to pain. B knees and ankles WFL, but mildly diminished due  to inactivity over the past 10 weeks  MMT Right eval Left eval  Hip flexion 3+ 3+  Hip extension 3+ 3+  Hip abduction    Hip adduction    Hip internal rotation    Hip external rotation    Knee flexion    Knee extension    Ankle dorsiflexion    Ankle plantarflexion    Ankle inversion    Ankle eversion     (Blank rows = not tested)   FUNCTIONAL TESTS:  5 times sit to stand: 16.47 Timed up and go (TUG): 20.61  GAIT: Distance walked: In clinic distances Assistive device utilized:  1 crutch Level of assistance: Complete Independence and Modified independence Comments: Walked in using a crutch. Gait without crutch slow, tentative.  TODAY'S TREATMENT:                                                                                                                              DATE:  01/11/22 Education    PATIENT EDUCATION:  Education details: POC, HEP Person educated: Patient Education method: Explanation Education comprehension: verbalized understanding  HOME EXERCISE PROGRAM:  [id number]  ASSESSMENT:  CLINICAL IMPRESSION: Patient is a 51 y.o.  who was seen today for physical therapy evaluation and treatment for F/U 10 weeks after multiple fractures, L pubic symphysis, L sacrum at S3, R femoral neck-40% stress fracture. She reports feeling weak and knows she needs to mobilize, but fearful. She does show weakness in hips, with decreased  ROM as well, decreased balance and functional mobility. She will benefit from PT to establish and progress appropriate strength and mobilization activities to promote return to PLOF. She will be starting water activities, including walking in the pool this week.   OBJECTIVE IMPAIRMENTS: Abnormal gait, decreased activity tolerance, decreased balance, decreased coordination, decreased mobility, difficulty walking, decreased ROM, decreased strength, increased muscle spasms, impaired flexibility, postural dysfunction, and pain.   ACTIVITY LIMITATIONS: carrying, lifting, bending, standing, squatting, sleeping, and locomotion level  PARTICIPATION LIMITATIONS: meal prep, cleaning, laundry, shopping, community activity, and yard work  PERSONAL FACTORS: Past/current experiences are also affecting patient's functional outcome.   REHAB POTENTIAL: Good  CLINICAL DECISION MAKING: Stable/uncomplicated  EVALUATION COMPLEXITY: Moderate   GOALS: Goals reviewed with patient? Yes  SHORT TERM GOALS: Target date: 02/08/22  I with basic HEP Baseline: Goal status: INITIAL  LONG TERM GOALS: Target date: 03/22/22  I with final HEP Baseline:  Goal status: INITIAL  2.  Increase FOTO score to at least 65 Baseline: 47 Goal status: INITIAL  3.  Decrease 5 x STS to < 12 sec Baseline: 16.47 Goal status: INITIAL  4.  Decrease TUG time to < 12 sec Baseline: 20.61 Goal status: INITIAL  5.  Patient will return to her normal daily activities and ambulation with no pain, normal gait pattern, no AD Baseline: pain up to 6/10, very slow and tentative gait, limited in distance to < 200', with U crutch. Goal status:  INITIAL  PLAN:  PT FREQUENCY: 1-2x/week  PT DURATION: 10 weeks  PLANNED INTERVENTIONS: Therapeutic exercises, Therapeutic activity, Neuromuscular re-education, Balance training, Gait training, Patient/Family education, Self Care, Joint mobilization, Dry Needling, Cryotherapy, Moist heat,  Ionotophoresis 4mg /ml Dexamethasone, and Manual therapy.  PLAN FOR NEXT SESSION: Update HEP based upon her response to initial exercises, gait training, strength and mobility.   Iona BeardSusan M Jazzmen Restivo, DPT 01/11/2022, 12:04 PM

## 2022-01-11 ENCOUNTER — Encounter: Payer: Self-pay | Admitting: Physical Therapy

## 2022-01-11 ENCOUNTER — Ambulatory Visit: Payer: Commercial Managed Care - HMO | Attending: Family Medicine | Admitting: Physical Therapy

## 2022-01-11 DIAGNOSIS — M25552 Pain in left hip: Secondary | ICD-10-CM | POA: Insufficient documentation

## 2022-01-11 DIAGNOSIS — S3282XD Multiple fractures of pelvis without disruption of pelvic ring, subsequent encounter for fracture with routine healing: Secondary | ICD-10-CM | POA: Diagnosis not present

## 2022-01-11 DIAGNOSIS — M25551 Pain in right hip: Secondary | ICD-10-CM | POA: Diagnosis present

## 2022-01-11 DIAGNOSIS — M6281 Muscle weakness (generalized): Secondary | ICD-10-CM | POA: Diagnosis not present

## 2022-01-11 DIAGNOSIS — R2681 Unsteadiness on feet: Secondary | ICD-10-CM | POA: Diagnosis present

## 2022-01-11 DIAGNOSIS — R278 Other lack of coordination: Secondary | ICD-10-CM | POA: Diagnosis present

## 2022-01-11 DIAGNOSIS — R262 Difficulty in walking, not elsewhere classified: Secondary | ICD-10-CM | POA: Insufficient documentation

## 2022-01-22 ENCOUNTER — Ambulatory Visit: Payer: Commercial Managed Care - HMO | Admitting: Neurology

## 2022-01-22 ENCOUNTER — Other Ambulatory Visit: Payer: Self-pay | Admitting: Family

## 2022-01-22 DIAGNOSIS — R0683 Snoring: Secondary | ICD-10-CM

## 2022-01-22 DIAGNOSIS — G478 Other sleep disorders: Secondary | ICD-10-CM

## 2022-01-22 DIAGNOSIS — D649 Anemia, unspecified: Secondary | ICD-10-CM

## 2022-01-22 DIAGNOSIS — G2581 Restless legs syndrome: Secondary | ICD-10-CM

## 2022-01-22 DIAGNOSIS — G4733 Obstructive sleep apnea (adult) (pediatric): Secondary | ICD-10-CM | POA: Diagnosis not present

## 2022-01-22 DIAGNOSIS — M4134 Thoracogenic scoliosis, thoracic region: Secondary | ICD-10-CM

## 2022-01-22 DIAGNOSIS — K219 Gastro-esophageal reflux disease without esophagitis: Secondary | ICD-10-CM

## 2022-01-22 DIAGNOSIS — R351 Nocturia: Secondary | ICD-10-CM

## 2022-01-23 ENCOUNTER — Encounter: Payer: Self-pay | Admitting: Family

## 2022-01-23 ENCOUNTER — Inpatient Hospital Stay (HOSPITAL_BASED_OUTPATIENT_CLINIC_OR_DEPARTMENT_OTHER): Payer: Commercial Managed Care - HMO | Admitting: Family

## 2022-01-23 ENCOUNTER — Inpatient Hospital Stay: Payer: Commercial Managed Care - HMO | Attending: Hematology & Oncology

## 2022-01-23 VITALS — Temp 98.0°F | Resp 19 | Ht 69.0 in | Wt 154.0 lb

## 2022-01-23 DIAGNOSIS — Z800 Family history of malignant neoplasm of digestive organs: Secondary | ICD-10-CM | POA: Diagnosis not present

## 2022-01-23 DIAGNOSIS — D509 Iron deficiency anemia, unspecified: Secondary | ICD-10-CM | POA: Insufficient documentation

## 2022-01-23 DIAGNOSIS — D500 Iron deficiency anemia secondary to blood loss (chronic): Secondary | ICD-10-CM

## 2022-01-23 DIAGNOSIS — Z803 Family history of malignant neoplasm of breast: Secondary | ICD-10-CM

## 2022-01-23 DIAGNOSIS — Z808 Family history of malignant neoplasm of other organs or systems: Secondary | ICD-10-CM

## 2022-01-23 DIAGNOSIS — Z79899 Other long term (current) drug therapy: Secondary | ICD-10-CM | POA: Diagnosis not present

## 2022-01-23 DIAGNOSIS — D649 Anemia, unspecified: Secondary | ICD-10-CM

## 2022-01-23 DIAGNOSIS — M810 Age-related osteoporosis without current pathological fracture: Secondary | ICD-10-CM

## 2022-01-23 LAB — CMP (CANCER CENTER ONLY)
ALT: 12 U/L (ref 0–44)
AST: 14 U/L — ABNORMAL LOW (ref 15–41)
Albumin: 4.6 g/dL (ref 3.5–5.0)
Alkaline Phosphatase: 99 U/L (ref 38–126)
Anion gap: 8 (ref 5–15)
BUN: 15 mg/dL (ref 6–20)
CO2: 27 mmol/L (ref 22–32)
Calcium: 9.5 mg/dL (ref 8.9–10.3)
Chloride: 104 mmol/L (ref 98–111)
Creatinine: 0.7 mg/dL (ref 0.44–1.00)
GFR, Estimated: >60 mL/min (ref >60)
Glucose, Bld: 103 mg/dL — ABNORMAL HIGH (ref 70–99)
Potassium: 4 mmol/L (ref 3.5–5.1)
Sodium: 139 mmol/L (ref 135–145)
Total Bilirubin: 0.4 mg/dL (ref 0.3–1.2)
Total Protein: 7.2 g/dL (ref 6.5–8.1)

## 2022-01-23 LAB — CBC WITH DIFFERENTIAL (CANCER CENTER ONLY)
Abs Immature Granulocytes: 0.06 10*3/uL (ref 0.00–0.07)
Basophils Absolute: 0.1 10*3/uL (ref 0.0–0.1)
Basophils Relative: 2 %
Eosinophils Absolute: 0.5 10*3/uL (ref 0.0–0.5)
Eosinophils Relative: 8 %
HCT: 29.8 % — ABNORMAL LOW (ref 36.0–46.0)
Hemoglobin: 8.9 g/dL — ABNORMAL LOW (ref 12.0–15.0)
Immature Granulocytes: 1 %
Lymphocytes Relative: 25 %
Lymphs Abs: 1.4 10*3/uL (ref 0.7–4.0)
MCH: 26.2 pg (ref 26.0–34.0)
MCHC: 29.9 g/dL — ABNORMAL LOW (ref 30.0–36.0)
MCV: 87.6 fL (ref 80.0–100.0)
Monocytes Absolute: 0.6 10*3/uL (ref 0.1–1.0)
Monocytes Relative: 10 %
Neutro Abs: 3.2 10*3/uL (ref 1.7–7.7)
Neutrophils Relative %: 54 %
Platelet Count: 406 10*3/uL — ABNORMAL HIGH (ref 150–400)
RBC: 3.4 MIL/uL — ABNORMAL LOW (ref 3.87–5.11)
RDW: 15 % (ref 11.5–15.5)
WBC Count: 5.8 10*3/uL (ref 4.0–10.5)
nRBC: 0 % (ref 0.0–0.2)

## 2022-01-23 LAB — RETICULOCYTES
Immature Retic Fract: 23.6 % — ABNORMAL HIGH (ref 2.3–15.9)
RBC.: 3.35 MIL/uL — ABNORMAL LOW (ref 3.87–5.11)
Retic Count, Absolute: 53.9 10*3/uL (ref 19.0–186.0)
Retic Ct Pct: 1.6 % (ref 0.4–3.1)

## 2022-01-23 LAB — LACTATE DEHYDROGENASE: LDH: 112 U/L (ref 98–192)

## 2022-01-23 LAB — FERRITIN: Ferritin: 3 ng/mL — ABNORMAL LOW (ref 11–307)

## 2022-01-23 NOTE — Progress Notes (Signed)
Hematology/Oncology Consultation   Name: Roberta Sims      MRN: [id number]    Location: Room/bed info not found  Date: 01/23/2022 Time:10:47 AM   REFERRING PHYSICIAN: Clearance Coots, MD  REASON FOR CONSULT:  Other iron deficiency anemia   DIAGNOSIS: Iron deficiency anemia   HISTORY OF PRESENT ILLNESS:  Roberta Sims is a very pleasant 50 yo caucasian female with recent persistent iron deficiency anemia.  She notes fatigue and occasional chest discomfort that comes and goes.  She states that her stool has been dark. She has abdominal cramping with constipation.  She is unable to tolerate oral iron secondary to GERD. She states that she has trouble swallowing foods and feeling like it stays sitting in her chest for a bit. This can also be painful.  No history of stomach surgery.  She had her last colonoscopy back in January 2021 with Dr. Carlean Purl. Results were negative. EGD at that same time showed tortuous esophagus and hiatal hernia. She had esophageal dilation. She was also noted to have gastritis.  She had a partial hysterectomy in 2015.  Her vitamin D was also noted to be low recently.  She has an appointment coming up with dermatology to assess some irregular spots on her face.  No personal history of cancer. Family history includes: mother with metastatic pancreatic cancer, sister with squamous cell carcinoma removed from her nose, maternal aunt with breast, maternal grandmother colon, paternal grandfather colon and maternal great grandmother with breast.  No history of diabetes or thyroid disease.  No fever, chills, n/v, cough, rash, dizziness, SOB, palpitations or changes in bladder habits.  No swelling in her extremities.  She has significant osteoporosis which has led to multiple incidences of fractures in the spine and pelvis over the last year. She is discussing with her PCP about how to treat.  She has numbness and tingling that comes and goes in her hands as well as her lower  extremities (cramping).  No new falls or syncope to report.  Appetite and hydration are good. Weight is stable at 154 lbs.  No smoking or recreational drug use. ETOH socially.  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   ROS: All other 10 point review of systems is negative.   PAST MEDICAL HISTORY:   Past Medical History:  Diagnosis Date   Anxiety    Compression fracture of thoracic spine, non-traumatic, sequela    Dysphagia    GERD with stricture 03/04/2019   Hx of sessile serrated colonic polyp 06/28/2013   Hyperlipidemia    Osteoporosis    Scoliosis    Uterine prolapse     ALLERGIES: Allergies  Allergen Reactions   Meloxicam Other (See Comments)    Headache      MEDICATIONS:  Current Outpatient Medications on File Prior to Visit  Medication Sig Dispense Refill   Ascorbic Acid (VITAMIN C) 100 MG tablet Take 100 mg by mouth daily.     Cetirizine HCl (ZYRTEC PO) Take by mouth.     cyclobenzaprine (FLEXERIL) 10 MG tablet Take 1 tablet (10 mg total) by mouth 3 (three) times daily as needed for muscle spasms. 90 tablet 1   escitalopram (LEXAPRO) 10 MG tablet Take 1 tablet (10 mg total) by mouth daily. 90 tablet 3   HYDROcodone-acetaminophen (NORCO/VICODIN) 5-325 MG tablet Take 1 tablet by mouth every 8 (eight) hours as needed. 15 tablet 0   omeprazole (PRILOSEC) 40 MG capsule TAKE ONE CAPSULE BY MOUTH ONE  TIME DAILY BEFORE BREAKFAST 90 capsule 3   rosuvastatin (CRESTOR) 20 MG tablet Take 1 tablet (20 mg total) by mouth daily. 90 tablet 3   Vitamin D, Ergocalciferol, (DRISDOL) 1.25 MG (50000 UNIT) CAPS capsule Take 1 capsule (50,000 Units total) by mouth every 7 (seven) days. Take for 8 total doses(weeks) 8 capsule 0   zinc gluconate 50 MG tablet Take 50 mg by mouth daily.     zolpidem (AMBIEN) 5 MG tablet Take 1 tablet (5 mg total) by mouth at bedtime as needed for sleep. 30 tablet 0   No current facility-administered medications on file prior to  visit.     PAST SURGICAL HISTORY Past Surgical History:  Procedure Laterality Date   COLONOSCOPY     ESOPHAGOGASTRODUODENOSCOPY     harrington rod--scoliosis     PARTIAL HYSTERECTOMY      FAMILY HISTORY: Family History  Problem Relation Age of Onset   Pancreatic cancer Mother        metastasized to colon   Alcohol abuse Brother    Diabetes Brother    Cancer Maternal Aunt        breast   Colon cancer Maternal Grandmother     SOCIAL HISTORY:  reports that she has never smoked. She has never used smokeless tobacco. She reports current alcohol use. She reports that she does not use drugs.  PERFORMANCE STATUS: The patient's performance status is 1 - Symptomatic but completely ambulatory  PHYSICAL EXAM: Most Recent Vital Signs: There were no vitals taken for this visit. There were no vitals taken for this visit.  General Appearance:    Alert, cooperative, no distress, appears stated age  Head:    Normocephalic, without obvious abnormality, atraumatic  Eyes:    PERRL, conjunctiva/corneas clear, EOM's intact, fundi    benign, both eyes        Throat:   Lips, mucosa, and tongue normal; teeth and gums normal  Neck:   Supple, symmetrical, trachea midline, no adenopathy;    thyroid:  no enlargement/tenderness/nodules; no carotid   bruit or JVD  Back:     Symmetric, no curvature, ROM normal, no CVA tenderness  Lungs:     Clear to auscultation bilaterally, respirations unlabored  Chest Wall:    No tenderness or deformity   Heart:    Regular rate and rhythm, S1 and S2 normal, no murmur, rub   or gallop     Abdomen:     Soft, non-tender, bowel sounds active all four quadrants,    no masses, no organomegaly        Extremities:   Extremities normal, atraumatic, no cyanosis or edema  Pulses:   2+ and symmetric all extremities  Skin:   Skin color, texture, turgor normal, no rashes or lesions  Lymph nodes:   Cervical, supraclavicular, and axillary nodes normal  Neurologic:    CNII-XII intact, normal strength, sensation and reflexes    throughout    LABORATORY DATA:  Results for orders placed or performed in visit on 01/23/22 (from the past 48 hour(s))  Reticulocytes     Status: Abnormal   Collection Time: 01/23/22 10:36 AM  Result Value Ref Range   Retic Ct Pct 1.6 0.4 - 3.1 %   RBC. 3.35 (L) 3.87 - 5.11 MIL/uL   Retic Count, Absolute 53.9 19.0 - 186.0 K/uL   Immature Retic Fract 23.6 (H) 2.3 - 15.9 %    Comment: Performed at [facility] at [facility], [address], [place], [place] [address]      RADIOGRAPHY: No results found.     PATHOLOGY: None  ASSESSMENT/PLAN: Ms. Laidig is a very pleasant 51 yo caucasian female with recent persistent iron deficiency anemia.  We will get her set up for 3 doses of Venofer.  Referred patient back to GI Dr. Carlean Purl for further eval. Patient has supplies and will bring her stool specimens back in to test for occult blood.  Follow-up in 8 weeks.   All questions were answered. The patient knows to call the clinic with any problems, questions or concerns. We can certainly see the patient much sooner if necessary.   Lottie Dawson, NP

## 2022-01-24 LAB — IRON AND IRON BINDING CAPACITY (CC-WL,HP ONLY)
Iron: 5 ug/dL — ABNORMAL LOW (ref 28–170)
Saturation Ratios: 1 % — ABNORMAL LOW (ref 10.4–31.8)
TIBC: 510 ug/dL — ABNORMAL HIGH (ref 250–450)
UIBC: 505 ug/dL — ABNORMAL HIGH (ref 148–442)

## 2022-01-24 LAB — ERYTHROPOIETIN: Erythropoietin: 72.3 m[IU]/mL — ABNORMAL HIGH (ref 2.6–18.5)

## 2022-01-24 NOTE — Progress Notes (Signed)
See procedure note.

## 2022-01-25 ENCOUNTER — Other Ambulatory Visit: Payer: Self-pay | Admitting: Family

## 2022-01-27 NOTE — Procedures (Signed)
   [facility]  HOME SLEEP TEST (Watch PAT) REPORT  STUDY DATE: 01/22/22  DOB: [birth year]  MRN: [id number]  ORDERING CLINICIAN: Huston Foley, MD, PhD   REFERRING CLINICIAN: Norval Morton, Jake Church, NP (PCP), Dr. Vickey Huger (sleep)  CLINICAL INFORMATION/HISTORY: 51 yo female with a history of GERD, Scoliosis, statin induced Myopathy, who was previously diagnosed with sleep apnea. She is no longer on a PAP machine. She presents for re-evaluation.  Epworth sleepiness score: 12/24.  BMI: 22.9 kg/m  FINDINGS:   Sleep Summary:   Total Recording Time (hours, min): 8 hours, 54 min  Total Sleep Time (hours, min):  6 hours, 36 min  Percent REM (%):    17.4%   Respiratory Indices:   Calculated pAHI (per hour):  37.1/hour         REM pAHI:    35.8/hour       NREM pAHI: 37.4/hour  Central pAHI: 0/hour  Oxygen Saturation Statistics:    Oxygen Saturation (%) Mean: 94%   Minimum oxygen saturation (%):                 86%   O2 Saturation Range (%): 86 - 99%    O2 Saturation (minutes) <=88%: 0.3 min  Pulse Rate Statistics:   Pulse Mean (bpm):    75/min    Pulse Range (58 - 103/min)   IMPRESSION: OSA (obstructive sleep apnea)   RECOMMENDATION:  This home sleep test demonstrates severe obstructive sleep apnea - by number of events - with a total AHI of 37.1/hour and O2 nadir of 86%. The snore and body position channel were not available in this recording. Treatment with a positive airway pressure (PAP) device is recommended. The patient will be advised to proceed with an autoPAP titration/trial at home for now. A full night titration study may be considered to optimize treatment settings, monitor proper oxygen saturations and aid with improvement of tolerance and adherence, if needed down the road. Alternative treatment options may include a dental device through dentistry or orthodontics in selected patients or Inspire (hypoglossal nerve stimulator) in carefully  selected patients (meeting inclusion criteria).   Please note that untreated obstructive sleep apnea may carry additional perioperative morbidity. Patients with significant obstructive sleep apnea should receive perioperative PAP therapy and the surgeons and particularly the anesthesiologist should be informed of the diagnosis and the severity of the sleep disordered breathing. The patient should be cautioned not to drive, work at heights, or operate dangerous or heavy equipment when tired or sleepy. Review and reiteration of good sleep hygiene measures should be pursued with any patient. Other causes of the patient's symptoms, including circadian rhythm disturbances, an underlying mood disorder, medication effect and/or an underlying medical problem cannot be ruled out based on this test. Clinical correlation is recommended.  The patient and her referring provider will be notified of the test results. The patient will be seen in follow up in sleep clinic at [facility].  I certify that I have reviewed the raw data recording prior to the issuance of this report in accordance with the standards of the American Academy of Sleep Medicine (AASM).  INTERPRETING PHYSICIAN:   Huston Foley, MD, PhD  Board Certified in Neurology and Sleep Medicine  [facility] [address], [address] [place], [place] [address] [phone]

## 2022-01-28 ENCOUNTER — Ambulatory Visit: Payer: Commercial Managed Care - HMO | Admitting: Physical Therapy

## 2022-01-28 NOTE — Addendum Note (Signed)
Addended by: Huston Foley on: 01/28/2022 12:36 PM   Modules accepted: Orders

## 2022-01-30 ENCOUNTER — Encounter: Payer: Self-pay | Admitting: Physical Therapy

## 2022-01-30 ENCOUNTER — Telehealth: Payer: Self-pay | Admitting: *Deleted

## 2022-01-30 ENCOUNTER — Ambulatory Visit: Payer: Commercial Managed Care - HMO | Attending: Family Medicine | Admitting: Physical Therapy

## 2022-01-30 DIAGNOSIS — R2681 Unsteadiness on feet: Secondary | ICD-10-CM | POA: Diagnosis present

## 2022-01-30 DIAGNOSIS — M6281 Muscle weakness (generalized): Secondary | ICD-10-CM | POA: Insufficient documentation

## 2022-01-30 DIAGNOSIS — R262 Difficulty in walking, not elsewhere classified: Secondary | ICD-10-CM | POA: Diagnosis present

## 2022-01-30 DIAGNOSIS — M25552 Pain in left hip: Secondary | ICD-10-CM | POA: Diagnosis present

## 2022-01-30 DIAGNOSIS — M79605 Pain in left leg: Secondary | ICD-10-CM | POA: Insufficient documentation

## 2022-01-30 DIAGNOSIS — R278 Other lack of coordination: Secondary | ICD-10-CM | POA: Diagnosis present

## 2022-01-30 DIAGNOSIS — M25551 Pain in right hip: Secondary | ICD-10-CM | POA: Insufficient documentation

## 2022-01-30 NOTE — Telephone Encounter (Signed)
I called pt. I advised pt that Dr. Frances Furbish reviewed their sleep study results since Dr. Vickey Huger out and found that pt has severe sleep apnea. Dr. Frances Furbish recommends that pt start CPAP. I reviewed PAP compliance expectations with the pt. Pt is agreeable to starting a CPAP. I advised pt that an order will be sent to a DME, [facility], and [facility] will call the pt within about one week after they file with the pt's insurance. [facility] will show the pt how to use the machine, fit for masks, and troubleshoot the CPAP if needed. Provided pt [facility] phone# [phone] in case they do not reach out in the next week and she can call.  A follow up appt was made for insurance purposes with Dr. Vickey Huger 04/25/22 at 3:30pm.. Pt verbalized understanding to arrive 15 minutes early and bring their CPAP. Pt verbalized understanding of results. Pt had no questions at this time but was encouraged to call back if questions arise. I have sent the order to [facility] and have received confirmation that they have received the order.

## 2022-01-30 NOTE — Telephone Encounter (Signed)
-----   Message from Huston Foley, MD sent at 01/28/2022 12:36 PM EST ----- This patient saw Dr. Vickey Huger for sleep evaluation on 11/07/21.  I read the home sleep test from 01/22/2022 on Dr. Oliva Bustard behalf:   Please call and notify the patient that the recent home sleep test showed obstructive sleep apnea in the severe range. I recommend treatment for this in the form of autoPAP, which means, that we don't have to bring her in for a sleep study with CPAP, but will let her start using a so called autoPAP machine at home, through a DME company (of her choice, or as per insurance requirement). The DME representative will fit the patient with a mask of choice, educate her on how to use the machine, how to put the mask on, etc. I have placed an order in the chart. Please send the order to a local DME, talk to patient, send report to referring MD. Please also reinforce the need for compliance with treatment. We will need a FU in sleep clinic for 10 weeks post-PAP set up, please arrange that with Dr. D or one of our NPs. Thanks,   Huston Foley, MD, PhD [facility])

## 2022-01-30 NOTE — Therapy (Signed)
OUTPATIENT PHYSICAL THERAPY THORACOLUMBAR EVALUATION     Patient Name: Roberta Sims MRN: [id number] DOB:1970/09/25, 51 y.o., female Today's Date: 01/11/2022   END OF SESSION:   PT End of Session - 01/11/22 1157       Visit Number 1     Date for PT Re-Evaluation 03/22/22     PT Start Time 1058     PT Stop Time 1140     PT Time Calculation (min) 42 min     Activity Tolerance Patient tolerated treatment well;Patient limited by pain     [facility] for tasks assessed/performed                       Past Medical History:  Diagnosis Date   Anxiety     Compression fracture of thoracic spine, non-traumatic, sequela     Dysphagia     GERD with stricture 03/04/2019   Hx of sessile serrated colonic polyp 06/28/2013   Hyperlipidemia     Osteoporosis     Scoliosis     Uterine prolapse           Past Surgical History:  Procedure Laterality Date   COLONOSCOPY       ESOPHAGOGASTRODUODENOSCOPY       harrington rod--scoliosis       PARTIAL HYSTERECTOMY            Patient Active Problem List    Diagnosis Date Noted   Absolute anemia 01/04/2022   Insomnia 11/27/2021   Multiple closed pelvic fractures without disruption of pelvic circle (HCC) 11/27/2021   Closed nondisplaced zone III fracture of sacrum (HCC) 11/20/2021   Stress fracture of right femur 11/20/2021   Non-restorative sleep 11/07/2021   Snoring 11/07/2021   Thoracogenic scoliosis of thoracic region 11/07/2021   GERD with apnea 11/07/2021   Nocturia 11/07/2021   Pelvic fracture (HCC) 11/06/2021   Osteoporosis 06/29/2021   Hyperlipidemia 06/29/2021   Acute bilateral thoracic back pain 03/07/2021   Compression fracture of T12 vertebra (HCC) 12/11/2020   Compression fracture of L1 lumbar vertebra (HCC) 12/11/2020   Estrogen deficiency 12/11/2020   Vitamin D deficiency 12/11/2020   History of thoracic spinal fusion 11/23/2020   Piriformis syndrome of left side 11/14/2020   GERD with  stricture 03/04/2019   Esophageal dysphagia 01/07/2019   Essential familial hyperlipidemia 04/24/2018   Hypertriglyceridemia, familial 04/24/2018   Anxiety 04/23/2018   H/O scoliosis 04/23/2018   Impaired fasting glucose 04/23/2018   Exposure to hepatitis C 03/25/2018   Menopausal symptoms 03/25/2018   Pain of left side of body 03/25/2018   Hx of colonic polyps 06/28/2013      PCP: Gerre ScullMcElwee, Lauren A, NP   REFERRING PROVIDER: Myra RudeSchmitz, Jeremy E, MD   REFERRING DIAG:  Diagnosis  S32.82XD (ICD-10-CM) - Multiple closed fractures of pelvis without disruption of pelvic ring with routine healing, subsequent encounter      Rationale for Evaluation and Treatment: Rehabilitation   THERAPY DIAG:  Muscle weakness (generalized)   Difficulty in walking, not elsewhere classified   Other lack of coordination   Unsteadiness on feet   Acute hip pain, bilateral   ONSET DATE: 10/26/21   SUBJECTIVE:  SUBJECTIVE STATEMENT: Patient reports that she has weaned herself from her crutches and cane. She walked a lot over the weekend and had no issues during the walking, but did feel it the next day.   PERTINENT HISTORY:   51 y.o. female that is following up for her pelvic fractures.  Continues to have pain but is able to walk more.  She has transition to 1 crutch at this time. Multiple closed pelvic fractures without disruption of pelvic circle (HCC) Initial injury in early September.  Is slowly improving in her pain.  The pain is still completely on the left side.  Has transition to 1 crutch. -Counseled on home exercise therapy and supportive care. -Referral to physical therapy.  Could consider pelvic floor rehab. Patient seen last year for compression fractures at T12, L1 with L lateral knee pain, ITB pain, L hip  pain with tight and weak piriformis    PAIN:  Are you having pain? Yes: NPRS scale: 6/10 Pain location: L groin Pain description: sharp Aggravating factors: walking Relieving factors: uses the crutch for gait   PRECAUTIONS: None   WEIGHT BEARING RESTRICTIONS: No   FALLS:  Has patient fallen in last 6 months? Yes. Number of falls 1   LIVING ENVIRONMENT: Lives with: lives with their spouse Lives in: House/apartment Stairs: Yes: Internal: N/A-can stay on first floor steps; on left going up and External: 2 steps; none Has following equipment at home: Crutches   OCCUPATION: Works from home   PLOF: Independent   PATIENT GOALS: Patient would like to get Leg stronger, learn some overall strengthening and Wbing exercises to combat the bone loss.   NEXT MD VISIT:    OBJECTIVE:    DIAGNOSTIC FINDINGS:  MRI 11/19/21 IMPRESSION: Nondisplaced fracture of the left parasymphyseal pubic bone with intense associated marrow edema.   Contusion or nondisplaced fracture of the sacrum centered at S4, with marrow edema extending into the S3 and S5 vertebral bodies.   Probable stress fracture at the base of the right femoral neck extending into the intertrochanteric region, involving approximately 40% bone with.     PATIENT SURVEYS:  FOTO 46   SCREENING FOR RED FLAGS: Bowel or bladder incontinence: No Spinal tumors: No Cauda equina syndrome: No Compression fracture: No Abdominal aneurysm: No   COGNITION: Overall cognitive status: Within functional limits for tasks assessed                          SENSATION: Light touch: Impaired    MUSCLE LENGTH: Hamstrings: Right 60 deg; Left 54 deg Thomas test: unable to assess due to pain   POSTURE: decreased lumbar lordosis and decreased thoracic kyphosis   PALPATION: TTP lower lumbar paraspinals, B gluts.    LUMBAR ROM:    AROM eval  Flexion WFL  Extension WFL  Right lateral flexion WFL  Left lateral flexion WFL  Right  rotation WFL  Left rotation WFL   (Blank rows = not tested)   LOWER EXTREMITY ROM: B knees and ankles WNL     Passive  Right eval Left eval  Hip flexion 100 100  Hip extension 0 0  Hip abduction      Hip adduction      Hip internal rotation WNL WNL  Hip external rotation WNL WNL  Knee flexion      Knee extension      Ankle dorsiflexion      Ankle plantarflexion      Ankle inversion  Ankle eversion       (Blank rows = not tested)   LOWER EXTREMITY MMT:  Unable to assess hip abd/add strength due to pain. B knees and ankles WFL, but mildly diminished due to inactivity over the past 10 weeks   MMT Right eval Left eval  Hip flexion 3+ 3+  Hip extension 3+ 3+  Hip abduction      Hip adduction      Hip internal rotation      Hip external rotation      Knee flexion      Knee extension      Ankle dorsiflexion      Ankle plantarflexion      Ankle inversion      Ankle eversion       (Blank rows = not tested)     FUNCTIONAL TESTS:  5 times sit to stand: 16.47 Timed up and go (TUG): 20.61   GAIT: Distance walked: In clinic distances Assistive device utilized:  1 crutch Level of assistance: Complete Independence and Modified independence Comments: Walked in using a crutch. Gait without crutch slow, tentative.   TODAY'S TREATMENT:                                                                                                                              DATE:  01/30/22 NuStep L5 x 6 minutes. Supine bridge over physioball with pelvic tilt, repeat with hip IR, Repeat with ball roll side to side, 10 each Supine knee to chest while bridging over physioball, 8 reps Supine physioball crunches x 10 Sit to stand on Air ex pad with OHP of 4# ball at top of stand.  01/11/22 Education      PATIENT EDUCATION:  Education details: POC, HEP Person educated: Patient Education method: Explanation Education comprehension: verbalized understanding   HOME EXERCISE PROGRAM:   [id number]   ASSESSMENT:   CLINICAL IMPRESSION: Patient progressing well, has eliminated AD during gait, but she reports she walked a lot over the weekend and was sore the next day. Progressed her strengthening program as tolerated and discussed a progressive ambulation program, backing off from the distances over the weekend and progressing as tolerated. She does not have access to a pool until Jan, but does still plan to try water walking at that time. She reports that she is severely anemic and will be getting multiple blood transfusion in the next weeks. Held off on hip abd strengthening today.   OBJECTIVE IMPAIRMENTS: Abnormal gait, decreased activity tolerance, decreased balance, decreased coordination, decreased mobility, difficulty walking, decreased ROM, decreased strength, increased muscle spasms, impaired flexibility, postural dysfunction, and pain.    ACTIVITY LIMITATIONS: carrying, lifting, bending, standing, squatting, sleeping, and locomotion level   PARTICIPATION LIMITATIONS: meal prep, cleaning, laundry, shopping, community activity, and yard work   PERSONAL FACTORS: Past/current experiences are also affecting patient's functional outcome.    REHAB POTENTIAL: Good   CLINICAL DECISION MAKING: Stable/uncomplicated   EVALUATION COMPLEXITY:  Moderate     GOALS: Goals reviewed with patient? Yes   SHORT TERM GOALS: Target date: 02/08/22   I with basic HEP Baseline: Goal status: INITIAL   LONG TERM GOALS: Target date: 03/22/22   I with final HEP Baseline:  Goal status: INITIAL   2.  Increase FOTO score to at least 65 Baseline: 47 Goal status: INITIAL   3.  Decrease 5 x STS to < 12 sec Baseline: 16.47 Goal status: INITIAL   4.  Decrease TUG time to < 12 sec Baseline: 20.61 Goal status: INITIAL   5.  Patient will return to her normal daily activities and ambulation with no pain, normal gait pattern, no AD Baseline: pain up to 6/10, very slow and tentative gait,  limited in distance to < 200', with U crutch. Goal status: INITIAL   PLAN:   PT FREQUENCY: 1-2x/week   PT DURATION: 10 weeks   PLANNED INTERVENTIONS: Therapeutic exercises, Therapeutic activity, Neuromuscular re-education, Balance training, Gait training, Patient/Family education, Self Care, Joint mobilization, Dry Needling, Cryotherapy, Moist heat, Ionotophoresis 4mg /ml Dexamethasone, and Manual therapy.   PLAN FOR NEXT SESSION: Update HEP based upon her response to initial exercises, gait training, strength and mobility.     , DPT 01/11/2022, 12:04 PM       Communications  View All Conversations on this Encounter Media From this encounter Electronic signature on 01/11/2022 11:00 AM - 1 of 3 e-signatures recorded  Scan on 01/11/2022 11:01 AM by 01/13/2022: Hospital Billing Notice [facility]   Communication Routing History  None   No questionnaires available.            Linked Episodes  piriformis syndrome Noted 01/16/2021 Orders Placed  PT plan of care cert/re-cert Medication Changes   None Medication List Visit Diagnoses   Muscle weakness (generalized)  Difficulty in walking, not elsewhere classified  Other lack of coordination  Unsteadiness on feet  Acute hip pain, bilateral Problem List Encounter Status  Signed by 01/18/2021, PT on 01/11/22 at 12:06 Level of Service  All Charges for This Encounter  Code Description Service Date Service Provider Modifiers Qty  01/13/22 [facility] PHYSICAL THERAPY VISIT 01/11/2022 01/13/2022, PT  1  Iona Beard [facility] SELF CARE HOME M[facility]NT PT 01/11/2022 01/13/2022, PT GP 2

## 2022-02-01 ENCOUNTER — Inpatient Hospital Stay: Payer: Commercial Managed Care - HMO

## 2022-02-01 ENCOUNTER — Inpatient Hospital Stay: Payer: Commercial Managed Care - HMO | Attending: Hematology & Oncology

## 2022-02-01 VITALS — BP 122/95 | HR 75 | Temp 98.2°F | Resp 17

## 2022-02-01 DIAGNOSIS — D509 Iron deficiency anemia, unspecified: Secondary | ICD-10-CM

## 2022-02-01 DIAGNOSIS — D500 Iron deficiency anemia secondary to blood loss (chronic): Secondary | ICD-10-CM

## 2022-02-01 LAB — OCCULT BLOOD X 1 CARD TO LAB, STOOL
Fecal Occult Bld: NEGATIVE
Fecal Occult Bld: NEGATIVE
Fecal Occult Bld: NEGATIVE

## 2022-02-01 MED ORDER — SODIUM CHLORIDE 0.9 % IV SOLN
300.0000 mg | Freq: Once | INTRAVENOUS | Status: AC
  Administered 2022-02-01: 300 mg via INTRAVENOUS
  Filled 2022-02-01: qty 300

## 2022-02-01 MED ORDER — SODIUM CHLORIDE 0.9 % IV SOLN: Freq: Once | INTRAVENOUS | Status: AC

## 2022-02-01 NOTE — Patient Instructions (Signed)

## 2022-02-04 ENCOUNTER — Ambulatory Visit: Payer: Commercial Managed Care - HMO | Admitting: Physical Therapy

## 2022-02-06 ENCOUNTER — Ambulatory Visit: Payer: Commercial Managed Care - HMO | Admitting: Physical Therapy

## 2022-02-06 ENCOUNTER — Encounter: Payer: Self-pay | Admitting: Family

## 2022-02-07 ENCOUNTER — Encounter: Payer: Self-pay | Admitting: Family

## 2022-02-08 ENCOUNTER — Inpatient Hospital Stay: Payer: Commercial Managed Care - HMO

## 2022-02-08 VITALS — BP 138/87 | HR 79 | Temp 98.3°F | Resp 17

## 2022-02-08 DIAGNOSIS — D509 Iron deficiency anemia, unspecified: Secondary | ICD-10-CM | POA: Diagnosis not present

## 2022-02-08 MED ORDER — SODIUM CHLORIDE 0.9 % IV SOLN: Freq: Once | INTRAVENOUS | Status: AC

## 2022-02-08 MED ORDER — SODIUM CHLORIDE 0.9 % IV SOLN
300.0000 mg | Freq: Once | INTRAVENOUS | Status: AC
  Administered 2022-02-08: 300 mg via INTRAVENOUS
  Filled 2022-02-08: qty 300

## 2022-02-11 ENCOUNTER — Ambulatory Visit: Payer: Commercial Managed Care - HMO | Admitting: Physical Therapy

## 2022-02-21 ENCOUNTER — Inpatient Hospital Stay: Payer: Commercial Managed Care - HMO

## 2022-02-21 VITALS — BP 112/72 | HR 82 | Temp 98.0°F | Resp 16

## 2022-02-21 DIAGNOSIS — D509 Iron deficiency anemia, unspecified: Secondary | ICD-10-CM | POA: Diagnosis not present

## 2022-02-21 MED ORDER — SODIUM CHLORIDE 0.9 % IV SOLN
300.0000 mg | Freq: Once | INTRAVENOUS | Status: AC
  Administered 2022-02-21: 300 mg via INTRAVENOUS
  Filled 2022-02-21: qty 10

## 2022-02-21 MED ORDER — SODIUM CHLORIDE 0.9 % IV SOLN: Freq: Once | INTRAVENOUS | Status: AC

## 2022-02-21 NOTE — Patient Instructions (Signed)

## 2022-03-04 ENCOUNTER — Encounter: Payer: Self-pay | Admitting: Physician Assistant

## 2022-03-04 ENCOUNTER — Ambulatory Visit: Payer: Commercial Managed Care - HMO | Admitting: Physician Assistant

## 2022-03-04 VITALS — BP 120/90 | HR 92 | Ht 69.0 in | Wt 156.0 lb

## 2022-03-04 DIAGNOSIS — K219 Gastro-esophageal reflux disease without esophagitis: Secondary | ICD-10-CM

## 2022-03-04 DIAGNOSIS — D509 Iron deficiency anemia, unspecified: Secondary | ICD-10-CM | POA: Diagnosis not present

## 2022-03-04 DIAGNOSIS — R1310 Dysphagia, unspecified: Secondary | ICD-10-CM

## 2022-03-04 DIAGNOSIS — R1319 Other dysphagia: Secondary | ICD-10-CM

## 2022-03-04 MED ORDER — NA SULFATE-K SULFATE-MG SULF 17.5-3.13-1.6 GM/177ML PO SOLN: 1.0000 | ORAL | 0 refills | Status: DC

## 2022-03-04 MED ORDER — OMEPRAZOLE 40 MG PO CPDR: 40.0000 mg | DELAYED_RELEASE_CAPSULE | Freq: Two times a day (BID) | ORAL | 3 refills | Status: DC

## 2022-03-04 NOTE — Progress Notes (Signed)
Chief Complaint: IDA  HPI:    Roberta Sims is a  52 y/o Caucasian female with a past medical history as listed below including anxiety, GERD and osteoporosis, known to Dr. Leone Payor, who was referred to me by Gerre Scull, NP for a complaint of IDA.      03/04/2019 colonoscopy was completely normal.  Repeat recommended 10 years.  EGD on the same day with benign-appearing esophageal stenosis dilated, tortuous esophagus, 4 cm hiatal hernia and gastritis.    8 months ago CBC normal, 3 months ago hemoglobin 10.6, 2 months ago hemoglobin 10.0, most recently 11/29 hemoglobin 8.9.  MCV decreasing slowly over that time.  11/29 ferritin of 3 and iron panel with an iron low at 5, percent saturation low at 1.    01/23/2022 patient followed with hematology oncology in regards to her iron deficiency anemia and was arranged for 3 iron infusions.    Today, the patient tells me that she has always been somewhat fatigued but was more acutely so recently and was following with her PCP who was discussing her osteoporosis and possible treatments for that and decided to check her labs and found that her iron was terribly low.  He told her 6 months ago it had been completely normal.  She has not noticed any blood in her stool or anywhere else.  Does tell me her stools have been somewhat dark but this was after having surgery and being constipated for a time.  They are not black or sticky.  She has been experiencing occasional left lower quadrant pain which she thinks is from her ovary and is follow-up with gynecology in the next couple of weeks, but this seems to come and go and is better if she rubs in a circle on her abdomen.      She does tell me that she is not taking her Omeprazole 40 twice daily as prescribed because her mom was on this when she was diagnosed with colon cancer and she correlates the two.  She is having a lot of throat pain and some trouble with the eating just about anything even yogurt which feels like  it gets stuck on its way down and is then even more painful.  Also indigestion issues and epigastric pain/early satiety.    Denies fever, chills, weight loss, nausea or vomiting.        Past Medical History:  Diagnosis Date   Anxiety    Compression fracture of thoracic spine, non-traumatic, sequela    Dysphagia    GERD with stricture 03/04/2019   Hx of sessile serrated colonic polyp 06/28/2013   Hyperlipidemia    Osteoporosis    Scoliosis    Uterine prolapse     Past Surgical History:  Procedure Laterality Date   COLONOSCOPY     ESOPHAGOGASTRODUODENOSCOPY     harrington rod--scoliosis     PARTIAL HYSTERECTOMY      Current Outpatient Medications  Medication Sig Dispense Refill   Ascorbic Acid (VITAMIN C) 100 MG tablet Take 100 mg by mouth daily.     Cetirizine HCl (ZYRTEC PO) Take by mouth.     cyclobenzaprine (FLEXERIL) 10 MG tablet Take 1 tablet (10 mg total) by mouth 3 (three) times daily as needed for muscle spasms. 90 tablet 1   escitalopram (LEXAPRO) 10 MG tablet Take 1 tablet (10 mg total) by mouth daily. 90 tablet 3   omeprazole (PRILOSEC) 40 MG capsule TAKE ONE CAPSULE BY MOUTH ONE TIME DAILY BEFORE BREAKFAST  90 capsule 3   rosuvastatin (CRESTOR) 20 MG tablet Take 1 tablet (20 mg total) by mouth daily. 90 tablet 3   Vitamin D, Ergocalciferol, (DRISDOL) 1.25 MG (50000 UNIT) CAPS capsule Take 1 capsule (50,000 Units total) by mouth every 7 (seven) days. Take for 8 total doses(weeks) 8 capsule 0   zinc gluconate 50 MG tablet Take 50 mg by mouth daily.     zolpidem (AMBIEN) 5 MG tablet Take 1 tablet (5 mg total) by mouth at bedtime as needed for sleep. 30 tablet 0   No current facility-administered medications for this visit.    Allergies as of 03/04/2022 - Review Complete 03/04/2022  Allergen Reaction Noted   Meloxicam Other (See Comments) 11/23/2020    Family History  Problem Relation Age of Onset   Pancreatic cancer Mother        metastasized to colon   Alcohol  abuse Brother    Diabetes Brother    Cancer Maternal Aunt        breast   Colon cancer Maternal Grandmother     Social History   Socioeconomic History   Marital status: Significant Other    Spouse name: Not on file   Number of children: 2   Years of education: 13   Highest education level: Not on file  Occupational History   Occupation: self-employed    Fish farm manager: OTHER    Comment: Psychologist, prison and probation services  Tobacco Use   Smoking status: Never   Smokeless tobacco: Never  Vaping Use   Vaping Use: Never used  Substance and Sexual Activity   Alcohol use: Yes    Comment: socially   Drug use: Never   Sexual activity: Yes    Birth control/protection: Surgical  Other Topics Concern   Not on file  Social History Narrative   Divorced, 2 sons born 1990 1995.  They are in [place].   She is a self-employed Press photographer.   0-1 alcoholic beverages a day never smoker no drug use no other tobacco 1-2 caffeinated beverages weekly   Right handed   Social Determinants of Health   Financial Resource Strain: Not on file  Food Insecurity: Not on file  Transportation Needs: Not on file  Physical Activity: Not on file  Stress: Not on file  Social Connections: Not on file  Intimate Partner Violence: Not on file    Review of Systems:    Constitutional: No weight loss, fever or chills Skin: No rash  Cardiovascular: No chest pain Respiratory: No SOB  Gastrointestinal: See HPI and otherwise negative Genitourinary: No dysuria Neurological: No headache, dizziness or syncope Musculoskeletal: No new muscle or joint pain Hematologic: No bleeding Psychiatric: No history of depression or anxiety   Physical Exam:  Vital signs: BP (!) 120/90   Pulse 92   Ht 5\' 9"  (1.753 m)   Wt 156 lb (70.8 kg)   BMI 23.04 kg/m   Constitutional:   Pleasant Caucasian female appears to be in NAD, Well developed, Well nourished, alert and cooperative Head:  Normocephalic  and atraumatic. Eyes:   PEERL, EOMI. No icterus. Conjunctiva pink. Ears:  Normal auditory acuity. Neck:  Supple Throat: Oral cavity and pharynx without inflammation, swelling or lesion.  Respiratory: Respirations even and unlabored. Lungs clear to auscultation bilaterally.   No wheezes, crackles, or rhonchi.  Cardiovascular: Normal S1, S2. No MRG. Regular rate and rhythm. No peripheral edema, cyanosis or pallor.  Gastrointestinal:  Soft, nondistended, mild epigastric ttp. No rebound or guarding. Normal  bowel sounds. No appreciable masses or hepatomegaly. Rectal:  Not performed.  Msk:  Symmetrical without gross deformities. Without edema, no deformity or joint abnormality.  Neurologic:  Alert and  oriented x4;  grossly normal neurologically.  Skin:   Dry and intact without significant lesions or rashes. Psychiatric:  Demonstrates good judgement and reason without abnormal affect or behaviors.  RELEVANT LABS AND IMAGING: CBC    Component Value Date/Time   WBC 5.8 01/23/2022 1035   WBC 7.5 12/26/2021 0953   RBC 3.35 (L) 01/23/2022 1036   RBC 3.40 (L) 01/23/2022 1035   HGB 8.9 (L) 01/23/2022 1035   HGB 13.0 12/11/2020 1349   HCT 29.8 (L) 01/23/2022 1035   HCT 37.8 12/11/2020 1349   PLT 406 (H) 01/23/2022 1035   PLT 270 12/11/2020 1349   MCV 87.6 01/23/2022 1035   MCV 92 12/11/2020 1349   MCH 26.2 01/23/2022 1035   MCHC 29.9 (L) 01/23/2022 1035   RDW 15.0 01/23/2022 1035   RDW 13.0 12/11/2020 1349   LYMPHSABS 1.4 01/23/2022 1035   MONOABS 0.6 01/23/2022 1035   EOSABS 0.5 01/23/2022 1035   BASOSABS 0.1 01/23/2022 1035    CMP     Component Value Date/Time   NA 139 01/23/2022 1035   NA 139 12/11/2020 1349   K 4.0 01/23/2022 1035   CL 104 01/23/2022 1035   CO2 27 01/23/2022 1035   GLUCOSE 103 (H) 01/23/2022 1035   BUN 15 01/23/2022 1035   BUN 20 12/11/2020 1349   CREATININE 0.70 01/23/2022 1035   CALCIUM 9.5 01/23/2022 1035   PROT 7.2 01/23/2022 1035   PROT 7.1 12/11/2020  1349   ALBUMIN 4.6 01/23/2022 1035   ALBUMIN 4.7 12/11/2020 1349   AST 14 (L) 01/23/2022 1035   ALT 12 01/23/2022 1035   ALKPHOS 99 01/23/2022 1035   BILITOT 0.4 01/23/2022 1035   GFRNONAA >60 01/23/2022 1035    Assessment: 1.  IDA: New over the past 6 to 8 months, increase in upper GI symptoms; consider GI source versus other 2.  Dysphagia/GERD: Previous history of a stricture, currently not on Omeprazole; likely GERD +/- esophagitis +/- stricture +/- gastritis and PUD 3.  Left lower quadrant pain: Very occasional twinges that past when she rubs on her abdomen; likely gas  Plan: 1.  Discussed with patient I do not feel like the colonoscopy will be very high yield as this is completely normal 3 years ago now, but patient is anxious given her very occasional left lower quadrant pain and iron deficiency.  She is rather high both procedures together. 2.  Scheduled patient for diagnostic EGD and colonoscopy in the [facility] with Dr. Leone Payor given her IDA.  Did provide the patient a detailed list of risks of procedures and she agrees to proceed. Patient is appropriate for endoscopic procedure(s) in the ambulatory ([facility]) setting.  3.  Encouraged the patient to use her Omeprazole 40 mg twice daily, 30-60 minutes before breakfast and dinner.  We discussed the risk-benefit ratio of this medicine is definitely more beneficial for her.  There are absolutely no studies that have been done with a link between colon cancer and Omeprazole. 4.  Patient to follow in clinic per recommendations from Dr. Leone Payor after time of procedure.  In the meantime she will continue to follow with hematology oncology for labs and iron infusions as needed.  Roberta Meeker, PA-C [facility] 03/04/2022, 9:25 AM  Cc: Gerre Scull, NP

## 2022-03-04 NOTE — Patient Instructions (Addendum)
You have been scheduled for an endoscopy and colonoscopy. Please follow the written instructions given to you at your visit today. Please pick up your prep supplies at the pharmacy within the next 1-3 days. If you use inhalers (even only as needed), please bring them with you on the day of your procedure.   We have sent the following medications to your pharmacy for you to pick up at your convenience: Suprep, Omeprazole   Increase your Omeprazole to 40 mg - Twice daily.  Due to recent changes in healthcare laws, you may see the results of your imaging and laboratory studies on MyChart before your provider has had a chance to review them.  We understand that in some cases there may be results that are confusing or concerning to you. Not all laboratory results come back in the same time frame and the provider may be waiting for multiple results in order to interpret others.  Please give [place] 48 hours in order for your provider to thoroughly review all the results before contacting the office for clarification of your results.    Thank you for choosing me and [facility].  Ellouise Newer PA-C

## 2022-03-05 ENCOUNTER — Encounter: Payer: Self-pay | Admitting: Nurse Practitioner

## 2022-03-05 ENCOUNTER — Ambulatory Visit (INDEPENDENT_AMBULATORY_CARE_PROVIDER_SITE_OTHER): Payer: Commercial Managed Care - HMO | Admitting: Nurse Practitioner

## 2022-03-05 VITALS — BP 126/88 | HR 76 | Ht 69.0 in | Wt 157.0 lb

## 2022-03-05 DIAGNOSIS — G4733 Obstructive sleep apnea (adult) (pediatric): Secondary | ICD-10-CM | POA: Insufficient documentation

## 2022-03-05 DIAGNOSIS — K222 Esophageal obstruction: Secondary | ICD-10-CM

## 2022-03-05 DIAGNOSIS — J0100 Acute maxillary sinusitis, unspecified: Secondary | ICD-10-CM | POA: Diagnosis not present

## 2022-03-05 DIAGNOSIS — D509 Iron deficiency anemia, unspecified: Secondary | ICD-10-CM

## 2022-03-05 DIAGNOSIS — G4700 Insomnia, unspecified: Secondary | ICD-10-CM

## 2022-03-05 DIAGNOSIS — E7849 Other hyperlipidemia: Secondary | ICD-10-CM | POA: Diagnosis not present

## 2022-03-05 DIAGNOSIS — S32130D Nondisplaced Zone III fracture of sacrum, subsequent encounter for fracture with routine healing: Secondary | ICD-10-CM

## 2022-03-05 DIAGNOSIS — K219 Gastro-esophageal reflux disease without esophagitis: Secondary | ICD-10-CM | POA: Diagnosis not present

## 2022-03-05 DIAGNOSIS — M8000XD Age-related osteoporosis with current pathological fracture, unspecified site, subsequent encounter for fracture with routine healing: Secondary | ICD-10-CM

## 2022-03-05 MED ORDER — ZOLPIDEM TARTRATE 5 MG PO TABS: 5.0000 mg | ORAL_TABLET | Freq: Every evening | ORAL | 0 refills | Status: DC | PRN

## 2022-03-05 MED ORDER — AMOXICILLIN-POT CLAVULANATE 875-125 MG PO TABS: 1.0000 | ORAL_TABLET | Freq: Two times a day (BID) | ORAL | 0 refills | Status: DC

## 2022-03-05 NOTE — Assessment & Plan Note (Signed)
Continue ambien 5mg  prn sleep. PDMP reviewed. Refill sent to the pharmacy.

## 2022-03-05 NOTE — Assessment & Plan Note (Signed)
She is following with hematology and recently received 3 iron transfusions. She had negative hemoccult. She has appointment for EGD and colonoscopy. Continue collaboration and recommendations from GI and hematology.

## 2022-03-05 NOTE — Assessment & Plan Note (Signed)
She has not started the rosuvastatin yet as she is afraid of side effects and with her still healing from recent fractures and having iron infusions, she would like to wait until February or March to start the medication. Ok to wait until then to start the medication, she can also start COQ10 supplement daily with the rosuvastatin. Follow-up in 3 months.

## 2022-03-05 NOTE — Progress Notes (Signed)
Established Patient Office Visit  Subjective   Patient ID: Roberta Sims, female    DOB: Nov 18, 1970  Age: 52 y.o. MRN: [id number]  Chief Complaint  Patient presents with   Follow-up    3 follow up ,  discuss refill on medication,   having some dryness from using cpap machine not sure if sinus     HPI  [facility] is here to follow-up on sleep apnea, iron deficiency anemia, sacral fracture and osteoporosis. She also notes that she has been having sinus pain and congestion and believes she may have a sinus infection.   She has been following with hematology and has received 3 iron infusions. She has an appointment in a few days for repeat labs and follow-up with the specialist.   She was referred to GI for the acid reflux and iron deficiency. She is scheduled to have an endoscopy and colonoscopy in a few weeks. She has been prescribed omeprazole to take twice a day in the meantime.   She has been going to PT and is no longer needing crutches to walk after her sacral and pelvic fractures. She is still talking with the sports medicine provider about starting evenity for osteoporosis.   She has also noticed nasal congestion and sinus pain for the last week. She has been using her CPAP at night and does have it humidified. She gets sinus infections at times during the year. She denies cough, fever, ear pain, and sore throat. She has been taking zyrtec daily.     ROS See pertinent positives and negatives per HPI.    Objective:     BP 126/88   Pulse 76   Ht 5\' 9"  (1.753 m)   Wt 157 lb (71.2 kg)   SpO2 96%   BMI 23.18 kg/m    Physical Exam Vitals and nursing note reviewed.  Constitutional:      General: She is not in acute distress.    Appearance: Normal appearance.  HENT:     Head: Normocephalic.     Right Ear: Tympanic membrane, ear canal and external ear normal.     Left Ear: Tympanic membrane, ear canal and external ear normal.     Nose:     Right Sinus:  Maxillary sinus tenderness present. No frontal sinus tenderness.     Left Sinus: Maxillary sinus tenderness present. No frontal sinus tenderness.  Eyes:     Conjunctiva/sclera: Conjunctivae normal.  Cardiovascular:     Rate and Rhythm: Normal rate and regular rhythm.     Pulses: Normal pulses.     Heart sounds: Normal heart sounds.  Pulmonary:     Effort: Pulmonary effort is normal.     Breath sounds: Normal breath sounds.  Musculoskeletal:     Cervical back: Normal range of motion and neck supple. No tenderness.  Lymphadenopathy:     Cervical: No cervical adenopathy.  Skin:    General: Skin is warm.  Neurological:     General: No focal deficit present.     Mental Status: She is alert and oriented to person, place, and time.  Psychiatric:        Mood and Affect: Mood normal.        Behavior: Behavior normal.        Thought Content: Thought content normal.        Judgment: Judgment normal.     The 10-year ASCVD risk score (Arnett DK, et al., 2019) is: 2.7%    Assessment &  Plan:   Problem List Items Addressed This Visit       Respiratory   OSA (obstructive sleep apnea)     Digestive   GERD with stricture    Chronic, ongoing. She is currently following with GI and has planned EGD and colonoscopy. Continue prilosec as prescribed.         Musculoskeletal and Integument   Osteoporosis    She is following with sports medicine and trying to determine if evenity would be a good treatment for her. She has also been trying to walk 15 minutes a day and has started weight resistance exercises.       Closed nondisplaced zone III fracture of sacrum (HCC)    Pain is improving. Continue collaboration and recommendations from sports medicine.         Other   Essential familial hyperlipidemia    She has not started the rosuvastatin yet as she is afraid of side effects and with her still healing from recent fractures and having iron infusions, she would like to wait until  February or March to start the medication. Ok to wait until then to start the medication, she can also start COQ10 supplement daily with the rosuvastatin. Follow-up in 3 months.       Insomnia    Continue ambien 5mg  prn sleep. PDMP reviewed. Refill sent to the pharmacy.       Relevant Medications   zolpidem (AMBIEN) 5 MG tablet   IDA (iron deficiency anemia)    She is following with hematology and recently received 3 iron transfusions. She had negative hemoccult. She has appointment for EGD and colonoscopy. Continue collaboration and recommendations from GI and hematology.       Other Visit Diagnoses     Acute non-recurrent maxillary sinusitis    -  Primary   Start augmentin BID x10 days. Continue zyrtec daily. Encourage fluids. F/U if not improving.   Relevant Medications   amoxicillin-clavulanate (AUGMENTIN) 875-125 MG tablet       Return in about 3 months (around 06/04/2022) for follow-up.    Charyl Dancer, NP

## 2022-03-05 NOTE — Assessment & Plan Note (Signed)
She is following with sports medicine and trying to determine if evenity would be a good treatment for her. She has also been trying to walk 15 minutes a day and has started weight resistance exercises.

## 2022-03-05 NOTE — Assessment & Plan Note (Signed)
Pain is improving. Continue collaboration and recommendations from sports medicine.

## 2022-03-05 NOTE — Assessment & Plan Note (Signed)
Chronic, ongoing. She is currently following with GI and has planned EGD and colonoscopy. Continue prilosec as prescribed.

## 2022-03-05 NOTE — Patient Instructions (Signed)
It was great to see you!  Keep following with your specialists.   You can start the cholesterol pill in February or March.   Start augmentin twice a day for 10 days with food. Keep taking the zyrtec.   Let's follow-up in 3 months, sooner if you have concerns.  If a referral was placed today, you will be contacted for an appointment. Please note that routine referrals can sometimes take up to 3-4 weeks to process. Please call our office if you haven't heard anything after this time frame.  Take care,  Vance Peper, NP

## 2022-03-17 ENCOUNTER — Encounter: Payer: Self-pay | Admitting: Certified Registered Nurse Anesthetist

## 2022-03-21 ENCOUNTER — Ambulatory Visit (AMBULATORY_SURGERY_CENTER): Payer: Commercial Managed Care - HMO | Admitting: Internal Medicine

## 2022-03-21 ENCOUNTER — Encounter: Payer: Self-pay | Admitting: Internal Medicine

## 2022-03-21 VITALS — BP 143/77 | HR 67 | Temp 96.8°F | Resp 15 | Ht 69.0 in | Wt 156.0 lb

## 2022-03-21 DIAGNOSIS — D509 Iron deficiency anemia, unspecified: Secondary | ICD-10-CM

## 2022-03-21 DIAGNOSIS — R12 Heartburn: Secondary | ICD-10-CM

## 2022-03-21 DIAGNOSIS — R1310 Dysphagia, unspecified: Secondary | ICD-10-CM | POA: Diagnosis not present

## 2022-03-21 DIAGNOSIS — K449 Diaphragmatic hernia without obstruction or gangrene: Secondary | ICD-10-CM

## 2022-03-21 DIAGNOSIS — K219 Gastro-esophageal reflux disease without esophagitis: Secondary | ICD-10-CM

## 2022-03-21 DIAGNOSIS — K257 Chronic gastric ulcer without hemorrhage or perforation: Secondary | ICD-10-CM

## 2022-03-21 DIAGNOSIS — K649 Unspecified hemorrhoids: Secondary | ICD-10-CM

## 2022-03-21 DIAGNOSIS — K222 Esophageal obstruction: Secondary | ICD-10-CM

## 2022-03-21 DIAGNOSIS — Z86010 Personal history of colonic polyps: Secondary | ICD-10-CM

## 2022-03-21 MED ORDER — SODIUM CHLORIDE 0.9 % IV SOLN: 500.0000 mL | Freq: Once | INTRAVENOUS | Status: DC

## 2022-03-21 NOTE — Progress Notes (Signed)
History and Physical Interval Note:  03/21/2022 2:01 PM  [facility]  has presented today for endoscopic procedure(s), with the diagnosis of  Encounter Diagnoses  Name Primary?   Gastroesophageal reflux disease, unspecified whether esophagitis present Yes   Iron deficiency anemia, unspecified iron deficiency anemia type   .  The various methods of evaluation and treatment have been discussed with the patient and/or family. After consideration of risks, benefits and other options for treatment, the patient has consented to  the endoscopic procedure(s).   The patient's history has been reviewed, patient examined, no change in status, stable for endoscopic procedure(s).  I have reviewed the patient's chart and labs.  Questions were answered to the patient's satisfaction.     Gatha Mayer, MD, [facility]

## 2022-03-21 NOTE — Op Note (Signed)
[facility] Patient Name: Roberta Sims Procedure Date: 03/21/2022 2:10 PM MRN: [id number] Endoscopist: Gatha Mayer , MD, [id number] Age: 52 Referring MD:  Date of Birth: 1970/09/26 Gender: Female Account #: [id number] Procedure:                Upper GI endoscopy Indications:              Iron deficiency anemia, Dysphagia, Heartburn Medicines:                Monitored Anesthesia Care Procedure:                Pre-Anesthesia Assessment:                           - Prior to the procedure, a History and Physical                            was performed, and patient medications and                            allergies were reviewed. The patient's tolerance of                            previous anesthesia was also reviewed. The risks                            and benefits of the procedure and the sedation                            options and risks were discussed with the patient.                            All questions were answered, and informed consent                            was obtained. Prior Anticoagulants: The patient has                            taken no anticoagulant or antiplatelet agents. ASA                            Grade Assessment: II - A patient with mild systemic                            disease. After reviewing the risks and benefits,                            the patient was deemed in satisfactory condition to                            undergo the procedure.                           After obtaining informed consent, the endoscope was  passed under direct vision. Throughout the                            procedure, the patient's blood pressure, pulse, and                            oxygen saturations were monitored continuously. The                            Endoscope was introduced through the mouth, and                            advanced to the second part of duodenum. The upper                            GI  endoscopy was somewhat difficult due to the                            patient's oxygen desaturation. Successful                            completion of the procedure was aided by jaw thrust                            and transient mask-bag ventilation. The patient                            tolerated the procedure well. Scope In: Scope Out: Findings:                 The examined esophagus was moderately tortuous.                           One benign-appearing, intrinsic moderate                            (circumferential scarring or stenosis; an endoscope                            may pass) stenosis was found at the                            gastroesophageal junction. This stenosis measured                            less than one cm (in length). The stenosis was                            traversed. A TTS dilator was passed through the                            scope. Dilation with an 18-19-20 mm balloon dilator                            was  performed to 20 mm. The dilation site was                            examined and showed mild mucosal disruption.                            Estimated blood loss was minimal.                           The gastroesophageal flap valve was visualized                            endoscopically and classified as Hill Grade IV (no                            fold, wide open lumen, hiatal hernia present).                           A large hiatal hernia with a few Cameron ulcers was                            found. The hiatal narrowing was 50 cm from the                            incisors. The Z-line was 40 cm from the incisors.                           The examined duodenum was normal.                           The exam was otherwise without abnormality.                           The cardia and gastric fundus were otherwise normal                            on retroflexion. Complications:            No immediate complications. Estimated Blood Loss:      Estimated blood loss was minimal. Impression:               - Tortuous esophagus.                           - Benign-appearing esophageal stenosis. Dilated.                           - Gastroesophageal flap valve classified as Hill                            Grade IV (no fold, wide open lumen, hiatal hernia                            present).                           -  Large (sliding) hiatal hernia with a few Cameron                            ulcers. I believe these are cause of                            iron-deficiency anemia.                           - Normal examined duodenum.                           - The examination was otherwise normal.                           - No specimens collected. Recommendation:           - See the other procedure note for documentation of                            additional recommendations.                           - Clear liquids x 1 hour then soft foods rest of                            day. Start prior diet tomorrow.                           - Office will order upper GI series to assess                            hiatal hernia further - I think she should consider                            hiatal hernia repair Gatha Mayer, MD 03/21/2022 2:51:16 PM This report has been signed electronically.

## 2022-03-21 NOTE — Progress Notes (Signed)
1413  Pt experienced laryngeal spasm with jaw thrust and PPV performed. Nasopharyngeal airway size 6.0  placed without trauma. MD at bedside.

## 2022-03-21 NOTE — Progress Notes (Signed)
Late entry 1406  Robinul 0.1 mg IV given due large amount of secretions upon assessment.  MD made aware, vss

## 2022-03-21 NOTE — Progress Notes (Signed)
Report given to PACU, vss 

## 2022-03-21 NOTE — Progress Notes (Signed)
Pt's states no medical or surgical changes since previsit or office visit. VS assessed by D.T

## 2022-03-21 NOTE — Patient Instructions (Addendum)
Your hiatal hernia has enlarged and it is causing the low iron. The diaphragms squeeze the stomach and cause little ulcers that leak blood. I think you should consider having the hiatal hernia repaired. I also dilated the esophageal stricture again.  Am going to have you get a special xray called an upper GI series to get more information and will then call you. My nurse will call you to schedule this.   Having hiatal hernia repaired can fix the reflux and you could get off omeprazole also.  Colonoscopy showed hemorrhoids, no other issues.  During the upper endoscopy you had some airway spasm and we had to do a jaw thrust and use a mask and bag to help you breathe. This happens sometimes so do not stress over it. Your jaw could be sore, though. Acetaminophen or ibuprofen can be taken if needed.  I appreciate the opportunity to care for you. Gatha Mayer, MD, [facility]  Handouts provided on hiatal hernia, post-dilation diet and hemorrhoids.   YOU HAD AN ENDOSCOPIC PROCEDURE TODAY AT [facility]:   Refer to the procedure report that was given to you for any specific questions about what was found during the examination.  If the procedure report does not answer your questions, please call your gastroenterologist to clarify.  If you requested that your care partner not be given the details of your procedure findings, then the procedure report has been included in a sealed envelope for you to review at your convenience later.  YOU SHOULD EXPECT: Some feelings of bloating in the abdomen. Passage of more gas than usual.  Walking can help get rid of the air that was put into your GI tract during the procedure and reduce the bloating. If you had a lower endoscopy (such as a colonoscopy or flexible sigmoidoscopy) you may notice spotting of blood in your stool or on the toilet paper. If you underwent a bowel prep for your procedure, you may not have a normal bowel movement for a few  days.  Please Note:  You might notice some irritation and congestion in your nose or some drainage.  This is from the oxygen used during your procedure.  There is no need for concern and it should clear up in a day or so.  SYMPTOMS TO REPORT IMMEDIATELY:  Following lower endoscopy (colonoscopy or flexible sigmoidoscopy):  Excessive amounts of blood in the stool  Significant tenderness or worsening of abdominal pains  Swelling of the abdomen that is new, acute  Fever of 100F or higher  Following upper endoscopy (EGD)  Vomiting of blood or coffee ground material  New chest pain or pain under the shoulder blades  Painful or persistently difficult swallowing  New shortness of breath  Fever of 100F or higher  Black, tarry-looking stools  For urgent or emergent issues, a gastroenterologist can be reached at any hour by calling [phone]. Do not use MyChart messaging for urgent concerns.    DIET: Clear liquids for 1 hour (until 3:40pm). Then a Soft diet (see handout) for the rest of today. You may resume your regular diet tomorrow. Drink plenty of fluids but you should avoid alcoholic beverages for 24 hours.  ACTIVITY:  You should plan to take it easy for the rest of today and you should NOT DRIVE or use heavy machinery until tomorrow (because of the sedation medicines used during the test).    FOLLOW UP: Our staff will call the number listed on your records the  next business day following your procedure.  We will call around 7:15- 8:00 am to check on you and address any questions or concerns that you may have regarding the information given to you following your procedure. If we do not reach you, we will leave a message.     If any biopsies were taken you will be contacted by phone or by letter within the next 1-3 weeks.  Please call us at [phone] if you have not heard about the biopsies in 3 weeks.    SIGNATURES/CONFIDENTIALITY: You and/or your care partner have signed  paperwork which will be entered into your electronic medical record.  These signatures attest to the fact that that the information above on your After Visit Summary has been reviewed and is understood.  Full responsibility of the confidentiality of this discharge information lies with you and/or your care-partner.

## 2022-03-21 NOTE — Op Note (Signed)
[place] Patient Name: Roberta Sims Procedure Date: 03/21/2022 2:09 PM MRN: [id number] Endoscopist: Gatha Mayer , MD, [id number] Age: 52 Referring MD:  Date of Birth: 1971-02-10 Gender: Female Account #: [id number] Procedure:                Colonoscopy Indications:              Last colonoscopy: 2021, Iron deficiency anemia Medicines:                Monitored Anesthesia Care Procedure:                Pre-Anesthesia Assessment:                           - Prior to the procedure, a History and Physical                            was performed, and patient medications and                            allergies were reviewed. The patient's tolerance of                            previous anesthesia was also reviewed. The risks                            and benefits of the procedure and the sedation                            options and risks were discussed with the patient.                            All questions were answered, and informed consent                            was obtained. Prior Anticoagulants: The patient has                            taken no anticoagulant or antiplatelet agents. ASA                            Grade Assessment: II - A patient with mild systemic                            disease. After reviewing the risks and benefits,                            the patient was deemed in satisfactory condition to                            undergo the procedure.                           After obtaining informed consent, the colonoscope  was passed under direct vision. Throughout the                            procedure, the patient's blood pressure, pulse, and                            oxygen saturations were monitored continuously. The                            PCF-HQ190L Colonoscope was introduced through the                            anus and advanced to the the cecum, identified by                            appendiceal  orifice and ileocecal valve. The                            colonoscopy was performed without difficulty. The                            patient tolerated the procedure well. The quality                            of the bowel preparation was excellent. The bowel                            preparation used was SUPREP via split dose                            instruction. The ileocecal valve, appendiceal                            orifice, and rectum were photographed. Scope In: 2:22:46 PM Scope Out: 2:36:07 PM Scope Withdrawal Time: 0 hours 8 minutes 18 seconds  Total Procedure Duration: 0 hours 13 minutes 21 seconds  Findings:                 Hemorrhoids were found on perianal exam.                           The exam was otherwise without abnormality on                            direct and retroflexion views. Complications:            No immediate complications. Estimated Blood Loss:     Estimated blood loss: none. Impression:               - Hemorrhoids found on perianal exam.                           - The examination was otherwise normal on direct  and retroflexion views.                           - No specimens collected.                           - Personal history of colonic polyp - diminutive                            ssp removed 2015 Recommendation:           - Patient has a contact number available for                            emergencies. The signs and symptoms of potential                            delayed complications were discussed with the                            patient. Return to normal activities tomorrow.                            Written discharge instructions were provided to the                            patient.                           - Clear liquids x 1 hour then soft foods rest of                            day. Start prior diet tomorrow.                           - Repeat colonoscopy in 10 years. (Fhx CRCA in                             grandmother only, mother had pancreatic cancer with                            metastases to colon) Iva Boop, MD 03/21/2022 2:55:01 PM This report has been signed electronically.

## 2022-03-21 NOTE — Progress Notes (Signed)
Called to room to assist during endoscopic procedure.  Patient ID and intended procedure confirmed with present staff. Received instructions for my participation in the procedure from the performing physician.

## 2022-03-22 ENCOUNTER — Other Ambulatory Visit: Payer: Self-pay

## 2022-03-22 ENCOUNTER — Telehealth: Payer: Self-pay | Admitting: *Deleted

## 2022-03-22 ENCOUNTER — Telehealth: Payer: Self-pay

## 2022-03-22 DIAGNOSIS — K449 Diaphragmatic hernia without obstruction or gangrene: Secondary | ICD-10-CM

## 2022-03-22 NOTE — Telephone Encounter (Signed)
needs upper GI seried Received: Roberta Alamin, MD  [name], RN Please schedule upper GI series - not urgent  Re: hiatal hgernia

## 2022-03-22 NOTE — Telephone Encounter (Signed)
  Follow up Call-     03/21/2022    1:41 PM  Call back number  Post procedure Call Back phone  # [phone]  Permission to leave phone message Yes     Patient questions:  Do you have a fever, pain , or abdominal swelling? Yes.   Pain Score  3 * Jaw soreness but had jaw lift during the procedure. Have you tolerated food without any problems? Yes.    Have you been able to return to your normal activities? Yes.    Do you have any questions about your discharge instructions: Diet   No. Medications  No. Follow up visit  No.  Do you have questions or concerns about your Care? No.  Actions: * If pain score is 4 or above: No action needed, pain <4.

## 2022-03-22 NOTE — Telephone Encounter (Signed)
Pt made aware of Dr. Carlean Purl recommendations: Pt ordered and scheduled for a Upper GI series on 03/29/2022 at 10:00 AM at [facility]: Pt to arrive at 9:30 AM: Pt to have nothing to eat or drink past midnight. Pt made aware: Pt verbalized understanding with all questions answered.

## 2022-03-25 ENCOUNTER — Inpatient Hospital Stay (HOSPITAL_BASED_OUTPATIENT_CLINIC_OR_DEPARTMENT_OTHER): Payer: Commercial Managed Care - HMO | Admitting: Family

## 2022-03-25 ENCOUNTER — Inpatient Hospital Stay: Payer: Commercial Managed Care - HMO | Attending: Hematology & Oncology

## 2022-03-25 VITALS — BP 128/84 | HR 98 | Temp 98.1°F | Resp 19 | Ht 69.0 in | Wt 155.0 lb

## 2022-03-25 DIAGNOSIS — D509 Iron deficiency anemia, unspecified: Secondary | ICD-10-CM

## 2022-03-25 DIAGNOSIS — D500 Iron deficiency anemia secondary to blood loss (chronic): Secondary | ICD-10-CM

## 2022-03-25 LAB — CBC WITH DIFFERENTIAL (CANCER CENTER ONLY)
Abs Immature Granulocytes: 0.06 10*3/uL (ref 0.00–0.07)
Basophils Absolute: 0.1 10*3/uL (ref 0.0–0.1)
Basophils Relative: 1 %
Eosinophils Absolute: 0.8 10*3/uL — ABNORMAL HIGH (ref 0.0–0.5)
Eosinophils Relative: 13 %
HCT: 42.5 % (ref 36.0–46.0)
Hemoglobin: 13.5 g/dL (ref 12.0–15.0)
Immature Granulocytes: 1 %
Lymphocytes Relative: 30 %
Lymphs Abs: 1.8 10*3/uL (ref 0.7–4.0)
MCH: 29 pg (ref 26.0–34.0)
MCHC: 31.8 g/dL (ref 30.0–36.0)
MCV: 91.2 fL (ref 80.0–100.0)
Monocytes Absolute: 0.6 10*3/uL (ref 0.1–1.0)
Monocytes Relative: 10 %
Neutro Abs: 2.6 10*3/uL (ref 1.7–7.7)
Neutrophils Relative %: 45 %
Platelet Count: 233 10*3/uL (ref 150–400)
RBC: 4.66 MIL/uL (ref 3.87–5.11)
RDW: 20.8 % — ABNORMAL HIGH (ref 11.5–15.5)
WBC Count: 5.9 10*3/uL (ref 4.0–10.5)
nRBC: 0 % (ref 0.0–0.2)

## 2022-03-25 LAB — IRON AND IRON BINDING CAPACITY (CC-WL,HP ONLY)
Iron: 81 ug/dL (ref 28–170)
Saturation Ratios: 24 % (ref 10.4–31.8)
TIBC: 335 ug/dL (ref 250–450)
UIBC: 254 ug/dL (ref 148–442)

## 2022-03-25 LAB — FERRITIN: Ferritin: 42 ng/mL (ref 11–307)

## 2022-03-25 LAB — RETICULOCYTES
Immature Retic Fract: 7.9 % (ref 2.3–15.9)
RBC.: 4.67 MIL/uL (ref 3.87–5.11)
Retic Count, Absolute: 53.2 10*3/uL (ref 19.0–186.0)
Retic Ct Pct: 1.1 % (ref 0.4–3.1)

## 2022-03-25 NOTE — Progress Notes (Addendum)
Hematology and Oncology Follow Up Visit  Roberta Sims [id number] 23-Feb-1971 52 y.o. 03/25/2022   Principle Diagnosis:  Iron deficiency anemia    Current Therapy:   IV iron as indicated    Interim History:  Roberta Sims is here today for follow-up. She is doing well and has no complaints at this time.  She had an EGD and colonoscopy with Dr. Carlean Purl last week. EGD showed a tortuous esophagus with benign appearing esophageal stenosis which was dilated. Large hiatal hernia with a few Cameron ulcers. She has an appointment for an upper GI series later this week on 03/29/2022 and states that they will likely be moving forward with hernia repair. Colonoscopy showed hemorrhoids. She has past history of colonic polyps in 2015.  No obvious blood loss noted. No abnormal bruising or petechiae.  No fever, chills, n/v, cough, rash, dizziness, SOB, chest pain, palpitations, abdominal pain or changes in bowel or bladder habits.  No swelling or tenderness in her extremities.  She has numbness and tingling that comes and goes in her left arm and leg. She has history of scoliosis and had a left sided rod placed along the spine when she was a teenager. She will follow-up with her PCP if this becomes more frequent. Per patient, she has seen neuro surgery in the past.  No falls or syncope.  Appetite and hydration are good. Weight is stable 155 lbs.   ECOG Performance Status: 1 - Symptomatic but completely ambulatory  Medications:  Allergies as of 03/25/2022       Reactions   Meloxicam Other (See Comments)   Headache        Medication List        Accurate as of March 25, 2022 10:46 AM. If you have any questions, ask your nurse or doctor.          cyclobenzaprine 10 MG tablet Commonly known as: FLEXERIL Take 1 tablet (10 mg total) by mouth 3 (three) times daily as needed for muscle spasms.   escitalopram 10 MG tablet Commonly known as: Lexapro Take 1 tablet (10 mg total) by mouth daily.    NON FORMULARY Cpap machine nightly   omeprazole 40 MG capsule Commonly known as: PRILOSEC Take 1 capsule (40 mg total) by mouth 2 (two) times daily.   vitamin C 100 MG tablet Take 100 mg by mouth daily.   Vitamin D (Ergocalciferol) 1.25 MG (50000 UNIT) Caps capsule Commonly known as: DRISDOL Take 1 capsule (50,000 Units total) by mouth every 7 (seven) days. Take for 8 total doses(weeks)   zinc gluconate 50 MG tablet Take 50 mg by mouth daily.   zolpidem 5 MG tablet Commonly known as: AMBIEN Take 1 tablet (5 mg total) by mouth at bedtime as needed for sleep.   ZYRTEC PO Take by mouth.        Allergies:  Allergies  Allergen Reactions   Meloxicam Other (See Comments)    Headache    Past Medical History, Surgical history, Social history, and Family History were reviewed and updated.  Review of Systems: All other 10 point review of systems is negative.   Physical Exam:  height is 5\' 9"  (1.753 m) and weight is 155 lb (70.3 kg). Her oral temperature is 98.1 F (36.7 C). Her blood pressure is 128/84 and her pulse is 98. Her respiration is 19 and oxygen saturation is 98%.   Wt Readings from Last 3 Encounters:  03/25/22 155 lb (70.3 kg)  03/21/22 156 lb (70.8 kg)  03/05/22 157 lb (71.2 kg)    Ocular: Sclerae unicteric, pupils equal, round and reactive to light Ear-nose-throat: Oropharynx clear, dentition fair Lymphatic: No cervical or supraclavicular adenopathy Lungs no rales or rhonchi, good excursion bilaterally Heart regular rate and rhythm, no murmur appreciated Abd soft, nontender, positive bowel sounds MSK no focal spinal tenderness, no joint edema Neuro: non-focal, well-oriented, appropriate affect Breasts: Deferred   Lab Results  Component Value Date   WBC 5.9 03/25/2022   HGB 13.5 03/25/2022   HCT 42.5 03/25/2022   MCV 91.2 03/25/2022   PLT 233 03/25/2022   Lab Results  Component Value Date   FERRITIN 3 (L) 01/23/2022   IRON 5 (L) 01/23/2022    TIBC 510 (H) 01/23/2022   UIBC 505 (H) 01/23/2022   IRONPCTSAT 1 (L) 01/23/2022   Lab Results  Component Value Date   RETICCTPCT 1.1 03/25/2022   RBC 4.66 03/25/2022   RBC 4.67 03/25/2022   No results found for: "KPAFRELGTCHN", "LAMBDASER", "KAPLAMBRATIO" No results found for: "IGGSERUM", "IGA", "IGMSERUM" No results found for: "TOTALPROTELP", "ALBUMINELP", "A1GS", "A2GS", "BETS", "BETA2SER", "GAMS", "MSPIKE", "SPEI"   Chemistry      Component Value Date/Time   NA 139 01/23/2022 1035   NA 139 12/11/2020 1349   K 4.0 01/23/2022 1035   CL 104 01/23/2022 1035   CO2 27 01/23/2022 1035   BUN 15 01/23/2022 1035   BUN 20 12/11/2020 1349   CREATININE 0.70 01/23/2022 1035      Component Value Date/Time   CALCIUM 9.5 01/23/2022 1035   ALKPHOS 99 01/23/2022 1035   AST 14 (L) 01/23/2022 1035   ALT 12 01/23/2022 1035   BILITOT 0.4 01/23/2022 1035       Impression and Plan: Roberta Sims is a very pleasant 53 yo caucasian female with recent persistent iron deficiency anemia.  She is doing much better and has no complaints at this time.  Follow-up in 3 months.    Lottie Dawson, NP 1/29/202410:46 AM

## 2022-03-27 ENCOUNTER — Encounter: Payer: Self-pay | Admitting: Nurse Practitioner

## 2022-03-27 DIAGNOSIS — N951 Menopausal and female climacteric states: Secondary | ICD-10-CM

## 2022-03-29 ENCOUNTER — Ambulatory Visit (HOSPITAL_COMMUNITY)
Admission: RE | Admit: 2022-03-29 | Discharge: 2022-03-29 | Disposition: A | Discharge status: Home / Self Care | Payer: Commercial Managed Care - HMO | Source: Ambulatory Visit | Attending: Internal Medicine | Admitting: Internal Medicine

## 2022-03-29 ENCOUNTER — Other Ambulatory Visit: Payer: Self-pay | Admitting: Internal Medicine

## 2022-03-29 DIAGNOSIS — K449 Diaphragmatic hernia without obstruction or gangrene: Secondary | ICD-10-CM

## 2022-04-18 ENCOUNTER — Telehealth: Payer: Self-pay | Admitting: Physician Assistant

## 2022-04-18 NOTE — Telephone Encounter (Signed)
Shon, this patient last saw Dr. Carlean Purl. Remo Lipps, RN placed the referral, will route to him for follow up. Thanks

## 2022-04-18 NOTE — Telephone Encounter (Signed)
Patient returned call, states that [facility] had called her to schedule and she was able to call them back and schedule an appointment with them.

## 2022-04-18 NOTE — Telephone Encounter (Signed)
PT was told that if she had not heard from the surgical team to call us to find out scheduling. Please advise.
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
## 2022-04-25 ENCOUNTER — Encounter: Payer: Self-pay | Admitting: Neurology

## 2022-04-25 ENCOUNTER — Ambulatory Visit: Payer: Commercial Managed Care - HMO | Admitting: Neurology

## 2022-04-25 VITALS — BP 138/83 | HR 95 | Ht 69.0 in | Wt 161.0 lb

## 2022-04-25 DIAGNOSIS — G4733 Obstructive sleep apnea (adult) (pediatric): Secondary | ICD-10-CM | POA: Diagnosis not present

## 2022-04-25 NOTE — Patient Instructions (Signed)
Work on nightly use for > 4 hours, remembering to put your CPAP back on  Try to increase your humidity, try nasal spray for nasal dryness  I will pull a download in 4 weeks

## 2022-04-28 ENCOUNTER — Other Ambulatory Visit: Payer: Self-pay | Admitting: Family Medicine

## 2022-04-29 ENCOUNTER — Ambulatory Visit (INDEPENDENT_AMBULATORY_CARE_PROVIDER_SITE_OTHER): Payer: Commercial Managed Care - HMO | Admitting: Nurse Practitioner

## 2022-04-29 ENCOUNTER — Encounter: Payer: Self-pay | Admitting: Nurse Practitioner

## 2022-04-29 VITALS — HR 103 | Temp 98.5°F | Ht 69.0 in | Wt 159.0 lb

## 2022-04-29 DIAGNOSIS — J0100 Acute maxillary sinusitis, unspecified: Secondary | ICD-10-CM | POA: Diagnosis not present

## 2022-04-29 MED ORDER — PREDNISONE 10 MG PO TABS: ORAL_TABLET | ORAL | 0 refills | Status: DC

## 2022-04-29 MED ORDER — AMOXICILLIN-POT CLAVULANATE 875-125 MG PO TABS: 1.0000 | ORAL_TABLET | Freq: Two times a day (BID) | ORAL | 0 refills | Status: DC

## 2022-04-29 MED ORDER — FLUCONAZOLE 150 MG PO TABS: ORAL_TABLET | ORAL | 0 refills | Status: DC

## 2022-04-29 MED ORDER — PROMETHAZINE-DM 6.25-15 MG/5ML PO SYRP: 5.0000 mL | ORAL_SOLUTION | Freq: Four times a day (QID) | ORAL | 0 refills | Status: DC | PRN

## 2022-04-29 NOTE — Patient Instructions (Signed)
It was great to see you!  Start augmentin twice a day with food.   Start prednisone taper, 6 tablets today, 5 tomorrow, 4 the next, then keep decreasing by 1 every day until gone. Take this in the morning with food.   I have sent in cough syrup you can take every 4 hours as needed.  I sent in diflucan to take after finishing the antibiotic.   Let's follow-up if your symptoms worsen or don't improve.   Take care,  Vance Peper, NP

## 2022-04-29 NOTE — Progress Notes (Signed)
Acute Office Visit  Subjective:     Patient ID: Roberta Sims, female    DOB: 1970/09/17, 52 y.o.   MRN: [id number]  Chief Complaint  Patient presents with   Cough    Started on Friday cough with sore throat, neck pain on left side, left ear pain and headaches, sores on left side of tongue   HPI: Patient is in today for congestion and sore throat for the last week. She started having left sided neck pain on Thursday. On Friday and Saturday, she developed a cough. Home covid-19 test was negative.   UPPER RESPIRATORY TRACT INFECTION  Fever: no Cough: yes Shortness of breath: yes Wheezing: no Chest pain: no Chest tightness: no Chest congestion: no Nasal congestion: yes Runny nose: yes Post nasal drip:  a little Sneezing: no Sore throat: yes Swollen glands: yes - left Sinus pressure: yes Headache: yes Face pain: no Toothache: no Ear pain: yes left Ear pressure: yes left Eyes red/itching:no Eye drainage/crusting: no  Vomiting: no Rash: no Fatigue: yes Sick contacts: yes Strep contacts: no  Context: worse Recurrent sinusitis: no Relief with OTC cold/cough medications: no  Treatments attempted: advil, muscle relaxer, cold and flu   ROS See pertinent positives and negatives per HPI.     Objective:    Pulse (!) 103   Temp 98.5 F (36.9 C)   Ht '5\' 9"'$  (1.753 m)   Wt 159 lb (72.1 kg)   SpO2 96%   BMI 23.48 kg/m    Physical Exam Vitals and nursing note reviewed.  Constitutional:      General: She is not in acute distress.    Appearance: Normal appearance.  HENT:     Head: Normocephalic.     Right Ear: Tympanic membrane, ear canal and external ear normal.     Left Ear: Tympanic membrane, ear canal and external ear normal.     Mouth/Throat:     Pharynx: Posterior oropharyngeal erythema present. No oropharyngeal exudate.  Eyes:     Conjunctiva/sclera: Conjunctivae normal.  Cardiovascular:     Rate and Rhythm: Normal rate and regular rhythm.      Pulses: Normal pulses.     Heart sounds: Normal heart sounds.  Pulmonary:     Effort: Pulmonary effort is normal.     Breath sounds: Normal breath sounds.  Musculoskeletal:     Cervical back: Normal range of motion and neck supple. Tenderness present.  Lymphadenopathy:     Cervical: Cervical adenopathy present.  Skin:    General: Skin is warm.  Neurological:     General: No focal deficit present.     Mental Status: She is alert and oriented to person, place, and time.  Psychiatric:        Mood and Affect: Mood normal.        Behavior: Behavior normal.        Thought Content: Thought content normal.        Judgment: Judgment normal.       Assessment & Plan:   Problem List Items Addressed This Visit   None Visit Diagnoses     Acute non-recurrent maxillary sinusitis    -  Primary   Treat with augmentin BID x10 days and prednisone taper. Promethazine dm prn cough. continue otc meds and warm compresses to neck. F/U if not improving   Relevant Medications   amoxicillin-clavulanate (AUGMENTIN) 875-125 MG tablet   predniSONE (DELTASONE) 10 MG tablet   promethazine-dextromethorphan (PROMETHAZINE-DM) 6.25-15 MG/5ML syrup   fluconazole (  DIFLUCAN) 150 MG tablet       Meds ordered this encounter  Medications   amoxicillin-clavulanate (AUGMENTIN) 875-125 MG tablet    Sig: Take 1 tablet by mouth 2 (two) times daily.    Dispense:  20 tablet    Refill:  0   predniSONE (DELTASONE) 10 MG tablet    Sig: Take 6 tablets today, then 5 tablets tomorrow, then decrease by 1 tablet every day until gone    Dispense:  21 tablet    Refill:  0   promethazine-dextromethorphan (PROMETHAZINE-DM) 6.25-15 MG/5ML syrup    Sig: Take 5 mLs by mouth 4 (four) times daily as needed for cough.    Dispense:  118 mL    Refill:  0   fluconazole (DIFLUCAN) 150 MG tablet    Sig: Take 1 tablet after finishing antibiotic, then take a second one 3 days later if needed    Dispense:  2 tablet    Refill:  0     Return if symptoms worsen or fail to improve.  Charyl Dancer, NP

## 2022-05-06 ENCOUNTER — Ambulatory Visit (INDEPENDENT_AMBULATORY_CARE_PROVIDER_SITE_OTHER): Payer: Commercial Managed Care - HMO | Admitting: Nurse Practitioner

## 2022-05-06 ENCOUNTER — Encounter: Payer: Self-pay | Admitting: Family

## 2022-05-06 ENCOUNTER — Encounter: Payer: Self-pay | Admitting: Nurse Practitioner

## 2022-05-06 VITALS — BP 138/88 | HR 90 | Temp 98.1°F | Ht 69.0 in | Wt 158.6 lb

## 2022-05-06 DIAGNOSIS — J0140 Acute pansinusitis, unspecified: Secondary | ICD-10-CM | POA: Diagnosis not present

## 2022-05-06 MED ORDER — DOXYCYCLINE HYCLATE 100 MG PO TABS: 100.0000 mg | ORAL_TABLET | Freq: Two times a day (BID) | ORAL | 0 refills | Status: DC

## 2022-05-06 MED ORDER — PREDNISONE 10 MG PO TABS: ORAL_TABLET | ORAL | 0 refills | Status: DC

## 2022-05-06 NOTE — Assessment & Plan Note (Signed)
She is having ongoing sinus pain and tenderness on the left side of her face.  This is associated with eustachian tube dysfunction bilaterally.  Will have her start doxycycline 100 mg twice a day for 10 days and restart prednisone taper.  Continue doing sinus rinses and holding off on using CPAP at this time.  Encouraged her to reach out in the next 2 days if her symptoms do not improve and we will do a CT of her sinuses.

## 2022-05-06 NOTE — Patient Instructions (Signed)
It was great to see you!  Start doxycycline twice a day for you pain/infection.   Restart the prednisone taper. Take 6 tablets today and tomorrow, 5 tablets the next 2 days, then keep decreasing by 1 every other day until gone.   Please send a message if you are not getting any better by Wednesday, if not, I will order a CT scan.   Take care,  Vance Peper, NP

## 2022-05-06 NOTE — Progress Notes (Signed)
Acute Office Visit  Subjective:     Patient ID: Roberta Sims, female    DOB: 01/29/1971, 52 y.o.   MRN: [id number]  Chief Complaint  Patient presents with   Sinusitis    Follow up, feels left side of face, neck and ear is full of infection    HPI Patient is in today for ongoing left sinus pain, left ear pain and left neck pain.  She states that she has been taking the Augmentin and has finished the prednisone taper.  She was feeling a little bit better on day 3 and then she used her CPAP at night and feel like her symptoms regressed.  She denies fevers, sore throat.  Her cough has improved.  She just is concerned about the ongoing pain on the left side of her face and her left ear.  ROS See pertinent positives and negatives per HPI.     Objective:    BP 138/88 (BP Location: Right Arm)   Pulse 90   Temp 98.1 F (36.7 C)   Ht '5\' 9"'$  (1.753 m)   Wt 158 lb 9.6 oz (71.9 kg)   SpO2 94%   BMI 23.42 kg/m    Physical Exam Vitals and nursing note reviewed.  Constitutional:      General: She is not in acute distress.    Appearance: Normal appearance.  HENT:     Head: Normocephalic.     Right Ear: Ear canal and external ear normal. A middle ear effusion is present. Tympanic membrane is not injected.     Left Ear: Ear canal and external ear normal. A middle ear effusion is present. Tympanic membrane is not injected.     Nose:     Right Sinus: No maxillary sinus tenderness or frontal sinus tenderness.     Left Sinus: Maxillary sinus tenderness and frontal sinus tenderness present.  Eyes:     Conjunctiva/sclera: Conjunctivae normal.  Cardiovascular:     Rate and Rhythm: Normal rate and regular rhythm.     Pulses: Normal pulses.     Heart sounds: Normal heart sounds.  Pulmonary:     Effort: Pulmonary effort is normal.     Breath sounds: Normal breath sounds.  Musculoskeletal:     Cervical back: Normal range of motion and neck supple. Tenderness present.  Lymphadenopathy:      Cervical: No cervical adenopathy.  Skin:    General: Skin is warm.  Neurological:     General: No focal deficit present.     Mental Status: She is alert and oriented to person, place, and time.  Psychiatric:        Mood and Affect: Mood normal.        Behavior: Behavior normal.        Thought Content: Thought content normal.        Judgment: Judgment normal.       Assessment & Plan:   Problem List Items Addressed This Visit       Respiratory   Acute non-recurrent pansinusitis - Primary    She is having ongoing sinus pain and tenderness on the left side of her face.  This is associated with eustachian tube dysfunction bilaterally.  Will have her start doxycycline 100 mg twice a day for 10 days and restart prednisone taper.  Continue doing sinus rinses and holding off on using CPAP at this time.  Encouraged her to reach out in the next 2 days if her symptoms do not improve and we  will do a CT of her sinuses.      Relevant Medications   doxycycline (VIBRA-TABS) 100 MG tablet   predniSONE (DELTASONE) 10 MG tablet    Meds ordered this encounter  Medications   doxycycline (VIBRA-TABS) 100 MG tablet    Sig: Take 1 tablet (100 mg total) by mouth 2 (two) times daily.    Dispense:  20 tablet    Refill:  0   predniSONE (DELTASONE) 10 MG tablet    Sig: Take 6 tablets today and tomorrow, 5 tablets the next 2 days, then decrease by 1 tablet every other day until gone    Dispense:  42 tablet    Refill:  0    Return if symptoms worsen or fail to improve.  Charyl Dancer, NP

## 2022-05-09 ENCOUNTER — Ambulatory Visit (INDEPENDENT_AMBULATORY_CARE_PROVIDER_SITE_OTHER): Payer: Commercial Managed Care - HMO | Admitting: Family Medicine

## 2022-05-09 ENCOUNTER — Encounter: Payer: Self-pay | Admitting: Family Medicine

## 2022-05-09 VITALS — BP 122/74 | HR 70 | Temp 97.5°F | Wt 158.8 lb

## 2022-05-09 DIAGNOSIS — H65192 Other acute nonsuppurative otitis media, left ear: Secondary | ICD-10-CM | POA: Diagnosis not present

## 2022-05-09 DIAGNOSIS — M26622 Arthralgia of left temporomandibular joint: Secondary | ICD-10-CM | POA: Diagnosis not present

## 2022-05-09 DIAGNOSIS — J0140 Acute pansinusitis, unspecified: Secondary | ICD-10-CM

## 2022-05-09 DIAGNOSIS — H9202 Otalgia, left ear: Secondary | ICD-10-CM

## 2022-05-09 DIAGNOSIS — M26609 Unspecified temporomandibular joint disorder, unspecified side: Secondary | ICD-10-CM

## 2022-05-09 MED ORDER — TRAMADOL HCL 50 MG PO TABS: 50.0000 mg | ORAL_TABLET | Freq: Three times a day (TID) | ORAL | 0 refills | Status: AC | PRN

## 2022-05-09 NOTE — Patient Instructions (Signed)
Continue course of antibiotics and steroids.  We are also adding tramadol for pain.  We are urgently ordering a CT scanning of the head to look at your sinuses and jaw as well as referring to ENT.  If you develop severe symptoms of headache, nausea, blurry vision, fevers, chills or any other worrisome symptom, please go to the emergency department.

## 2022-05-09 NOTE — Assessment & Plan Note (Signed)
Roberta Sims is experiencing acute middle ear effusion on the left side which might be contributing to her ear and neck pain.  Differential diagnosis:  Otitis media with effusion Eustachian tube dysfunction TMJ syndrome exacerbation  Plan:  Continue current course of doxycycline and steroids as prescribed. Referral to an ENT specialist for further evaluation, including potential imaging such as a CT scan of the maxillofacial area to rule out any fluid accumulation or structural anomalies. Prescribe tramadol for pain management with caution given her current use of steroids. Caution patient about the risks of high-dose ibuprofen use while on steroids and advise limiting intake. Advise Roberta Sims to monitor symptoms closely and to seek emergency care if symptoms significantly worsen or if she experiences any neurologic changes.

## 2022-05-09 NOTE — Progress Notes (Signed)
Assessment/Plan:   Problem List Items Addressed This Visit       Nervous and Auditory   Acute MEE (middle ear effusion), left - Primary    Roberta Sims is experiencing acute middle ear effusion on the left side which might be contributing to her ear and neck pain.  Differential diagnosis:  Otitis media with effusion Eustachian tube dysfunction TMJ syndrome exacerbation  Plan:  Continue current course of doxycycline and steroids as prescribed. Referral to an ENT specialist for further evaluation, including potential imaging such as a CT scan of the maxillofacial area to rule out any fluid accumulation or structural anomalies. Prescribe tramadol for pain management with caution given her current use of steroids. Caution patient about the risks of high-dose ibuprofen use while on steroids and advise limiting intake. Advise [name] to monitor symptoms closely and to seek emergency care if symptoms significantly worsen or if she experiences any neurologic changes.      Relevant Medications   traMADol (ULTRAM) 50 MG tablet   Other Relevant Orders   Ambulatory referral to ENT   Other Visit Diagnoses     Left ear pain       Relevant Medications   traMADol (ULTRAM) 50 MG tablet   Other Relevant Orders   Ambulatory referral to ENT   Arthralgia of left temporomandibular joint       Relevant Medications   traMADol (ULTRAM) 50 MG tablet   Other Relevant Orders   Ambulatory referral to ENT   CT MAXILLOFACIAL WO CONTRAST   TMJ (temporomandibular joint disorder)       Relevant Medications   traMADol (ULTRAM) 50 MG tablet   Other Relevant Orders   Ambulatory referral to ENT       There are no discontinued medications.    Subjective:  HPI: Encounter date: 05/09/2022  Roberta Sims is a 52 y.o. female who has Anxiety; H/O scoliosis; Impaired fasting glucose; Exposure to hepatitis C; Menopausal symptoms; Pain of left side of body; Essential familial hyperlipidemia;  Hypertriglyceridemia, familial; Hx of colonic polyps; Esophageal dysphagia; GERD with stricture; Piriformis syndrome of left side; History of thoracic spinal fusion; Compression fracture of T12 vertebra (Roberta Sims); Compression fracture of L1 lumbar vertebra (Roberta Sims); Estrogen deficiency; Vitamin D deficiency; Acute bilateral thoracic back pain; Osteoporosis; Hyperlipidemia; Pelvic fracture (Roberta Sims); Non-restorative sleep; Snoring; Thoracogenic scoliosis of thoracic region; GERD with apnea; Nocturia; Closed nondisplaced zone III fracture of sacrum (Roberta Sims); Stress fracture of right femur; Insomnia; Multiple closed pelvic fractures without disruption of pelvic circle (Roberta Sims); Absolute anemia; IDA (iron deficiency anemia); OSA (obstructive sleep apnea); Acute non-recurrent pansinusitis; and Acute MEE (middle ear effusion), left on their problem list..   She  has a past medical history of Anxiety, Compression fracture of thoracic spine, non-traumatic, sequela, Dysphagia, GERD with stricture (03/04/2019), Hx of sessile serrated colonic polyp (06/28/2013), Hyperlipidemia, Osteoporosis, Scoliosis, and Uterine prolapse..   Encounter date: 05/09/2022  CHIEF COMPLAINT: Roberta Sims is experiencing ongoing left ear pain and tenderness on the left side of the neck lasting for approximately two and a half weeks. She reports pressure near the ear, sore throat without pain, and a feeling of being in a bubble on the affected side. She is currently on her second round of steroids and antibiotics with transient improvement followed by regression of symptoms.  HISTORY OF PRESENT ILLNESS:  Ear Pain and Neck Pain: Roberta Sims describes a persistent pain and pressure deep in her left ear and neck. She mentions current treatment including prednisonex2 amoxicillin and and doxycycline without  consistent relief. She feels temporary improvement with steroids, but symptoms regress. Roberta Sims denotes a sensation of fluid on her eardrum but no significant infection  noted on a recent examination. The neck pain is localized to the left posterior occipital region and the left mastoid region and sensitive to touch, and her intake of cyclobenzaprine, a muscle relaxant, has not provided relief.  TMJ discomfort.  Patient reports a significant history of TMJ discomfort requiring close following with dentistry in the past.  Patient relatively new to the area and has not yet establish with another dentist, however she has an upcoming appointment with 1.  Sleep Apnea: Roberta Sims has severe sleep apnea and is recently on CPAP therapy, which she hasn't used in the past couple of nights due to discomfort and past congestion. Her non-use of CPAP correlates with the worsening of symptoms, implying a potential link between her apnea management and her current symptoms.  ROS:  ENT: Fluid sensation on eardrum, no infection noted, no nasal discharge, sore throat without pain. Neurologic: Persistent headache, no disorientation, no nausea or vomiting. General: No fever, no chills, no rashes.  Past Surgical History:  Procedure Laterality Date   COLONOSCOPY     ESOPHAGOGASTRODUODENOSCOPY     harrington rod--scoliosis     PARTIAL HYSTERECTOMY      Outpatient Medications Prior to Visit  Medication Sig Dispense Refill   Ascorbic Acid (VITAMIN C) 100 MG tablet Take 100 mg by mouth daily.     Cetirizine HCl (ZYRTEC PO) Take by mouth.     cyclobenzaprine (FLEXERIL) 10 MG tablet Take 1 tablet (10 mg total) by mouth 3 (three) times daily as needed for muscle spasms. 90 tablet 1   doxycycline (VIBRA-TABS) 100 MG tablet Take 1 tablet (100 mg total) by mouth 2 (two) times daily. 20 tablet 0   escitalopram (LEXAPRO) 10 MG tablet Take 1 tablet (10 mg total) by mouth daily. 90 tablet 3   fluconazole (DIFLUCAN) 150 MG tablet Take 1 tablet after finishing antibiotic, then take a second one 3 days later if needed 2 tablet 0   NON FORMULARY Cpap machine nightly     omeprazole (PRILOSEC) 40 MG  capsule Take 1 capsule (40 mg total) by mouth 2 (two) times daily. 90 capsule 3   predniSONE (DELTASONE) 10 MG tablet Take 6 tablets today and tomorrow, 5 tablets the next 2 days, then decrease by 1 tablet every other day until gone 42 tablet 0   promethazine-dextromethorphan (PROMETHAZINE-DM) 6.25-15 MG/5ML syrup Take 5 mLs by mouth 4 (four) times daily as needed for cough. 118 mL 0   Vitamin D, Ergocalciferol, (DRISDOL) 1.25 MG (50000 UNIT) CAPS capsule Take 1 capsule (50,000 Units total) by mouth every 7 (seven) days. Take for 8 total doses(weeks) 8 capsule 0   zinc gluconate 50 MG tablet Take 50 mg by mouth daily.     zolpidem (AMBIEN) 5 MG tablet Take 1 tablet (5 mg total) by mouth at bedtime as needed for sleep. 30 tablet 0   No facility-administered medications prior to visit.    Family History  Problem Relation Age of Onset   Pancreatic cancer Mother        metastasized to colon   Alcohol abuse Brother    Diabetes Brother    Cancer Maternal Aunt        breast   Colon cancer Maternal Grandmother     Social History   Socioeconomic History   Marital status: Significant Other    Spouse name: Not  on file   Number of children: 2   Years of education: 13   Highest education level: Not on file  Occupational History   Occupation: self-employed    Employer: OTHER    Comment: Psychologist, prison and probation services  Tobacco Use   Smoking status: Never   Smokeless tobacco: Never  Vaping Use   Vaping Use: Never used  Substance and Sexual Activity   Alcohol use: Yes    Comment: socially   Drug use: Never   Sexual activity: Yes    Birth control/protection: Surgical  Other Topics Concern   Not on file  Social History Narrative   Divorced, 2 sons born 1990 1995.  They are in [place].   She is a self-employed Press photographer.   0-1 alcoholic beverages a day never smoker no drug use no other tobacco 1-2 caffeinated beverages weekly   Right handed   Social  Determinants of Health   Financial Resource Strain: Not on file  Food Insecurity: Not on file  Transportation Needs: Not on file  Physical Activity: Not on file  Stress: Not on file  Social Connections: Not on file  Intimate Partner Violence: Not on file                                                                                                 Objective:  Physical Exam: BP 122/74 (BP Location: Left Arm, Patient Position: Sitting, Cuff Size: Large)   Pulse 70   Temp (!) 97.5 F (36.4 C) (Temporal)   Wt 158 lb 12.8 oz (72 kg)   SpO2 97%   BMI 23.45 kg/m     Physical Exam Constitutional:      Appearance: Normal appearance.  HENT:     Head: Normocephalic and atraumatic.     Right Ear: Hearing and tympanic membrane normal.     Left Ear: Hearing normal. A middle ear effusion is present. There is mastoid tenderness.     Nose: Nose normal.     Mouth/Throat:     Lips: Pink.     Mouth: Mucous membranes are moist. No oral lesions.     Dentition: Normal dentition.     Tongue: No lesions.     Palate: No mass.     Pharynx: Oropharynx is clear.     Tonsils: No tonsillar exudate.  Eyes:     General: No scleral icterus.       Right eye: No discharge.        Left eye: No discharge.     Extraocular Movements: Extraocular movements intact.     Conjunctiva/sclera: Conjunctivae normal.     Pupils: Pupils are equal, round, and reactive to light.  Neck:   Cardiovascular:     Comments: No cyanosis, no JVD Pulmonary:     Effort: Pulmonary effort is normal.     Comments: No auditory wheezing Musculoskeletal:     Cervical back: Normal range of motion. Muscular tenderness present.     Comments: Normal Ambulation. No clubbing  Skin:    General: Skin is warm.     Findings: No rash.  Neurological:     General: No focal deficit present.     Mental Status: She is alert.     Cranial Nerves: Cranial nerves 2-12 are intact. No cranial nerve deficit.  Psychiatric:        Mood and  Affect: Mood normal.        Behavior: Behavior normal.        Thought Content: Thought content normal.        Judgment: Judgment normal.          Alesia Banda, MD, MS

## 2022-05-10 ENCOUNTER — Ambulatory Visit: Payer: Commercial Managed Care - HMO | Admitting: Family Medicine

## 2022-05-10 ENCOUNTER — Ambulatory Visit: Payer: Commercial Managed Care - HMO | Admitting: Nurse Practitioner

## 2022-05-10 ENCOUNTER — Telehealth: Payer: Self-pay | Admitting: Nurse Practitioner

## 2022-05-10 NOTE — Telephone Encounter (Signed)
Pt called to check status

## 2022-05-10 NOTE — Telephone Encounter (Signed)
Caller Name: Elcie Call back phone #: [phone]  Reason for Call:  Pt stated that the tramodol prescribed yesterday is causing some kind of reaction. She would like something else if possible before the weekend.

## 2022-05-10 NOTE — Addendum Note (Signed)
Addended by: Josephine Igo B on: 05/10/2022 11:59 AM   Modules accepted: Orders

## 2022-05-10 NOTE — Telephone Encounter (Signed)
Patient stated that she took the tramadol last night and it felt like her head was going to explore and intensified the symptoms . She has stopped the medication and inquired if there is any alternatives that could be prescribed?

## 2022-05-10 NOTE — Telephone Encounter (Signed)
Patient is aware of annotation below and verbalized understanding.  

## 2022-05-13 ENCOUNTER — Ambulatory Visit
Admission: RE | Admit: 2022-05-13 | Discharge: 2022-05-13 | Disposition: A | Discharge status: Home / Self Care | Payer: Commercial Managed Care - HMO | Source: Ambulatory Visit | Attending: Family Medicine | Admitting: Family Medicine

## 2022-05-13 DIAGNOSIS — M26622 Arthralgia of left temporomandibular joint: Secondary | ICD-10-CM

## 2022-05-14 MED ORDER — LEVOFLOXACIN 500 MG PO TABS: 500.0000 mg | ORAL_TABLET | Freq: Every day | ORAL | 0 refills | Status: AC

## 2022-05-14 NOTE — Addendum Note (Signed)
Addended by: Josephine Igo B on: 05/14/2022 08:24 AM   Modules accepted: Orders

## 2022-05-22 ENCOUNTER — Encounter: Payer: Self-pay | Admitting: Neurology

## 2022-05-23 ENCOUNTER — Ambulatory Visit: Payer: Commercial Managed Care - HMO | Admitting: Family Medicine

## 2022-05-23 ENCOUNTER — Inpatient Hospital Stay: Payer: Commercial Managed Care - HMO | Attending: Hematology & Oncology

## 2022-05-23 VITALS — BP 110/70 | Ht 69.0 in | Wt 148.0 lb

## 2022-05-23 DIAGNOSIS — S32040A Wedge compression fracture of fourth lumbar vertebra, initial encounter for closed fracture: Secondary | ICD-10-CM | POA: Diagnosis not present

## 2022-05-23 DIAGNOSIS — D509 Iron deficiency anemia, unspecified: Secondary | ICD-10-CM | POA: Insufficient documentation

## 2022-05-23 DIAGNOSIS — D500 Iron deficiency anemia secondary to blood loss (chronic): Secondary | ICD-10-CM

## 2022-05-23 DIAGNOSIS — S32512G Fracture of superior rim of left pubis, subsequent encounter for fracture with delayed healing: Secondary | ICD-10-CM

## 2022-05-23 DIAGNOSIS — M8000XG Age-related osteoporosis with current pathological fracture, unspecified site, subsequent encounter for fracture with delayed healing: Secondary | ICD-10-CM

## 2022-05-23 LAB — CBC WITH DIFFERENTIAL (CANCER CENTER ONLY)
Abs Immature Granulocytes: 0.06 10*3/uL (ref 0.00–0.07)
Basophils Absolute: 0.1 10*3/uL (ref 0.0–0.1)
Basophils Relative: 1 %
Eosinophils Absolute: 0.3 10*3/uL (ref 0.0–0.5)
Eosinophils Relative: 3 %
HCT: 42.4 % (ref 36.0–46.0)
Hemoglobin: 14.2 g/dL (ref 12.0–15.0)
Immature Granulocytes: 1 %
Lymphocytes Relative: 35 %
Lymphs Abs: 2.8 10*3/uL (ref 0.7–4.0)
MCH: 31 pg (ref 26.0–34.0)
MCHC: 33.5 g/dL (ref 30.0–36.0)
MCV: 92.6 fL (ref 80.0–100.0)
Monocytes Absolute: 0.9 10*3/uL (ref 0.1–1.0)
Monocytes Relative: 11 %
Neutro Abs: 3.9 10*3/uL (ref 1.7–7.7)
Neutrophils Relative %: 49 %
Platelet Count: 257 10*3/uL (ref 150–400)
RBC: 4.58 MIL/uL (ref 3.87–5.11)
RDW: 15.5 % (ref 11.5–15.5)
WBC Count: 8 10*3/uL (ref 4.0–10.5)
nRBC: 0 % (ref 0.0–0.2)

## 2022-05-23 LAB — IRON AND IRON BINDING CAPACITY (CC-WL,HP ONLY)
Iron: 107 ug/dL (ref 28–170)
Saturation Ratios: 34 % — ABNORMAL HIGH (ref 10.4–31.8)
TIBC: 312 ug/dL (ref 250–450)
UIBC: 205 ug/dL (ref 148–442)

## 2022-05-23 LAB — RETICULOCYTES
Immature Retic Fract: 8.6 % (ref 2.3–15.9)
RBC.: 4.63 MIL/uL (ref 3.87–5.11)
Retic Count, Absolute: 56.5 10*3/uL (ref 19.0–186.0)
Retic Ct Pct: 1.2 % (ref 0.4–3.1)

## 2022-05-23 LAB — FERRITIN: Ferritin: 40 ng/mL (ref 11–307)

## 2022-05-23 MED ORDER — HYDROCODONE-ACETAMINOPHEN 7.5-325 MG PO TABS: 1.0000 | ORAL_TABLET | Freq: Three times a day (TID) | ORAL | 0 refills | Status: DC | PRN

## 2022-05-23 MED ORDER — CALCITONIN (SALMON) 200 UNIT/ACT NA SOLN: 1.0000 | Freq: Every day | NASAL | 11 refills | Status: DC

## 2022-05-23 NOTE — Assessment & Plan Note (Signed)
Acute reoccurring.  Independent review of the CT lumbar spine that was performed in [place] this past weekend shows acute fracture of the endplate of the L4 vertebra.  She is having severe pain after the recent trauma this past weekend. -Counseled on home exercise therapy and supportive care. -Norco. -Counseled on back brace. -Calcitonin. -Counseled on rolling walker. -MRI of the lumbar spine to evaluate for acute compression fracture and for presurgical planning.

## 2022-05-23 NOTE — Assessment & Plan Note (Signed)
Acute on chronic in nature.  Previous bone density was demonstrating osteoporosis and now with a fourth fracture.  Fractures have delayed healing as well with the most recent CT scan of her pelvis. -Pursue Evenity given her number of fractures she is experienced as well as a hiatal hernia that she needs to have repaired for her iron deficiency anemia. -Vitamin D

## 2022-05-23 NOTE — Patient Instructions (Signed)
Good to see you Please try the back brace Please try the nasal spray  Please try a rolling walker  Please check with the hematology clinic to get the labs or we can order them.  We'll run your benefits for the eventiy  We'll get the MRi at [facility] imaging   Please send me a message in [place] with any questions or updates.  We'll setup a virtual visit once the MRi is resulted .   --Dr. Raeford Razor

## 2022-05-23 NOTE — Progress Notes (Signed)
  Roberta Sims - 52 y.o. female MRN [id number]  Date of birth: September 23, 1970  SUBJECTIVE:  Including CC & ROS.  No chief complaint on file.   Roberta Sims is a 52 y.o. female that is presenting with acute severe low back pain and pelvic pain.  She was catching her grandchild and felt severe pain in her lower back.  The pain was so severe that she had to call 911.  She was evaluated in the hospital with a CT scan of her pelvis, lumbar spine and thoracic spine.  The pelvic CT demonstrated an ongoing fracture of the left anterior pelvic rim.  It showed acutely an L4 compression fracture.  She is having severe pain in the lower back that radiates to the anterior aspect of her pelvis..    Review of Systems See HPI   HISTORY: Past Medical, Surgical, Social, and Family History Reviewed & Updated per EMR.   Pertinent Historical Findings include:  Past Medical History:  Diagnosis Date   Anxiety    Compression fracture of thoracic spine, non-traumatic, sequela    Dysphagia    GERD with stricture 03/04/2019   Hx of sessile serrated colonic polyp 06/28/2013   Hyperlipidemia    Osteoporosis    Scoliosis    Uterine prolapse     Past Surgical History:  Procedure Laterality Date   COLONOSCOPY     ESOPHAGOGASTRODUODENOSCOPY     harrington rod--scoliosis     PARTIAL HYSTERECTOMY       PHYSICAL EXAM:  VS: BP 110/70   Ht 5\' 9"  (1.753 m)   Wt 148 lb (67.1 kg)   BMI 21.86 kg/m  Physical Exam Gen: NAD, alert, cooperative with exam, well-appearing MSK:  Back/pelvis:  Unable to flex or extend the back. Weakness with resistance to hip flexion and extension. Tenderness to palpation on the anterior pelvic rim. Positive compression of the pelvic rim.  Neurovascularly intact       ASSESSMENT & PLAN:   Osteoporosis Acute on chronic in nature.  Previous bone density was demonstrating osteoporosis and now with a fourth fracture.  Fractures have delayed healing as well with the most  recent CT scan of her pelvis. -Pursue Evenity given her number of fractures she is experienced as well as a hiatal hernia that she needs to have repaired for her iron deficiency anemia. -Vitamin D  Pelvic fracture (HCC) Appears to be acutely occurring with the most recent CT scan that she had performed in [place].  She is having anterior pelvic pain as well.  She had a trauma that instigated her pain. -Counseled on supportive care. -MRI of the pelvis to evaluate for fracture and for presurgical planning.  IDA (iron deficiency anemia) Her ferritin has shown improvement.  Advised to check with hematology to get updated labs.  Closed compression fracture of fourth lumbar vertebra (HCC) Acute reoccurring.  Independent review of the CT lumbar spine that was performed in [place] this past weekend shows acute fracture of the endplate of the L4 vertebra.  She is having severe pain after the recent trauma this past weekend. -Counseled on home exercise therapy and supportive care. -Norco. -Counseled on back brace. -Calcitonin. -Counseled on rolling walker. -MRI of the lumbar spine to evaluate for acute compression fracture and for presurgical planning.

## 2022-05-23 NOTE — Assessment & Plan Note (Signed)
Appears to be acutely occurring with the most recent CT scan that she had performed in [place].  She is having anterior pelvic pain as well.  She had a trauma that instigated her pain. -Counseled on supportive care. -MRI of the pelvis to evaluate for fracture and for presurgical planning.

## 2022-05-23 NOTE — Assessment & Plan Note (Signed)
Her ferritin has shown improvement.  Advised to check with hematology to get updated labs.

## 2022-05-24 LAB — VITAMIN D 25 HYDROXY (VIT D DEFICIENCY, FRACTURES): Vit D, 25-Hydroxy: 18.7 ng/mL — ABNORMAL LOW (ref 30.0–100.0)

## 2022-05-27 ENCOUNTER — Telehealth: Payer: Self-pay | Admitting: Family Medicine

## 2022-05-27 MED ORDER — VITAMIN D (ERGOCALCIFEROL) 1.25 MG (50000 UNIT) PO CAPS: 50000.0000 [IU] | ORAL_CAPSULE | ORAL | 0 refills | Status: DC

## 2022-05-27 NOTE — Telephone Encounter (Signed)
Informed of results.   Rosemarie Ax, MD [facility] 05/27/2022, 9:12 AM

## 2022-05-28 ENCOUNTER — Encounter: Payer: Self-pay | Admitting: Family Medicine

## 2022-05-28 ENCOUNTER — Other Ambulatory Visit: Payer: Self-pay | Admitting: Family Medicine

## 2022-05-28 DIAGNOSIS — S32010A Wedge compression fracture of first lumbar vertebra, initial encounter for closed fracture: Secondary | ICD-10-CM

## 2022-05-28 DIAGNOSIS — S22080A Wedge compression fracture of T11-T12 vertebra, initial encounter for closed fracture: Secondary | ICD-10-CM

## 2022-05-28 MED ORDER — HYDROCODONE-ACETAMINOPHEN 7.5-325 MG PO TABS: 1.0000 | ORAL_TABLET | Freq: Three times a day (TID) | ORAL | 0 refills | Status: DC | PRN

## 2022-05-28 NOTE — Telephone Encounter (Signed)
From: Cresenciano Lick, CMA  Sent: 05/23/2022   9:38 AM EDT  To: Jasper Loser, CMA  Subject: Oneita Kras and patient would like to start Evenity process now. Thank you!

## 2022-05-28 NOTE — Telephone Encounter (Signed)
Prolia VOB initiated via [email or web address]  Last OV:  Next OV:  Last Prolia inj:  Next Prolia inj DUE:   

## 2022-05-29 MED ORDER — CYCLOBENZAPRINE HCL 10 MG PO TABS
10.0000 mg | ORAL_TABLET | Freq: Three times a day (TID) | ORAL | 0 refills | Status: DC | PRN
Start: 2022-05-29 — End: 2022-07-30

## 2022-05-29 NOTE — Addendum Note (Signed)
Addended by: Cresenciano Lick on: 05/29/2022 04:24 PM   Modules accepted: Orders

## 2022-06-03 NOTE — Telephone Encounter (Signed)
Prior auth required for EVENITY  PA PROCESS DETAILS: PA is required and is currently not on file. Please call Medical Review at [phone] to initiate PA

## 2022-06-03 NOTE — Telephone Encounter (Signed)
      CO-PAY ENROLLMENT 

## 2022-06-04 ENCOUNTER — Other Ambulatory Visit: Payer: Self-pay | Admitting: Family Medicine

## 2022-06-04 MED ORDER — HYDROCODONE-ACETAMINOPHEN 7.5-325 MG PO TABS: 1.0000 | ORAL_TABLET | Freq: Three times a day (TID) | ORAL | 0 refills | Status: DC | PRN

## 2022-06-04 NOTE — Telephone Encounter (Signed)
Refilled norco.   Myra Rude, MD [facility] 06/04/2022, 4:24 PM

## 2022-06-06 ENCOUNTER — Telehealth: Payer: Commercial Managed Care - HMO | Admitting: *Deleted

## 2022-06-06 NOTE — Telephone Encounter (Signed)
-----   Message from Carl Best sent at 06/06/2022 10:39 AM EDT ----- Regarding: [facility] just cld to say pt's Ins Denied MRI Joni Reining called from [facility] says they got notice that pt's Ins Co/ [employer] has denied the scheduled MRI for 06/09/22--Please advise if an Appeal will be done or if appt needs to be Cancelled or Rescheduled.  Call them @ [phone] [phone]  FYI

## 2022-06-07 NOTE — Telephone Encounter (Signed)
Joni Reining from [facility] is faxing over appeal/peer to peer info.

## 2022-06-09 ENCOUNTER — Other Ambulatory Visit: Payer: Commercial Managed Care - HMO

## 2022-06-10 ENCOUNTER — Encounter: Payer: Self-pay | Admitting: *Deleted

## 2022-06-11 ENCOUNTER — Other Ambulatory Visit: Payer: Commercial Managed Care - HMO

## 2022-06-12 NOTE — Telephone Encounter (Signed)
Peer to peer scheduled 06/18/22 @ 2:15. Lumbar MRI Case #: [id number] Pelvis MRI case #: [id number] Dr. Dionicia Abler will contact Dr. Jordan Likes at office for peer to peer. Patient is aware.   FYI: To fax additional clinical info: [phone].

## 2022-06-12 NOTE — Telephone Encounter (Signed)
VOB intiated via [employer] Prompt PA  EOC: [id number]

## 2022-06-21 ENCOUNTER — Other Ambulatory Visit: Payer: Self-pay | Admitting: Family

## 2022-06-21 DIAGNOSIS — D509 Iron deficiency anemia, unspecified: Secondary | ICD-10-CM

## 2022-06-24 ENCOUNTER — Encounter: Payer: Self-pay | Admitting: Family

## 2022-06-24 ENCOUNTER — Inpatient Hospital Stay: Payer: Commercial Managed Care - HMO | Attending: Hematology & Oncology

## 2022-06-24 ENCOUNTER — Inpatient Hospital Stay: Payer: Commercial Managed Care - HMO | Admitting: Family

## 2022-06-24 VITALS — BP 142/93 | HR 100 | Temp 98.7°F | Resp 18 | Wt 155.1 lb

## 2022-06-24 DIAGNOSIS — D509 Iron deficiency anemia, unspecified: Secondary | ICD-10-CM | POA: Diagnosis present

## 2022-06-24 DIAGNOSIS — D500 Iron deficiency anemia secondary to blood loss (chronic): Secondary | ICD-10-CM | POA: Diagnosis not present

## 2022-06-24 LAB — CBC WITH DIFFERENTIAL (CANCER CENTER ONLY)
Abs Immature Granulocytes: 0.03 10*3/uL (ref 0.00–0.07)
Basophils Absolute: 0.1 10*3/uL (ref 0.0–0.1)
Basophils Relative: 1 %
Eosinophils Absolute: 0.5 10*3/uL (ref 0.0–0.5)
Eosinophils Relative: 6 %
HCT: 41.3 % (ref 36.0–46.0)
Hemoglobin: 14.4 g/dL (ref 12.0–15.0)
Immature Granulocytes: 0 %
Lymphocytes Relative: 26 %
Lymphs Abs: 2.1 10*3/uL (ref 0.7–4.0)
MCH: 32.3 pg (ref 26.0–34.0)
MCHC: 34.9 g/dL (ref 30.0–36.0)
MCV: 92.6 fL (ref 80.0–100.0)
Monocytes Absolute: 0.8 10*3/uL (ref 0.1–1.0)
Monocytes Relative: 10 %
Neutro Abs: 4.6 10*3/uL (ref 1.7–7.7)
Neutrophils Relative %: 57 %
Platelet Count: 226 10*3/uL (ref 150–400)
RBC: 4.46 MIL/uL (ref 3.87–5.11)
RDW: 13.2 % (ref 11.5–15.5)
WBC Count: 8.2 10*3/uL (ref 4.0–10.5)
nRBC: 0 % (ref 0.0–0.2)

## 2022-06-24 LAB — RETICULOCYTES
Immature Retic Fract: 12.3 % (ref 2.3–15.9)
RBC.: 4.41 MIL/uL (ref 3.87–5.11)
Retic Count, Absolute: 83.3 10*3/uL (ref 19.0–186.0)
Retic Ct Pct: 1.9 % (ref 0.4–3.1)

## 2022-06-24 LAB — IRON AND IRON BINDING CAPACITY (CC-WL,HP ONLY)
Iron: 148 ug/dL (ref 28–170)
Saturation Ratios: 48 % — ABNORMAL HIGH (ref 10.4–31.8)
TIBC: 308 ug/dL (ref 250–450)
UIBC: 160 ug/dL (ref 148–442)

## 2022-06-24 LAB — FERRITIN: Ferritin: 31 ng/mL (ref 11–307)

## 2022-06-24 NOTE — Progress Notes (Signed)
Hematology and Oncology Follow Up Visit  Roberta Sims [id number] Aug 16, 1970 52 y.o. 06/24/2022   Principle Diagnosis:  Iron deficiency anemia     Current Therapy:        IV iron as indicated    Interim History:  Roberta Sims is here today for follow-up. She is is feeling good and has no new complaints at this time.  She has not noted any blood loss. No bruising or petechiae.  She has had further work up with GI at [facility] since we last saw her and has follow-up next week to discuss test results. She will then decided if she wants to proceed with hernia repair.  No fever, chills, n/v, cough, rash, dizziness, chest pain, abdominal pain or changes in bowel or bladder habits.  Occasional mild palpitations and SOB with over exertion/exercise. She is working to build her stamina and takes a break to rest when needed.  Numbness and tingling in the left arm waxes and wanes. She is looking into finding a neurosurgeon in the area to do further work up.  No falls or syncope reported.  Appetite and hydration are good. Weight is stable at 155 lbs.   ECOG Performance Status: 0 - Asymptomatic  Medications:  Allergies as of 06/24/2022       Reactions   Meloxicam Other (See Comments)   Headache   Tramadol Other (See Comments)   Patient states medication gave her a terrible headache        Medication List        Accurate as of June 24, 2022  1:10 PM. If you have any questions, ask your nurse or doctor.          calcitonin (salmon) 200 UNIT/ACT nasal spray Commonly known as: Miacalcin Place 1 spray into alternate nostrils daily.   cyclobenzaprine 10 MG tablet Commonly known as: FLEXERIL Take 1 tablet (10 mg total) by mouth 3 (three) times daily as needed for muscle spasms.   doxycycline 100 MG tablet Commonly known as: VIBRA-TABS Take 1 tablet (100 mg total) by mouth 2 (two) times daily.   escitalopram 10 MG tablet Commonly known as: Lexapro Take 1 tablet (10 mg total) by  mouth daily.   fluconazole 150 MG tablet Commonly known as: DIFLUCAN Take 1 tablet after finishing antibiotic, then take a second one 3 days later if needed   HYDROcodone-acetaminophen 7.5-325 MG tablet Commonly known as: NORCO Take 1 tablet by mouth every 8 (eight) hours as needed for moderate pain (cough).   NON FORMULARY Cpap machine nightly   omeprazole 40 MG capsule Commonly known as: PRILOSEC Take 1 capsule (40 mg total) by mouth 2 (two) times daily.   predniSONE 10 MG tablet Commonly known as: DELTASONE Take 6 tablets today and tomorrow, 5 tablets the next 2 days, then decrease by 1 tablet every other day until gone   promethazine-dextromethorphan 6.25-15 MG/5ML syrup Commonly known as: PROMETHAZINE-DM Take 5 mLs by mouth 4 (four) times daily as needed for cough.   vitamin C 100 MG tablet Take 100 mg by mouth daily.   Vitamin D (Ergocalciferol) 1.25 MG (50000 UNIT) Caps capsule Commonly known as: DRISDOL Take 1 capsule (50,000 Units total) by mouth every 7 (seven) days. Take for 8 total doses(weeks)   zinc gluconate 50 MG tablet Take 50 mg by mouth daily.   zolpidem 5 MG tablet Commonly known as: AMBIEN Take 1 tablet (5 mg total) by mouth at bedtime as needed for sleep.   ZYRTEC PO  Take by mouth.        Allergies:  Allergies  Allergen Reactions   Meloxicam Other (See Comments)    Headache   Tramadol Other (See Comments)    Patient states medication gave her a terrible headache    Past Medical History, Surgical history, Social history, and Family History were reviewed and updated.  Review of Systems: All other 10 point review of systems is negative.   Physical Exam:  vitals were not taken for this visit.   Wt Readings from Last 3 Encounters:  05/23/22 148 lb (67.1 kg)  05/09/22 158 lb 12.8 oz (72 kg)  05/06/22 158 lb 9.6 oz (71.9 kg)    Ocular: Sclerae unicteric, pupils equal, round and reactive to light Ear-nose-throat: Oropharynx clear,  dentition fair Lymphatic: No cervical or supraclavicular adenopathy Lungs no rales or rhonchi, good excursion bilaterally Heart regular rate and rhythm, no murmur appreciated Abd soft, nontender, positive bowel sounds MSK no focal spinal tenderness, no joint edema Neuro: non-focal, well-oriented, appropriate affect Breasts: Deferred  Lab Results  Component Value Date   WBC 8.0 05/23/2022   HGB 14.2 05/23/2022   HCT 42.4 05/23/2022   MCV 92.6 05/23/2022   PLT 257 05/23/2022   Lab Results  Component Value Date   FERRITIN 40 05/23/2022   IRON 107 05/23/2022   TIBC 312 05/23/2022   UIBC 205 05/23/2022   IRONPCTSAT 34 (H) 05/23/2022   Lab Results  Component Value Date   RETICCTPCT 1.2 05/23/2022   RBC 4.58 05/23/2022   RBC 4.63 05/23/2022   No results found for: "KPAFRELGTCHN", "LAMBDASER", "KAPLAMBRATIO" No results found for: "IGGSERUM", "IGA", "IGMSERUM" No results found for: "TOTALPROTELP", "ALBUMINELP", "A1GS", "A2GS", "BETS", "BETA2SER", "GAMS", "MSPIKE", "SPEI"   Chemistry      Component Value Date/Time   NA 139 01/23/2022 1035   NA 139 12/11/2020 1349   K 4.0 01/23/2022 1035   CL 104 01/23/2022 1035   CO2 27 01/23/2022 1035   BUN 15 01/23/2022 1035   BUN 20 12/11/2020 1349   CREATININE 0.70 01/23/2022 1035      Component Value Date/Time   CALCIUM 9.5 01/23/2022 1035   ALKPHOS 99 01/23/2022 1035   AST 14 (L) 01/23/2022 1035   ALT 12 01/23/2022 1035   BILITOT 0.4 01/23/2022 1035       Impression and Plan: Roberta Sims is a very pleasant 52 yo caucasian female with recent persistent iron deficiency anemia.  Iron studies are pending.  Follow-up in 6 months.    Eileen Stanford, NP 4/29/20241:10 PM

## 2022-06-28 NOTE — Telephone Encounter (Signed)
Rec'd fax from [facility] PA is approved 06/12/22 to 06/11/2023. Auth #: [id number].

## 2022-06-29 NOTE — Telephone Encounter (Signed)
APPROVED  PA# [id number]  Valid: 06/12/22-06/11/23

## 2022-06-29 NOTE — Telephone Encounter (Addendum)
Pt ready for scheduling on or after 06/12/22  Out-of-pocket cost due at time of visit: $563 ($25 if using [employer] Co-Pay Assistance Program)  Primary: [employer] H[employer] Evenity co-insurance: 25% Admin fee co-insurance: 0%  Deductible: does not apply  Prior Auth: APPROVED PA# [id number]  Valid: 06/12/22-06/11/23  Secondary: N/A Sheilah Mins co-insurance:  Admin fee co-insurance:  Deductible:  Prior Auth:  PA# Valid:    ** This summary of benefits is an estimation of the patient's out-of-pocket cost. Exact cost may vary based on individual plan coverage.    COPAY ENR[employer]LLMENT Co-Pay Member ID [id number]

## 2022-07-01 NOTE — Telephone Encounter (Signed)
Pt informed of below.  Nurse visit scheduled 07/02/22 at 2:00.

## 2022-07-02 ENCOUNTER — Ambulatory Visit: Payer: Commercial Managed Care - HMO | Admitting: *Deleted

## 2022-07-02 DIAGNOSIS — M8000XG Age-related osteoporosis with current pathological fracture, unspecified site, subsequent encounter for fracture with delayed healing: Secondary | ICD-10-CM | POA: Diagnosis not present

## 2022-07-02 MED ORDER — ROMOSOZUMAB-AQQG 105 MG/1.17ML ~~LOC~~ SOSY
210.0000 mg | PREFILLED_SYRINGE | Freq: Once | SUBCUTANEOUS | Status: AC
Start: 2022-07-02 — End: 2022-07-02
  Administered 2022-07-02: 210 mg via SUBCUTANEOUS

## 2022-07-02 NOTE — Progress Notes (Signed)
Patient is here for nurse visit for her 1st Evenity. Patient received bilateral  injections. She waited in the office for 15 minutes. She tolerated injections well. She will call us if needed and she will return in 1 month for her next Evenity injection.

## 2022-07-03 ENCOUNTER — Ambulatory Visit
Admission: RE | Admit: 2022-07-03 | Discharge: 2022-07-03 | Disposition: A | Discharge status: Home / Self Care | Payer: Commercial Managed Care - HMO | Source: Ambulatory Visit | Attending: Family Medicine | Admitting: Family Medicine

## 2022-07-03 DIAGNOSIS — S32040A Wedge compression fracture of fourth lumbar vertebra, initial encounter for closed fracture: Secondary | ICD-10-CM

## 2022-07-03 DIAGNOSIS — S32512G Fracture of superior rim of left pubis, subsequent encounter for fracture with delayed healing: Secondary | ICD-10-CM

## 2022-07-09 ENCOUNTER — Other Ambulatory Visit: Payer: Self-pay | Admitting: *Deleted

## 2022-07-09 DIAGNOSIS — S32512G Fracture of superior rim of left pubis, subsequent encounter for fracture with delayed healing: Secondary | ICD-10-CM

## 2022-07-15 ENCOUNTER — Encounter: Payer: Self-pay | Admitting: Family Medicine

## 2022-07-15 ENCOUNTER — Telehealth: Payer: Self-pay | Admitting: Family Medicine

## 2022-07-15 MED ORDER — HYDROCODONE-ACETAMINOPHEN 7.5-325 MG PO TABS: 1.0000 | ORAL_TABLET | Freq: Three times a day (TID) | ORAL | 0 refills | Status: DC | PRN

## 2022-07-15 NOTE — Telephone Encounter (Signed)
Informed of results. Provided norco. Has a referral to ortho for ongoing pubis fracture.  Myra Rude, MD [facility] 07/15/2022, 1:21 PM

## 2022-07-19 ENCOUNTER — Encounter: Payer: Self-pay | Admitting: Nurse Practitioner

## 2022-07-19 ENCOUNTER — Ambulatory Visit (INDEPENDENT_AMBULATORY_CARE_PROVIDER_SITE_OTHER): Payer: Commercial Managed Care - HMO | Admitting: Nurse Practitioner

## 2022-07-19 VITALS — BP 118/84 | HR 107 | Temp 101.6°F | Resp 16 | Ht 69.0 in | Wt 157.2 lb

## 2022-07-19 DIAGNOSIS — J069 Acute upper respiratory infection, unspecified: Secondary | ICD-10-CM | POA: Diagnosis not present

## 2022-07-19 DIAGNOSIS — J329 Chronic sinusitis, unspecified: Secondary | ICD-10-CM

## 2022-07-19 LAB — POC COVID19 BINAXNOW: SARS Coronavirus 2 Ag: NEGATIVE

## 2022-07-19 LAB — POCT INFLUENZA A/B
Influenza A, POC: NEGATIVE
Influenza B, POC: NEGATIVE

## 2022-07-19 MED ORDER — SALINE SPRAY 0.65 % NA SOLN
1.0000 | NASAL | 0 refills | Status: DC | PRN
Start: 2022-07-19 — End: 2022-10-25

## 2022-07-19 MED ORDER — FLUTICASONE PROPIONATE 50 MCG/ACT NA SUSP
2.0000 | Freq: Every day | NASAL | 0 refills | Status: DC
Start: 2022-07-19 — End: 2022-10-25

## 2022-07-19 MED ORDER — OXYMETAZOLINE HCL 0.05 % NA SOLN
1.0000 | Freq: Two times a day (BID) | NASAL | 0 refills | Status: DC
Start: 2022-07-19 — End: 2022-10-25

## 2022-07-19 MED ORDER — PROMETHAZINE-DM 6.25-15 MG/5ML PO SYRP
5.0000 mL | ORAL_SOLUTION | Freq: Four times a day (QID) | ORAL | 0 refills | Status: DC | PRN
Start: 2022-07-19 — End: 2022-10-25

## 2022-07-19 NOTE — Progress Notes (Signed)
Acute Office Visit  Subjective:    Patient ID: Roberta Sims, female    DOB: 04-05-1970, 52 y.o.   MRN: [id number]  Chief Complaint  Patient presents with   URI    Pt c/o Body aches, sob, sinus pain. Bilateral ear pain, cough and fever.    URI  This is a new problem. The current episode started in the past 7 days. The problem has been unchanged. The maximum temperature recorded prior to her arrival was 100.4 - 100.9 F. Associated symptoms include congestion, coughing, ear pain, headaches, joint pain, neck pain, rhinorrhea, sinus pain, a sore throat and swollen glands. Pertinent negatives include no abdominal pain, chest pain, diarrhea, dysuria, joint swelling, nausea, plugged ear sensation, rash, sneezing, vomiting or wheezing. She has tried decongestant and acetaminophen for the symptoms. The treatment provided mild relief.  Treated with 3 oral abx 2months ago due to persistent maxilla sinusitis. Symptoms resolved after use of levaquin. She thinks symptoms now might be related to recurrent bacterial sinusitis.  Outpatient Medications Prior to Visit  Medication Sig   Ascorbic Acid (VITAMIN C) 100 MG tablet Take 100 mg by mouth daily.   calcitonin, salmon, (MIACALCIN) 200 UNIT/ACT nasal spray Place 1 spray into alternate nostrils daily.   Cetirizine HCl (ZYRTEC PO) Take by mouth.   cyclobenzaprine (FLEXERIL) 10 MG tablet Take 1 tablet (10 mg total) by mouth 3 (three) times daily as needed for muscle spasms.   doxycycline (VIBRA-TABS) 100 MG tablet Take 1 tablet (100 mg total) by mouth 2 (two) times daily. (Patient not taking: Reported on 06/24/2022)   escitalopram (LEXAPRO) 10 MG tablet Take 1 tablet (10 mg total) by mouth daily.   fluconazole (DIFLUCAN) 150 MG tablet Take 1 tablet after finishing antibiotic, then take a second one 3 days later if needed (Patient not taking: Reported on 06/24/2022)   HYDROcodone-acetaminophen (NORCO) 7.5-325 MG tablet Take 1 tablet by mouth every 8  (eight) hours as needed for moderate pain (cough).   NON FORMULARY Cpap machine nightly   omeprazole (PRILOSEC) 40 MG capsule Take 1 capsule (40 mg total) by mouth 2 (two) times daily.   predniSONE (DELTASONE) 10 MG tablet Take 6 tablets today and tomorrow, 5 tablets the next 2 days, then decrease by 1 tablet every other day until gone (Patient not taking: Reported on 06/24/2022)   Vitamin D, Ergocalciferol, (DRISDOL) 1.25 MG (50000 UNIT) CAPS capsule Take 1 capsule (50,000 Units total) by mouth every 7 (seven) days. Take for 8 total doses(weeks) (Patient not taking: Reported on 06/24/2022)   zinc gluconate 50 MG tablet Take 50 mg by mouth daily.   zolpidem (AMBIEN) 5 MG tablet Take 1 tablet (5 mg total) by mouth at bedtime as needed for sleep.   [DISCONTINUED] promethazine-dextromethorphan (PROMETHAZINE-DM) 6.25-15 MG/5ML syrup Take 5 mLs by mouth 4 (four) times daily as needed for cough. (Patient not taking: Reported on 07/19/2022)   No facility-administered medications prior to visit.   Reviewed past medical and social history.  Review of Systems  HENT:  Positive for congestion, ear pain, rhinorrhea, sinus pain and sore throat. Negative for sneezing.   Respiratory:  Positive for cough. Negative for wheezing.   Cardiovascular:  Negative for chest pain.  Gastrointestinal:  Negative for abdominal pain, diarrhea, nausea and vomiting.  Genitourinary:  Negative for dysuria.  Musculoskeletal:  Positive for joint pain and neck pain.  Skin:  Negative for rash.  Neurological:  Positive for headaches.   Per HPI  Objective:    Physical Exam Constitutional:      General: She is not in acute distress. HENT:     Right Ear: Tympanic membrane, ear canal and external ear normal.     Left Ear: Tympanic membrane, ear canal and external ear normal.     Nose: No nasal tenderness, mucosal edema, congestion or rhinorrhea.     Right Nostril: No occlusion.     Left Nostril: No occlusion.     Right  Turbinates: Not enlarged, swollen or pale.     Left Turbinates: Not enlarged, swollen or pale.     Right Sinus: Maxillary sinus tenderness present. No frontal sinus tenderness.     Left Sinus: Maxillary sinus tenderness present. No frontal sinus tenderness.     Mouth/Throat:     Pharynx: Oropharynx is clear. Uvula midline. Posterior oropharyngeal erythema present. No oropharyngeal exudate or uvula swelling.     Tonsils: No tonsillar exudate or tonsillar abscesses.  Eyes:     Extraocular Movements: Extraocular movements intact.     Conjunctiva/sclera: Conjunctivae normal.  Cardiovascular:     Rate and Rhythm: Normal rate and regular rhythm.     Pulses: Normal pulses.     Heart sounds: Normal heart sounds.  Pulmonary:     Effort: Pulmonary effort is normal.     Breath sounds: Normal breath sounds.  Musculoskeletal:     Cervical back: Normal range of motion and neck supple.  Lymphadenopathy:     Cervical: No cervical adenopathy.  Neurological:     Mental Status: She is alert and oriented to person, place, and time.    BP 118/84 (BP Location: Left Arm, Patient Position: Sitting, Cuff Size: Large)   Pulse (!) 107   Temp (!) 101.6 F (38.7 C) (Oral)   Resp 16   Ht 5\' 9"  (1.753 m)   Wt 157 lb 3.2 oz (71.3 kg)   SpO2 98%   BMI 23.21 kg/m    Results for orders placed or performed in visit on 07/19/22  POC COVID-19  Result Value Ref Range   SARS Coronavirus 2 Ag Negative Negative  POCT Influenza A/B  Result Value Ref Range   Influenza A, POC Negative Negative   Influenza B, POC Negative Negative      Assessment & Plan:   Problem List Items Addressed This Visit   None Visit Diagnoses     Viral upper respiratory tract infection    -  Primary   Relevant Medications   sodium chloride (OCEAN) 0.65 % SOLN nasal spray   promethazine-dextromethorphan (PROMETHAZINE-DM) 6.25-15 MG/5ML syrup   fluticasone (FLONASE) 50 MCG/ACT nasal spray   oxymetazoline (AFRIN NASAL SPRAY) 0.05 %  nasal spray   Other Relevant Orders   POC COVID-19 (Completed)   POCT Influenza A/B (Completed)   Recurrent sinusitis       Relevant Medications   sodium chloride (OCEAN) 0.65 % SOLN nasal spray   promethazine-dextromethorphan (PROMETHAZINE-DM) 6.25-15 MG/5ML syrup   fluticasone (FLONASE) 50 MCG/ACT nasal spray   oxymetazoline (AFRIN NASAL SPRAY) 0.05 % nasal spray   Other Relevant Orders   DG Sinuses Complete      Meds ordered this encounter  Medications   sodium chloride (OCEAN) 0.65 % SOLN nasal spray    Sig: Place 1 spray into both nostrils as needed for congestion.    Dispense:  15 mL    Refill:  0    Order Specific Question:   Supervising Provider    Answer:   Nadene Rubins  ALFRED [[id number]]   promethazine-dextromethorphan (PROMETHAZINE-DM) 6.25-15 MG/5ML syrup    Sig: Take 5 mLs by mouth 4 (four) times daily as needed for cough.    Dispense:  180 mL    Refill:  0    Order Specific Question:   Supervising Provider    Answer:   Nadene Rubins ALFRED [[id number]]   fluticasone (FLONASE) 50 MCG/ACT nasal spray    Sig: Place 2 sprays into both nostrils daily.    Dispense:  16 g    Refill:  0    Order Specific Question:   Supervising Provider    Answer:   Nadene Rubins ALFRED [[id number]]   oxymetazoline (AFRIN NASAL SPRAY) 0.05 % nasal spray    Sig: Place 1 spray into both nostrils 2 (two) times daily. Use only for 3days, then stop    Dispense:  30 mL    Refill:  0    Order Specific Question:   Supervising Provider    Answer:   Mliss Sax [[id number]]    Return if symptoms worsen or fail to improve.  Alysia Penna, NP

## 2022-07-19 NOTE — Patient Instructions (Signed)
Go to [address] for sinus x-ray.  Flonase and Afrin use: apply 1spray of afrin in each nare, wait , then apply 2sprays of flonase in each nare. Use both nasal spray consecutively x 3days, then flonase only for at least 14days.  Encourage adequate oral hydration. Use over-the-counter  cold" medicine  such as Dayquil/nyquil for cough and congestion.  Use mucinex DM or Robitussin  or delsym for cough without congestion  You can use plain "Tylenol" or "Advil" for fever, chills and achyness. Use cool mist humidifier at bedtime to help with nasal congestion and cough.  Cold/cough medications may have tylenol or ibuprofen or guaifenesin or dextromethophan in them, so be careful not to take beyond the recommended dose for each of these medications.   "Common cold" symptoms are usually triggered by a virus.  The antibiotics are usually not necessary. On average, a" viral cold" illness may take 7-10 days to resolve. Please, make an appointment if you are not better or if you're worse.

## 2022-07-20 ENCOUNTER — Emergency Department (HOSPITAL_BASED_OUTPATIENT_CLINIC_OR_DEPARTMENT_OTHER)
Admission: EM | Admit: 2022-07-20 | Discharge: 2022-07-20 | Disposition: A | Discharge status: Home / Self Care | Payer: Commercial Managed Care - HMO | Attending: Emergency Medicine | Admitting: Emergency Medicine

## 2022-07-20 ENCOUNTER — Other Ambulatory Visit: Payer: Self-pay

## 2022-07-20 ENCOUNTER — Emergency Department (HOSPITAL_BASED_OUTPATIENT_CLINIC_OR_DEPARTMENT_OTHER): Payer: Commercial Managed Care - HMO

## 2022-07-20 ENCOUNTER — Encounter (HOSPITAL_BASED_OUTPATIENT_CLINIC_OR_DEPARTMENT_OTHER): Payer: Self-pay

## 2022-07-20 DIAGNOSIS — B09 Unspecified viral infection characterized by skin and mucous membrane lesions: Secondary | ICD-10-CM | POA: Insufficient documentation

## 2022-07-20 DIAGNOSIS — R509 Fever, unspecified: Secondary | ICD-10-CM | POA: Diagnosis present

## 2022-07-20 DIAGNOSIS — B349 Viral infection, unspecified: Secondary | ICD-10-CM | POA: Diagnosis not present

## 2022-07-20 DIAGNOSIS — R109 Unspecified abdominal pain: Secondary | ICD-10-CM | POA: Insufficient documentation

## 2022-07-20 DIAGNOSIS — Z1152 Encounter for screening for COVID-19: Secondary | ICD-10-CM | POA: Diagnosis not present

## 2022-07-20 DIAGNOSIS — E876 Hypokalemia: Secondary | ICD-10-CM | POA: Insufficient documentation

## 2022-07-20 DIAGNOSIS — R000 Tachycardia, unspecified: Secondary | ICD-10-CM | POA: Diagnosis not present

## 2022-07-20 LAB — URINALYSIS, ROUTINE W REFLEX MICROSCOPIC
Bilirubin Urine: NEGATIVE
Glucose, UA: NEGATIVE mg/dL
Ketones, ur: 15 mg/dL — AB
Leukocytes,Ua: NEGATIVE
Nitrite: NEGATIVE
Protein, ur: NEGATIVE mg/dL
Specific Gravity, Urine: 1.01 (ref 1.005–1.030)
pH: 7 (ref 5.0–8.0)

## 2022-07-20 LAB — CBC WITH DIFFERENTIAL/PLATELET
Abs Immature Granulocytes: 0.02 10*3/uL (ref 0.00–0.07)
Basophils Absolute: 0 10*3/uL (ref 0.0–0.1)
Basophils Relative: 1 %
Eosinophils Absolute: 0 10*3/uL (ref 0.0–0.5)
Eosinophils Relative: 0 %
HCT: 36.2 % (ref 36.0–46.0)
Hemoglobin: 12.9 g/dL (ref 12.0–15.0)
Immature Granulocytes: 0 %
Lymphocytes Relative: 11 %
Lymphs Abs: 0.6 10*3/uL — ABNORMAL LOW (ref 0.7–4.0)
MCH: 32.9 pg (ref 26.0–34.0)
MCHC: 35.6 g/dL (ref 30.0–36.0)
MCV: 92.3 fL (ref 80.0–100.0)
Monocytes Absolute: 0.2 10*3/uL (ref 0.1–1.0)
Monocytes Relative: 4 %
Neutro Abs: 5.1 10*3/uL (ref 1.7–7.7)
Neutrophils Relative %: 84 %
Platelets: 225 10*3/uL (ref 150–400)
RBC: 3.92 MIL/uL (ref 3.87–5.11)
RDW: 13.2 % (ref 11.5–15.5)
WBC: 6 10*3/uL (ref 4.0–10.5)
nRBC: 0 % (ref 0.0–0.2)

## 2022-07-20 LAB — HEPATIC FUNCTION PANEL
ALT: 47 U/L — ABNORMAL HIGH (ref 0–44)
AST: 56 U/L — ABNORMAL HIGH (ref 15–41)
Albumin: 3.5 g/dL (ref 3.5–5.0)
Alkaline Phosphatase: 82 U/L (ref 38–126)
Bilirubin, Direct: 0.2 mg/dL (ref 0.0–0.2)
Indirect Bilirubin: 0.7 mg/dL (ref 0.3–0.9)
Total Bilirubin: 0.9 mg/dL (ref 0.3–1.2)
Total Protein: 7.1 g/dL (ref 6.5–8.1)

## 2022-07-20 LAB — BASIC METABOLIC PANEL
Anion gap: 13 (ref 5–15)
BUN: 19 mg/dL (ref 6–20)
CO2: 21 mmol/L — ABNORMAL LOW (ref 22–32)
Calcium: 8.2 mg/dL — ABNORMAL LOW (ref 8.9–10.3)
Chloride: 97 mmol/L — ABNORMAL LOW (ref 98–111)
Creatinine, Ser: 0.85 mg/dL (ref 0.44–1.00)
GFR, Estimated: >60 mL/min (ref >60)
Glucose, Bld: 129 mg/dL — ABNORMAL HIGH (ref 70–99)
Potassium: 2.9 mmol/L — ABNORMAL LOW (ref 3.5–5.1)
Sodium: 131 mmol/L — ABNORMAL LOW (ref 135–145)

## 2022-07-20 LAB — URINALYSIS, MICROSCOPIC (REFLEX)

## 2022-07-20 LAB — RESP PANEL BY RT-PCR (RSV, FLU A&B, COVID)  RVPGX2
Influenza A by PCR: NEGATIVE
Influenza B by PCR: NEGATIVE
Resp Syncytial Virus by PCR: NEGATIVE
SARS Coronavirus 2 by RT PCR: NEGATIVE

## 2022-07-20 LAB — GROUP A STREP BY PCR: Group A Strep by PCR: NOT DETECTED

## 2022-07-20 LAB — LACTIC ACID, PLASMA: Lactic Acid, Venous: 0.9 mmol/L (ref 0.5–1.9)

## 2022-07-20 MED ORDER — IOHEXOL 300 MG/ML  SOLN
100.0000 mL | Freq: Once | INTRAMUSCULAR | Status: AC | PRN
  Administered 2022-07-20: 100 mL via INTRAVENOUS

## 2022-07-20 MED ORDER — SODIUM CHLORIDE 0.9 % IV BOLUS
1000.0000 mL | Freq: Once | INTRAVENOUS | Status: AC
  Administered 2022-07-20: 1000 mL via INTRAVENOUS

## 2022-07-20 MED ORDER — POTASSIUM CHLORIDE CRYS ER 20 MEQ PO TBCR: 20.0000 meq | EXTENDED_RELEASE_TABLET | Freq: Once | ORAL | 0 refills | Status: DC

## 2022-07-20 MED ORDER — SODIUM CHLORIDE 0.9 % IV BOLUS: 1000.0000 mL | Freq: Once | INTRAVENOUS | Status: DC

## 2022-07-20 MED ORDER — AMOXICILLIN-POT CLAVULANATE 875-125 MG PO TABS: 1.0000 | ORAL_TABLET | Freq: Two times a day (BID) | ORAL | 0 refills | Status: DC

## 2022-07-20 MED ORDER — POTASSIUM CHLORIDE CRYS ER 20 MEQ PO TBCR
40.0000 meq | EXTENDED_RELEASE_TABLET | Freq: Once | ORAL | Status: AC
  Administered 2022-07-20: 40 meq via ORAL
  Filled 2022-07-20: qty 2

## 2022-07-20 MED ORDER — ACETAMINOPHEN 500 MG PO TABS: 500.0000 mg | ORAL_TABLET | Freq: Four times a day (QID) | ORAL | 0 refills | Status: DC | PRN

## 2022-07-20 MED ORDER — ACETAMINOPHEN 325 MG PO TABS
650.0000 mg | ORAL_TABLET | Freq: Once | ORAL | Status: AC
  Administered 2022-07-20: 650 mg via ORAL
  Filled 2022-07-20: qty 2

## 2022-07-20 NOTE — ED Notes (Signed)
Lab notified to add-on HFP to previously collected blood  sample.

## 2022-07-20 NOTE — ED Triage Notes (Signed)
Patient having bil ear pain, congestion, sore throat and body aches for one week. She has had palpitations over the last few days.

## 2022-07-20 NOTE — ED Provider Notes (Signed)
[facility] Provider Note   CSN: [id number] Arrival date & time: 07/20/22  1629     History  Chief Complaint  Patient presents with   Generalized Body Aches   Palpitations    Roberta Sims is a 52 y.o. female.  The history is provided by the patient, the spouse and medical records. No language interpreter was used.  Palpitations    52 year old female significant history of anxiety, GERD, hyperlipidemia presenting today with cold symptoms.  Patient report for the past week she has been having fever, chills, body aches, headache, facial pain, abdominal pain, decreased appetite, not eating or drinking much.  She reports several weeks prior she had developed sinus infection requiring multiple rounds of antibiotics and steroids.  Symptoms got better but now is returning.  She reports having decrease in bowel movement and urinating less.  Today pt noticed a rash that appears throughout her body.  Rash is not itchy and nonpainful.  She mention she was seen by her doctor yesterday for her complaint without any specific diagnosis.  Earlier this month patient received an injection of a medication to help with bone health.  Patient denies any recent travel, sick contact, or any tick bite.  Home Medications Prior to Admission medications   Medication Sig Start Date End Date Taking? Authorizing Provider  Ascorbic Acid (VITAMIN C) 100 MG tablet Take 100 mg by mouth daily.    [provider]  calcitonin, salmon, (MIACALCIN) 200 UNIT/ACT nasal spray Place 1 spray into alternate nostrils daily. 05/23/22   Myra Rude, MD  Cetirizine HCl (ZYRTEC PO) Take by mouth.    [provider]  cyclobenzaprine (FLEXERIL) 10 MG tablet Take 1 tablet (10 mg total) by mouth 3 (three) times daily as needed for muscle spasms. 05/29/22   Myra Rude, MD  doxycycline (VIBRA-TABS) 100 MG tablet Take 1 tablet (100 mg total) by mouth 2 (two) times  daily. Patient not taking: Reported on 06/24/2022 05/06/22   McElwee, Leotis Shames A, NP  escitalopram (LEXAPRO) 10 MG tablet Take 1 tablet (10 mg total) by mouth daily. 06/29/21   Olive Bass, FNP  fluconazole (DIFLUCAN) 150 MG tablet Take 1 tablet after finishing antibiotic, then take a second one 3 days later if needed Patient not taking: Reported on 06/24/2022 04/29/22   McElwee, Jake Church, NP  fluticasone (FLONASE) 50 MCG/ACT nasal spray Place 2 sprays into both nostrils daily. 07/19/22   Nche, Bonna Gains, NP  HYDROcodone-acetaminophen (NORCO) 7.5-325 MG tablet Take 1 tablet by mouth every 8 (eight) hours as needed for moderate pain (cough). 07/15/22   Myra Rude, MD  NON FORMULARY Cpap machine nightly    [provider]  omeprazole (PRILOSEC) 40 MG capsule Take 1 capsule (40 mg total) by mouth 2 (two) times daily. 03/04/22   Unk Lightning, PA  oxymetazoline (AFRIN NASAL SPRAY) 0.05 % nasal spray Place 1 spray into both nostrils 2 (two) times daily. Use only for 3days, then stop 07/19/22   Nche, Bonna Gains, NP  predniSONE (DELTASONE) 10 MG tablet Take 6 tablets today and tomorrow, 5 tablets the next 2 days, then decrease by 1 tablet every other day until gone Patient not taking: Reported on 06/24/2022 05/06/22   McElwee, Jake Church, NP  promethazine-dextromethorphan (PROMETHAZINE-DM) 6.25-15 MG/5ML syrup Take 5 mLs by mouth 4 (four) times daily as needed for cough. 07/19/22   Nche, Bonna Gains, NP  sodium chloride (OCEAN) 0.65 % SOLN  nasal spray Place 1 spray into both nostrils as needed for congestion. 07/19/22   Nche, Bonna Gains, NP  Vitamin D, Ergocalciferol, (DRISDOL) 1.25 MG (50000 UNIT) CAPS capsule Take 1 capsule (50,000 Units total) by mouth every 7 (seven) days. Take for 8 total doses(weeks) Patient not taking: Reported on 06/24/2022 05/27/22   Myra Rude, MD  zinc gluconate 50 MG tablet Take 50 mg by mouth daily.    [provider]  zolpidem (AMBIEN)  5 MG tablet Take 1 tablet (5 mg total) by mouth at bedtime as needed for sleep. 03/05/22   McElwee, Jake Church, NP      Allergies    Meloxicam and Tramadol    Review of Systems   Review of Systems  Cardiovascular:  Positive for palpitations.  All other systems reviewed and are negative.   Physical Exam Updated Vital Signs BP 116/84 (BP Location: Left Arm)   Pulse (!) 121   Temp 99.5 F (37.5 C) (Oral)   Resp 18   Ht 5\' 9"  (1.753 m)   Wt 69.4 kg   SpO2 97%   BMI 22.59 kg/m  Physical Exam Vitals and nursing note reviewed.  Constitutional:      General: She is not in acute distress.    Appearance: She is well-developed.  HENT:     Head: Atraumatic.     Right Ear: Tympanic membrane normal.     Left Ear: Tympanic membrane normal.     Nose: Nose normal.     Mouth/Throat:     Mouth: Mucous membranes are moist.  Eyes:     Extraocular Movements: Extraocular movements intact.     Conjunctiva/sclera: Conjunctivae normal.     Pupils: Pupils are equal, round, and reactive to light.  Cardiovascular:     Rate and Rhythm: Tachycardia present.     Pulses: Normal pulses.     Heart sounds: Normal heart sounds.  Pulmonary:     Effort: Pulmonary effort is normal.     Breath sounds: No wheezing, rhonchi or rales.  Abdominal:     Palpations: Abdomen is soft.     Tenderness: There is abdominal tenderness (Diffuse tenderness no guarding or rebound tenderness no focal point tenderness).  Musculoskeletal:     Cervical back: Normal range of motion and neck supple. No rigidity or tenderness.  Skin:    Findings: No rash (Scattered blanchable erythematous rash noted throughout body).  Neurological:     Mental Status: She is alert.  Psychiatric:        Mood and Affect: Mood normal.     ED Results / Procedures / Treatments   Labs (all labs ordered are listed, but only abnormal results are displayed) Labs Reviewed  CBC WITH DIFFERENTIAL/PLATELET - Abnormal; Notable for the following  components:      Result Value   Lymphs Abs 0.6 (*)    All other components within normal limits  BASIC METABOLIC PANEL - Abnormal; Notable for the following components:   Sodium 131 (*)    Potassium 2.9 (*)    Chloride 97 (*)    CO2 21 (*)    Glucose, Bld 129 (*)    Calcium 8.2 (*)    All other components within normal limits  URINALYSIS, ROUTINE W REFLEX MICROSCOPIC - Abnormal; Notable for the following components:   APPearance CLOUDY (*)    Hgb urine dipstick TRACE (*)    Ketones, ur 15 (*)    All other components within normal limits  HEPATIC FUNCTION  PANEL - Abnormal; Notable for the following components:   AST 56 (*)    ALT 47 (*)    All other components within normal limits  URINALYSIS, MICROSCOPIC (REFLEX) - Abnormal; Notable for the following components:   Bacteria, UA FEW (*)    All other components within normal limits  RESP PANEL BY RT-PCR (RSV, FLU A&B, COVID)  [id number]  GROUP A STREP BY PCR  CULTURE, BLOOD (ROUTINE X 2)  CULTURE, BLOOD (ROUTINE X 2)  LACTIC ACID, PLASMA    EKG EKG Interpretation  Date/Time:  Saturday Jul 20 2022 16:49:27 EDT Ventricular Rate:  121 PR Interval:  138 QRS Duration: 104 QT Interval:  334 QTC Calculation: 474 R Axis:   -3 Text Interpretation: Sinus tachycardia Confirmed by Virgina Norfolk ([id number]) on 07/20/2022 4:53:39 PM  Radiology CT CHEST ABDOMEN PELVIS W CONTRAST  Result Date: 07/20/2022 CLINICAL DATA:  Sepsis abd pain Patient having bil ear pain, congestion, sore throat and body aches for one week. She has had palpitations over the last few days. EXAM: CT CHEST, ABDOMEN, AND PELVIS WITH CONTRAST TECHNIQUE: Multidetector CT imaging of the chest, abdomen and pelvis was performed following the standard protocol during bolus administration of intravenous contrast. RADIATION DOSE REDUCTION: This exam was performed according to the departmental dose-optimization program which includes automated exposure control, adjustment of the mA  and/or kV according to patient size and/or use of iterative reconstruction technique. CONTRAST:  OMNIPAQUE IOHEXOL 300 MG/ML  SOLN COMPARISON:  None Available. FINDINGS: CT CHEST FINDINGS Cardiovascular: Normal heart size. Trace simple pericardial effusion. The thoracic aorta is normal in caliber. No atherosclerotic plaque of the thoracic aorta. No coronary artery calcifications. The main pulmonary artery is normal in caliber. No central pulmonary embolus. Mediastinum/Nodes: No enlarged mediastinal, hilar, or axillary lymph nodes. Thyroid gland, trachea, and esophagus demonstrate no significant findings. Moderate volume hiatal hernia. Lungs/Pleura: Biapical pleural/pulmonary scarring. No focal consolidation. No pulmonary nodule. No pulmonary mass. No pleural effusion. No pneumothorax. Musculoskeletal: No chest wall abnormality. No suspicious lytic or blastic osseous lesions. No acute displaced fracture. Multilevel degenerative changes of the spine. Thoracolumbar scoliosis surgical hardware rod. CT ABDOMEN PELVIS FINDINGS Hepatobiliary: No focal liver abnormality. No gallstones, gallbladder wall thickening, or pericholecystic fluid. No biliary dilatation. Pancreas: No focal lesion. Normal pancreatic contour. No surrounding inflammatory changes. No main pancreatic ductal dilatation. Spleen: Normal in size without focal abnormality. Adrenals/Urinary Tract: No adrenal nodule bilaterally. Bilateral kidneys enhance symmetrically. No hydronephrosis. No hydroureter. The urinary bladder is unremarkable. Stomach/Bowel: Stomach is within normal limits. No evidence of bowel wall thickening or dilatation. Hypertrophic ileocecal valve. Fatty infiltration of the terminal ileum wall suggestive of chronic inflammatory changes. Appendix appears normal. Vascular/Lymphatic: No abdominal aorta or iliac aneurysm. Mild to moderate atherosclerotic plaque of the aorta and its branches. No abdominal, pelvic, or inguinal  lymphadenopathy. Reproductive: Status post hysterectomy. No adnexal masses. Other: No intraperitoneal free fluid. No intraperitoneal free gas. No organized fluid collection. Musculoskeletal: No abdominal wall hernia or abnormality. No suspicious lytic or blastic osseous lesions. No acute displaced fracture. Thoracolumbar scoliosis surgical hardware rod. Chronic appearing L4 inferior endplate compression fracture. Old healed left pubic symphysis fracture. Multilevel degenerative changes of the spine. IMPRESSION: 1. No acute intrathoracic, intra-abdominal, intrapelvic abnormality. 2. Other imaging findings of potential clinical significance: Trace pericardial effusion-may be physiologic. Moderate volume hiatal hernia. Aortic Atherosclerosis (ICD10-I70.0). Electronically Signed   By: Tish Frederickson M.D.   On: 07/20/2022 19:18   DG Chest Portable 1 View  Result Date: 07/20/2022  CLINICAL DATA:  Cough. EXAM: PORTABLE CHEST 1 VIEW COMPARISON:  Chest radiograph dated July 29, 2019 FINDINGS: The heart size and mediastinal contours are within normal limits. Both lungs are clear. Dextroscoliosis of the thoracic spine with rod fixation. IMPRESSION: No active disease. Electronically Signed   By: Larose Hires D.O.   On: 07/20/2022 17:32    Procedures Procedures    Medications Ordered in ED Medications  sodium chloride 0.9 % bolus 1,000 mL (0 mLs Intravenous Stopped 07/20/22 1824)  acetaminophen (TYLENOL) tablet 650 mg (650 mg Oral Given 07/20/22 1708)  sodium chloride 0.9 % bolus 1,000 mL (1,000 mLs Intravenous New Bag/Given 07/20/22 1857)  iohexol (OMNIPAQUE) 300 MG/ML solution 100 mL (100 mLs Intravenous Contrast Given 07/20/22 1842)  potassium chloride SA (KLOR-CON M) CR tablet 40 mEq (40 mEq Oral Given 07/20/22 1934)    ED Course/ Medical Decision Making/ A&P                             Medical Decision Making Amount and/or Complexity of Data Reviewed Labs: ordered. Radiology: ordered.  Risk OTC  drugs. Prescription drug management.   BP 112/76   Pulse (!) 112   Temp 99.5 F (37.5 C) (Oral)   Resp 20   Ht 5\' 9"  (1.753 m)   Wt 69.4 kg   SpO2 95%   BMI 22.59 kg/m   50:66 PM 52 year old female significant history of anxiety, GERD, hyperlipidemia presenting today with cold symptoms.  Patient report for the past week she has been having fever, chills, body aches, headache, facial pain, abdominal pain, decreased appetite, not eating or drinking much.  She reports several weeks prior she had developed sinus infection requiring multiple rounds of antibiotics and steroids.  Symptoms got better but now is returning.  She reports having decrease in bowel movement and urinating less.  Today pt noticed a rash that appears throughout her body.  Rash is not itchy and nonpainful.  She mention she was seen by her doctor yesterday for her complaint without any specific diagnosis.  Earlier this month patient received an injection of a medication to help with bone health.  Patient denies any recent travel, sick contact, or any tick bite.  On exam, patient is overall well-appearing, speaking in complete sentences with a rapid cadence.  She does not have any nuchal rigidity.  Ear nose and throat exam unremarkable no concerning lesions and no desquamation's of the skin.  She does not have any neck stiffness.  She is mildly tachycardic without murmur rubs or gallops, lungs otherwise clear to auscultation bilaterally abdomen is soft diffusely tender without focal point tenderness.  She is moving all 4 extremities without difficulty.  She does have scattered blanchable rash noted throughout body without any particular lesion, vesicular lesion or or pustular lesion.  Vital signs notable for an oral temperature of 99.5 and tachycardia with a heart rate of 121.  Her blood pressure is 116/84, no hypoxia.  Symptoms concerning for infectious etiology.  Rash of unknown etiology but could be viral.  -Labs ordered,  independently viewed and interpreted by me.  Labs remarkable for Na+ 131, IVF given.  K+ 2.9, supplementation given -The patient was maintained on a cardiac monitor.  I personally viewed and interpreted the cardiac monitored which showed an underlying rhythm of: NSR -Imaging independently viewed and interpreted by me and I agree with radiologist's interpretation.  Result remarkable for CT chest/abd/pelvis without acute finding.  Trace  pericardial effusion likely physiologic -This patient presents to the ED for concern of illness, this involves an extensive number of treatment options, and is a complaint that carries with it a high risk of complications and morbidity.  The differential diagnosis includes bacterial infection, viral infection, tick borne illness, Guillian Barre syndrome, sinusitis, meningitis -Co morbidities that complicate the patient evaluation includes anxiety, GERD -Treatment includes tylenol, IVF, potassium -Reevaluation of the patient after these medicines showed that the patient improved -PCP office notes or outside notes reviewed -Discussion with specialist attending DR. Curatolo -Escalation to admission/observation considered: patients feels much better, is comfortable with discharge, and will follow up with PCP -Prescription medication considered, patient comfortable with augmentin, tylenol, potassium -Social Determinant of Health considered  7:49 PM He had a a pretty extensive evaluation today.  No acute concerning finding were noted.  Suspect this is likely to be a viral illness with viral exanthem.  CT scan of chest abdomen pelvis without acute changes.  There is some physiologic edema to the area pericardium likely unrelated to her presentation.  Doubt pericarditis.  Normal WBC, normal lactic acid.  At this time, will discharge home with supportive care but given the prolonged duration of symptoms, I also offer Augmentin antibiotic to use as needed.  Outpatient follow-up  recommended.  Return precaution given.         Final Clinical Impression(s) / ED Diagnoses Final diagnoses:  Viral illness  Viral exanthem  Hypokalemia    Rx / DC Orders ED Discharge Orders          Ordered    amoxicillin-clavulanate (AUGMENTIN) 875-125 MG tablet  Every 12 hours        07/20/22 1953    acetaminophen (TYLENOL) 500 MG tablet  Every 6 hours PRN        07/20/22 1953    potassium chloride SA (KLOR-CON M) 20 MEQ tablet   Once        07/20/22 1953              Fayrene Helper, PA-C 07/20/22 1955    Curatolo, Adam, DO 07/20/22 2209

## 2022-07-20 NOTE — ED Notes (Signed)
D/c paperwork reviewed with pt, including prescriptions and follow up care.  All questions and/or concerns addressed at time of d/c.  No further needs expressed. . Pt verbalized understanding, Ambulatory with family to ED exit, NAD.   

## 2022-07-20 NOTE — ED Provider Notes (Signed)
Shared visit.  Patient here with fever and bodyaches for the last 3 days.  History of anxiety and cholesterol.  Viral sounding symptoms.  Very well-appearing.  Fever but otherwise normal vitals.  Was tachycardic upon arrival but now resolved.  She is got a very mild maculopapular rash that looks consistent to be a viral exanthem.  She had a fever for the last few days and has a very faint macular papular rash on her legs today.  She had extensive infectious workup that was unremarkable.  Negative strep test.  CT scan of chest abdomen pelvis unremarkable.  Radiology says likely trace pericardial effusion likely physiologic.  She has no symptoms suggest pericarditis.  EKG shows sinus rhythm.  She overall has no source for infection but she has no white count no lactic acidosis.  I do think that this is a viral process.  She has somewhat sounding chronic abdominal pain from a hiatal hernia.  CT scan showed no evidence of pneumonia or other intra-abdominal infectious process.  Sounds like most of her symptoms are sinus related, throat related.  Strep test is negative.  Ears are little bit red.  Will put her on a course of antibiotics for sinusitis although I do suspect that this is a viral exanthem/viral process.  I have no concern for bacteremia or other acute process.  She is not on any immunosuppressants.  Discharged in good condition.  Understands return precautions.  This chart was dictated using voice recognition software.  Despite best efforts to proofread,  errors can occur which can change the documentation meaning.    Virgina Norfolk, DO 07/20/22 1944

## 2022-07-20 NOTE — Discharge Instructions (Signed)
You have been evaluated for your symptoms.  You are experiencing a viral infection.  Your rash is likely to be a viral rash and will resolve over time.  Your sinus pain may be due to residual sinusitis, take Augmentin as prescribed.  Take Tylenol as needed for fever and bodyaches.  Your potassium level is low today, take potassium supplementation and follow-up closely with your doctor for recheck.  Return if you have any concern.

## 2022-07-21 LAB — CULTURE, BLOOD (ROUTINE X 2)

## 2022-07-22 LAB — CULTURE, BLOOD (ROUTINE X 2): Culture: NO GROWTH

## 2022-07-23 ENCOUNTER — Encounter: Payer: Commercial Managed Care - HMO | Admitting: Family Medicine

## 2022-07-23 LAB — CULTURE, BLOOD (ROUTINE X 2)

## 2022-07-24 ENCOUNTER — Ambulatory Visit (INDEPENDENT_AMBULATORY_CARE_PROVIDER_SITE_OTHER)
Admission: RE | Admit: 2022-07-24 | Discharge: 2022-07-24 | Disposition: A | Discharge status: Home / Self Care | Payer: Commercial Managed Care - HMO | Source: Ambulatory Visit | Attending: Nurse Practitioner | Admitting: Nurse Practitioner

## 2022-07-24 DIAGNOSIS — J329 Chronic sinusitis, unspecified: Secondary | ICD-10-CM | POA: Diagnosis not present

## 2022-07-24 LAB — CULTURE, BLOOD (ROUTINE X 2): Culture: NO GROWTH

## 2022-07-27 ENCOUNTER — Other Ambulatory Visit: Payer: Self-pay | Admitting: Family

## 2022-07-27 DIAGNOSIS — F419 Anxiety disorder, unspecified: Secondary | ICD-10-CM

## 2022-07-30 ENCOUNTER — Other Ambulatory Visit: Payer: Self-pay | Admitting: *Deleted

## 2022-07-30 DIAGNOSIS — S22080A Wedge compression fracture of T11-T12 vertebra, initial encounter for closed fracture: Secondary | ICD-10-CM

## 2022-07-30 DIAGNOSIS — S32010A Wedge compression fracture of first lumbar vertebra, initial encounter for closed fracture: Secondary | ICD-10-CM

## 2022-07-30 MED ORDER — CYCLOBENZAPRINE HCL 10 MG PO TABS
10.0000 mg | ORAL_TABLET | Freq: Three times a day (TID) | ORAL | 0 refills | Status: DC | PRN
Start: 2022-07-30 — End: 2022-12-31

## 2022-07-31 NOTE — Telephone Encounter (Signed)
Evenity Injection Schedule Inj #1 07/02/22 Inj #2  Inj #3  Inj #4  Inj #5  Inj #6  Inj #7  Inj #8  Inj #9  Inj #10  Inj #11  Inj #12

## 2022-08-12 NOTE — Telephone Encounter (Signed)
Roberta Stanley,  Do you know if this pt plans to continue with Evenity injections? There is a patient message that makes me think she may be considering switching to a different medication.   Thank for checking in to this for me!  Gearldine Bienenstock

## 2022-08-14 ENCOUNTER — Telehealth: Payer: Self-pay | Admitting: Family Medicine

## 2022-08-14 NOTE — Telephone Encounter (Signed)
Called pt to clarify she is participating in the [employer] Copay program.  Collect $25 per [facility] nurse visit, Patient advised she is responsible for submitting any patient bills/EOBs  related to Evenity injection to [employer]

## 2022-08-15 LAB — HM MAMMOGRAPHY

## 2022-08-20 ENCOUNTER — Ambulatory Visit (INDEPENDENT_AMBULATORY_CARE_PROVIDER_SITE_OTHER): Payer: Commercial Managed Care - HMO | Admitting: *Deleted

## 2022-08-20 DIAGNOSIS — M8000XG Age-related osteoporosis with current pathological fracture, unspecified site, subsequent encounter for fracture with delayed healing: Secondary | ICD-10-CM | POA: Diagnosis not present

## 2022-08-20 MED ORDER — ROMOSOZUMAB-AQQG 105 MG/1.17ML ~~LOC~~ SOSY
210.0000 mg | PREFILLED_SYRINGE | Freq: Once | SUBCUTANEOUS | Status: AC
Start: 2022-08-20 — End: 2022-08-20
  Administered 2022-08-20: 210 mg via SUBCUTANEOUS

## 2022-08-20 NOTE — Progress Notes (Signed)
Patient is here for nurse visit for her 2nd Evenity. Patient received bilateral [place] injections.  She tolerated injections well. She will call us if needed and she will return in 1 month for her next Evenity injection.

## 2022-08-26 ENCOUNTER — Other Ambulatory Visit: Payer: Self-pay | Admitting: Physician Assistant

## 2022-09-20 ENCOUNTER — Ambulatory Visit: Payer: Commercial Managed Care - HMO | Admitting: Family Medicine

## 2022-09-20 DIAGNOSIS — M810 Age-related osteoporosis without current pathological fracture: Secondary | ICD-10-CM | POA: Diagnosis not present

## 2022-09-20 MED ORDER — ROMOSOZUMAB-AQQG 105 MG/1.17ML ~~LOC~~ SOSY
210.0000 mg | PREFILLED_SYRINGE | Freq: Once | SUBCUTANEOUS | Status: AC
  Administered 2022-09-20: 210 mg via SUBCUTANEOUS

## 2022-09-20 NOTE — Progress Notes (Unsigned)
Patient is here for evenity injection #3. Patient received bilateral arm [place] evenity injections today. She tolerated injections well. She will return in 1 month for her next evenity injection.   

## 2022-09-25 ENCOUNTER — Other Ambulatory Visit: Payer: Self-pay | Admitting: Family Medicine

## 2022-09-25 DIAGNOSIS — S32010A Wedge compression fracture of first lumbar vertebra, initial encounter for closed fracture: Secondary | ICD-10-CM

## 2022-09-25 DIAGNOSIS — S22080A Wedge compression fracture of T11-T12 vertebra, initial encounter for closed fracture: Secondary | ICD-10-CM

## 2022-10-03 NOTE — Telephone Encounter (Addendum)
Evenity Injection Schedule Inj #1 - 07/02/22 Inj #2 - 08/20/22 Inj #3 - 09/20/22 Inj #4 - 10/22/22 Inj #5 - 11/28/22 Inj #6 - 01/06/23 Inj #7 - 02/06/23  Re-run benefits for 2025  Inj #8 - scheduled 03/10/23 Inj #9 - scheduled 04/14/23 Inj #10  Inj #11  Inj #12

## 2022-10-08 ENCOUNTER — Telehealth: Payer: Self-pay | Admitting: Neurology

## 2022-10-08 NOTE — Telephone Encounter (Signed)
LVM and sent mychart msg informing pt of need to reschedule 11/21/22 appt - NP out

## 2022-10-18 ENCOUNTER — Telehealth: Payer: Self-pay | Admitting: Nurse Practitioner

## 2022-10-18 DIAGNOSIS — F419 Anxiety disorder, unspecified: Secondary | ICD-10-CM

## 2022-10-18 MED ORDER — ESCITALOPRAM OXALATE 10 MG PO TABS
10.0000 mg | ORAL_TABLET | Freq: Every day | ORAL | 0 refills | Status: DC
Start: 2022-10-18 — End: 2022-11-06

## 2022-10-18 NOTE — Telephone Encounter (Signed)
Prescription Request  10/18/2022  LOV: 05/06/2022  What is the name of the medication or equipment? escitalopram (LEXAPRO) 10 MG tablet [[id number]]   Have you contacted your pharmacy to request a refill? No   Which pharmacy would you like this sent to?     [facility] [address] [place], [place] - [address] AT [address] [place] [address] Phone: [phone] Fax: [phone]   Patient notified that their request is being sent to the clinical staff for review and that they should receive a response within 2 business days.   Please advise at Mobile [phone] (mobile)

## 2022-10-18 NOTE — Telephone Encounter (Signed)
Requesting:  escitalopram (LEXAPRO) 10 MG tablet  Last Visit: 05/09/2022 Next Visit: 11/06/2022 Last Refill: 07/28/2022  Please Advise

## 2022-10-18 NOTE — Telephone Encounter (Signed)
 I called and notified patient that Rx sent to pharmacy

## 2022-10-22 ENCOUNTER — Ambulatory Visit: Payer: Commercial Managed Care - HMO | Admitting: Family Medicine

## 2022-10-22 DIAGNOSIS — M810 Age-related osteoporosis without current pathological fracture: Secondary | ICD-10-CM

## 2022-10-22 MED ORDER — ROMOSOZUMAB-AQQG 105 MG/1.17ML ~~LOC~~ SOSY
210.0000 mg | PREFILLED_SYRINGE | Freq: Once | SUBCUTANEOUS | Status: AC
  Administered 2022-10-22: 210 mg via SUBCUTANEOUS

## 2022-10-22 NOTE — Progress Notes (Signed)
 Patient is here for evenity injection #4. Patient received bilateral arm [place] evenity injections today. She tolerated injections well. She will return in 1 month for her next evenity injection

## 2022-10-25 ENCOUNTER — Encounter: Payer: Self-pay | Admitting: Physician Assistant

## 2022-10-25 ENCOUNTER — Ambulatory Visit: Payer: Commercial Managed Care - HMO | Admitting: Physician Assistant

## 2022-10-25 VITALS — BP 151/88 | HR 92 | Temp 98.1°F | Ht 69.0 in | Wt 157.8 lb

## 2022-10-25 DIAGNOSIS — R3915 Urgency of urination: Secondary | ICD-10-CM | POA: Diagnosis not present

## 2022-10-25 DIAGNOSIS — R3129 Other microscopic hematuria: Secondary | ICD-10-CM

## 2022-10-25 LAB — POCT URINALYSIS DIP (MANUAL ENTRY)
Bilirubin, UA: NEGATIVE
Glucose, UA: NEGATIVE mg/dL
Ketones, POC UA: NEGATIVE mg/dL
Leukocytes, UA: NEGATIVE
Nitrite, UA: NEGATIVE
Protein Ur, POC: NEGATIVE mg/dL
Spec Grav, UA: 1.015 (ref 1.010–1.025)
Urobilinogen, UA: 0.2 E.U./dL
pH, UA: 6 (ref 5.0–8.0)

## 2022-10-25 MED ORDER — PHENAZOPYRIDINE HCL 100 MG PO TABS
100.0000 mg | ORAL_TABLET | Freq: Three times a day (TID) | ORAL | 0 refills | Status: DC | PRN
Start: 2022-10-25 — End: 2022-12-31

## 2022-10-25 MED ORDER — NITROFURANTOIN MONOHYD MACRO 100 MG PO CAPS
100.0000 mg | ORAL_CAPSULE | Freq: Two times a day (BID) | ORAL | 0 refills | Status: AC
Start: 2022-10-25 — End: 2022-10-30

## 2022-10-25 NOTE — Progress Notes (Signed)
Established patient visit   Patient: Roberta Sims   DOB: 1971/02/01   52 y.o. Female  MRN: [id number] Visit Date: 10/25/2022  Today's healthcare provider: Alfredia Ferguson, PA-C   Cc. Urgency, lower abdominal pain  Subjective    HPI   Pt reports low back pain, lower abdominal pain, urgency, full bladder but not much output. Low back pain x 2-3 weeks.  Denies dysuria, denies hematuria.  Medications: Outpatient Medications Prior to Visit  Medication Sig   Ascorbic Acid (VITAMIN C) 100 MG tablet Take 100 mg by mouth daily.   Cetirizine HCl (ZYRTEC PO) Take by mouth.   cyclobenzaprine (FLEXERIL) 10 MG tablet Take 1 tablet (10 mg total) by mouth 3 (three) times daily as needed for muscle spasms.   escitalopram (LEXAPRO) 10 MG tablet Take 1 tablet (10 mg total) by mouth daily.   NON FORMULARY Cpap machine nightly   omeprazole (PRILOSEC) 40 MG capsule TAKE ONE CAPSULE BY MOUTH TWICE A DAY   zinc gluconate 50 MG tablet Take 50 mg by mouth daily.   zolpidem (AMBIEN) 5 MG tablet Take 1 tablet (5 mg total) by mouth at bedtime as needed for sleep.   [DISCONTINUED] HYDROcodone-acetaminophen (NORCO) 7.5-325 MG tablet Take 1 tablet by mouth every 8 (eight) hours as needed for moderate pain (cough).   [DISCONTINUED] acetaminophen (TYLENOL) 500 MG tablet Take 1 tablet (500 mg total) by mouth every 6 (six) hours as needed.   [DISCONTINUED] amoxicillin-clavulanate (AUGMENTIN) 875-125 MG tablet Take 1 tablet by mouth every 12 (twelve) hours.   [DISCONTINUED] calcitonin, salmon, (MIACALCIN) 200 UNIT/ACT nasal spray Place 1 spray into alternate nostrils daily.   [DISCONTINUED] doxycycline (VIBRA-TABS) 100 MG tablet Take 1 tablet (100 mg total) by mouth 2 (two) times daily. (Patient not taking: Reported on 06/24/2022)   [DISCONTINUED] fluconazole (DIFLUCAN) 150 MG tablet Take 1 tablet after finishing antibiotic, then take a second one 3 days later if needed (Patient not taking: Reported on  06/24/2022)   [DISCONTINUED] fluticasone (FLONASE) 50 MCG/ACT nasal spray Place 2 sprays into both nostrils daily.   [DISCONTINUED] oxymetazoline (AFRIN NASAL SPRAY) 0.05 % nasal spray Place 1 spray into both nostrils 2 (two) times daily. Use only for 3days, then stop   [DISCONTINUED] potassium chloride SA (KLOR-CON M) 20 MEQ tablet Take 1 tablet (20 mEq total) by mouth once for 1 dose.   [DISCONTINUED] predniSONE (DELTASONE) 10 MG tablet Take 6 tablets today and tomorrow, 5 tablets the next 2 days, then decrease by 1 tablet every other day until gone (Patient not taking: Reported on 06/24/2022)   [DISCONTINUED] promethazine-dextromethorphan (PROMETHAZINE-DM) 6.25-15 MG/5ML syrup Take 5 mLs by mouth 4 (four) times daily as needed for cough.   [DISCONTINUED] sodium chloride (OCEAN) 0.65 % SOLN nasal spray Place 1 spray into both nostrils as needed for congestion.   [DISCONTINUED] Vitamin D, Ergocalciferol, (DRISDOL) 1.25 MG (50000 UNIT) CAPS capsule Take 1 capsule (50,000 Units total) by mouth every 7 (seven) days. Take for 8 total doses(weeks) (Patient not taking: Reported on 06/24/2022)   No facility-administered medications prior to visit.    Review of Systems  Constitutional:  Negative for fatigue and fever.  Respiratory:  Negative for cough and shortness of breath.   Cardiovascular:  Negative for chest pain and leg swelling.  Gastrointestinal:  Negative for abdominal pain.  Neurological:  Negative for dizziness and headaches.      Objective    BP (!) 151/88   Pulse 92   Temp 98.1  F (36.7 C)   Ht 5\' 9"  (1.753 m)   Wt 157 lb 12.8 oz (71.6 kg)   SpO2 97%   BMI 23.30 kg/m   Physical Exam Constitutional:      General: She is awake.     Appearance: She is well-developed.  HENT:     Head: Normocephalic.  Eyes:     Conjunctiva/sclera: Conjunctivae normal.  Cardiovascular:     Rate and Rhythm: Normal rate and regular rhythm.     Heart sounds: Normal heart sounds.  Pulmonary:      Effort: Pulmonary effort is normal.     Breath sounds: Normal breath sounds.  Abdominal:     Palpations: Abdomen is soft.     Tenderness: There is abdominal tenderness in the right lower quadrant, suprapubic area and left lower quadrant. There is no right CVA tenderness or left CVA tenderness.  Skin:    General: Skin is warm.  Neurological:     Mental Status: She is alert and oriented to person, place, and time.  Psychiatric:        Attention and Perception: Attention normal.        Mood and Affect: Mood normal.        Speech: Speech normal.        Behavior: Behavior is cooperative.      No results found for any visits on 10/25/22.  Assessment & Plan     1. Urinary urgency UA + trace blood but neg leuk Ordered culture, micro   Given symptoms, treating empirically w/ macrobid, rx pyridium for symptoms  Will f/u with lab results  - POCT urinalysis dipstick - Urine Microscopic Only - Urine Culture - nitrofurantoin, macrocrystal-monohydrate, (MACROBID) 100 MG capsule; Take 1 capsule (100 mg total) by mouth 2 (two) times daily for 5 days.  Dispense: 10 capsule; Refill: 0 - phenazopyridine (PYRIDIUM) 100 MG tablet; Take 1 tablet (100 mg total) by mouth 3 (three) times daily as needed for pain.  Dispense: 21 tablet; Refill: 0  2. Microscopic hematuria - Urine Microscopic Only   Return if symptoms worsen or fail to improve.      I, Alfredia Ferguson, PA-C have reviewed all documentation for this visit. The documentation on  10/25/22   for the exam, diagnosis, procedures, and orders are all accurate and complete.    Alfredia Ferguson, PA-C  [facility] [phone] (phone) [phone] (fax)  [facility]

## 2022-10-26 LAB — URINE CULTURE
MICRO NUMBER:: 15405675
SPECIMEN QUALITY:: ADEQUATE

## 2022-10-26 LAB — EXTRA URINE SPECIMEN

## 2022-10-26 LAB — URINALYSIS, MICROSCOPIC ONLY
Hyaline Cast: NONE SEEN /LPF
WBC, UA: NONE SEEN /HPF (ref 0–5)

## 2022-11-06 ENCOUNTER — Ambulatory Visit (INDEPENDENT_AMBULATORY_CARE_PROVIDER_SITE_OTHER): Payer: Commercial Managed Care - HMO | Admitting: Nurse Practitioner

## 2022-11-06 ENCOUNTER — Encounter: Payer: Self-pay | Admitting: Nurse Practitioner

## 2022-11-06 VITALS — BP 130/92 | HR 97 | Temp 97.3°F | Ht 69.0 in | Wt 157.0 lb

## 2022-11-06 DIAGNOSIS — E785 Hyperlipidemia, unspecified: Secondary | ICD-10-CM | POA: Diagnosis not present

## 2022-11-06 DIAGNOSIS — M810 Age-related osteoporosis without current pathological fracture: Secondary | ICD-10-CM | POA: Diagnosis not present

## 2022-11-06 DIAGNOSIS — R350 Frequency of micturition: Secondary | ICD-10-CM | POA: Diagnosis not present

## 2022-11-06 DIAGNOSIS — D500 Iron deficiency anemia secondary to blood loss (chronic): Secondary | ICD-10-CM

## 2022-11-06 DIAGNOSIS — F419 Anxiety disorder, unspecified: Secondary | ICD-10-CM | POA: Diagnosis not present

## 2022-11-06 DIAGNOSIS — G4700 Insomnia, unspecified: Secondary | ICD-10-CM

## 2022-11-06 DIAGNOSIS — E559 Vitamin D deficiency, unspecified: Secondary | ICD-10-CM

## 2022-11-06 DIAGNOSIS — M8000XG Age-related osteoporosis with current pathological fracture, unspecified site, subsequent encounter for fracture with delayed healing: Secondary | ICD-10-CM

## 2022-11-06 LAB — LIPID PANEL
Cholesterol: 286 mg/dL — ABNORMAL HIGH (ref 0–200)
HDL: 42.3 mg/dL (ref >39.00)
LDL Cholesterol: 173 mg/dL — ABNORMAL HIGH (ref 0–99)
NonHDL: 243.88
Total CHOL/HDL Ratio: 7
Triglycerides: 352 mg/dL — ABNORMAL HIGH (ref 0.0–149.0)
VLDL: 70.4 mg/dL — ABNORMAL HIGH (ref 0.0–40.0)

## 2022-11-06 LAB — CBC WITH DIFFERENTIAL/PLATELET
Basophils Absolute: 0.1 10*3/uL (ref 0.0–0.1)
Basophils Relative: 1.1 % (ref 0.0–3.0)
Eosinophils Absolute: 0.5 10*3/uL (ref 0.0–0.7)
Eosinophils Relative: 8.6 % — ABNORMAL HIGH (ref 0.0–5.0)
HCT: 42.1 % (ref 36.0–46.0)
Hemoglobin: 13.8 g/dL (ref 12.0–15.0)
Lymphocytes Relative: 27.2 % (ref 12.0–46.0)
Lymphs Abs: 1.6 10*3/uL (ref 0.7–4.0)
MCHC: 32.8 g/dL (ref 30.0–36.0)
MCV: 95.1 fl (ref 78.0–100.0)
Monocytes Absolute: 0.5 10*3/uL (ref 0.1–1.0)
Monocytes Relative: 8.3 % (ref 3.0–12.0)
Neutro Abs: 3.1 10*3/uL (ref 1.4–7.7)
Neutrophils Relative %: 54.8 % (ref 43.0–77.0)
Platelets: 237 10*3/uL (ref 150.0–400.0)
RBC: 4.43 Mil/uL (ref 3.87–5.11)
RDW: 14.1 % (ref 11.5–15.5)
WBC: 5.7 10*3/uL (ref 4.0–10.5)

## 2022-11-06 LAB — COMPREHENSIVE METABOLIC PANEL
ALT: 18 U/L (ref 0–35)
AST: 19 U/L (ref 0–37)
Albumin: 4 g/dL (ref 3.5–5.2)
Alkaline Phosphatase: 102 U/L (ref 39–117)
BUN: 12 mg/dL (ref 6–23)
CO2: 27 meq/L (ref 19–32)
Calcium: 9.2 mg/dL (ref 8.4–10.5)
Chloride: 102 meq/L (ref 96–112)
Creatinine, Ser: 0.63 mg/dL (ref 0.40–1.20)
GFR: 100.63 mL/min (ref >60.00)
Glucose, Bld: 91 mg/dL (ref 70–99)
Potassium: 4.2 meq/L (ref 3.5–5.1)
Sodium: 136 meq/L (ref 135–145)
Total Bilirubin: 0.5 mg/dL (ref 0.2–1.2)
Total Protein: 6.7 g/dL (ref 6.0–8.3)

## 2022-11-06 LAB — POCT URINALYSIS DIPSTICK
Bilirubin, UA: NEGATIVE
Blood, UA: POSITIVE
Glucose, UA: NEGATIVE
Ketones, UA: NEGATIVE
Leukocytes, UA: NEGATIVE
Nitrite, UA: NEGATIVE
Protein, UA: POSITIVE — AB
Spec Grav, UA: 1.015 (ref 1.010–1.025)
Urobilinogen, UA: 0.2 U/dL
pH, UA: 6 (ref 5.0–8.0)

## 2022-11-06 LAB — VITAMIN D 25 HYDROXY (VIT D DEFICIENCY, FRACTURES): VITD: 31.18 ng/mL (ref 30.00–100.00)

## 2022-11-06 MED ORDER — ZOLPIDEM TARTRATE 5 MG PO TABS: 5.0000 mg | ORAL_TABLET | Freq: Every evening | ORAL | 2 refills | Status: DC | PRN

## 2022-11-06 MED ORDER — ESCITALOPRAM OXALATE 10 MG PO TABS
10.0000 mg | ORAL_TABLET | Freq: Every day | ORAL | 1 refills | Status: DC
Start: 2022-11-06 — End: 2023-11-05

## 2022-11-06 NOTE — Assessment & Plan Note (Signed)
She is not currently taking any cholesterol medications as she did not want to take anything with the Evenity injections.  Will check lipid panel today and treat based on results.

## 2022-11-06 NOTE — Progress Notes (Signed)
Established Patient Office Visit  Subjective   Patient ID: Roberta Sims, female    DOB: 05/23/1970  Age: 52 y.o. MRN: [id number]  Chief Complaint  Patient presents with   Medication Management    Rx refills and lab work    HPI  [facility] is here to follow-up on hyperlipidemia, anxiety, and insomnia.  She states that she is doing well.  She is continuing to get the Evenity injections for her osteoporosis.  She states that when she started these injections, she stopped her cholesterol medication as she wanted to make sure there was no interactions or no which was causing side effects.  She is interested in having her cholesterol levels rechecked today.  She states that her anxiety is well-controlled and is taking Lexapro 10 mg daily.  She also takes Ambien 5 mg as needed at bedtime to help with sleep.  She states that overall her mood is stable.  She denies SI/HI.  She has a history of iron deficiency anemia requiring infusions.  She is interested in getting her iron counts rechecked to see how they are doing currently.  She has been experiencing urinary frequency for the last 2 weeks.  She had a UA done 2 weeks ago which showed few bacteria, however urine culture was negative.  She was given a few days of antibiotics, however symptoms have not improved.  She is still having urinary frequency where she can either urinate a small amount or regular amount.  She denies dysuria, fevers.     11/06/2022   10:23 AM 03/05/2022   10:52 AM 11/26/2021    4:39 PM 06/29/2021    9:09 AM 06/22/2021   11:34 AM  Depression screen PHQ 2/9  Decreased Interest 0 0 0 0 0  Down, Depressed, Hopeless 0 0 0 0 0  PHQ - 2 Score 0 0 0 0 0  Altered sleeping 3 1 1     Tired, decreased energy 1 1 1     Change in appetite 0 1 1    Feeling bad or failure about yourself  0 0 0    Trouble concentrating 1 1 0    Moving slowly or fidgety/restless 0 0 0    Suicidal thoughts 0 0 0    PHQ-9 Score 5 4 3      Difficult doing work/chores Not difficult at all Somewhat difficult Very difficult        11/06/2022   10:23 AM 03/05/2022   10:52 AM 11/26/2021    4:39 PM 09/11/2018    3:51 PM  GAD 7 : Generalized Anxiety Score  Nervous, Anxious, on Edge 0 1 0 0  Control/stop worrying 0 0 0 0  Worry too much - different things 1 0 0 0  Trouble relaxing 0 1 0 0  Restless 0 0 0 0  Easily annoyed or irritable 1 1 1  0  Afraid - awful might happen 0 0 0 0  Total GAD 7 Score 2 3 1  0  Anxiety Difficulty Not difficult at all Not difficult at all Very difficult    ROS See pertinent positives and negatives per HPI.    Objective:     BP (!) 130/92 (BP Location: Left Arm)   Pulse 97   Temp (!) 97.3 F (36.3 C)   Ht 5\' 9"  (1.753 m)   Wt 157 lb (71.2 kg)   SpO2 98%   BMI 23.18 kg/m     Physical Exam Vitals and nursing note reviewed.  Constitutional:      General: She is not in acute distress.    Appearance: Normal appearance.  HENT:     Head: Normocephalic.  Eyes:     Conjunctiva/sclera: Conjunctivae normal.  Cardiovascular:     Rate and Rhythm: Normal rate and regular rhythm.     Pulses: Normal pulses.     Heart sounds: Normal heart sounds.  Pulmonary:     Effort: Pulmonary effort is normal.     Breath sounds: Normal breath sounds.  Musculoskeletal:     Cervical back: Normal range of motion.  Skin:    General: Skin is warm.  Neurological:     General: No focal deficit present.     Mental Status: She is alert and oriented to person, place, and time.  Psychiatric:        Mood and Affect: Mood normal.        Behavior: Behavior normal.        Thought Content: Thought content normal.        Judgment: Judgment normal.      Results for orders placed or performed in visit on 11/06/22  POCT urinalysis dipstick  Result Value Ref Range   Color, UA     Clarity, UA     Glucose, UA Negative Negative   Bilirubin, UA Negative    Ketones, UA Negative    Spec Grav, UA 1.015 1.010 - 1.025    Blood, UA Positive    pH, UA 6.0 5.0 - 8.0   Protein, UA Positive (A) Negative   Urobilinogen, UA 0.2 0.2 or 1.0 E.U./dL   Nitrite, UA Negative    Leukocytes, UA Negative Negative   Appearance     Odor         The 10-year ASCVD risk score (Arnett DK, et al., 2019) is: 3%    Assessment & Plan:   Problem List Items Addressed This Visit       Musculoskeletal and Integument   Osteoporosis    She is currently getting Evenity injections monthly through the end of the year.  She has a follow-up DEXA scan ordered.        Other   Anxiety - Primary    Chronic, stable.  Continue Lexapro 10 mg daily.  Denies SI/HI.  Follow-up in 6 months.      Relevant Medications   escitalopram (LEXAPRO) 10 MG tablet   Vitamin D deficiency    Will check vitamin D levels and treat based on results.      Relevant Orders   VITAMIN D 25 Hydroxy (Vit-D Deficiency, Fractures)   Hyperlipidemia    She is not currently taking any cholesterol medications as she did not want to take anything with the Evenity injections.  Will check lipid panel today and treat based on results.      Relevant Orders   CBC with Differential/Platelet   Comprehensive metabolic panel   Lipid panel   Insomnia    Continue ambien 5mg  prn sleep. PDMP reviewed. Refill sent to the pharmacy.       Relevant Medications   zolpidem (AMBIEN) 5 MG tablet   IDA (iron deficiency anemia)    She has received some infusions for iron.  Will check CBC and iron panel today.      Relevant Orders   CBC with Differential/Platelet   Iron, TIBC and Ferritin Panel   Other Visit Diagnoses     Urinary frequency       Check u/a today. May be  overactive bladder. Discussed pelvic floor exercises and limiting triggering foods.   Relevant Orders   POCT urinalysis dipstick (Completed)       Return in about 6 months (around 05/06/2023) for CPE.    Gerre Scull, NP

## 2022-11-06 NOTE — Assessment & Plan Note (Signed)
Will check vitamin D levels and treat based on results.  

## 2022-11-06 NOTE — Patient Instructions (Signed)
It was great to see you!  We are checking your labs today and will let you know the results via mychart/phone.   I have sent in refills  Try to limit the amount of alcohol, caffeine, spicy foods and see if this helps with your bladder.   Let's follow-up in 6 months, sooner if you have concerns.  If a referral was placed today, you will be contacted for an appointment. Please note that routine referrals can sometimes take up to 3-4 weeks to process. Please call our office if you haven't heard anything after this time frame.  Take care,  Rodman Pickle, NP

## 2022-11-06 NOTE — Assessment & Plan Note (Signed)
She is currently getting Evenity injections monthly through the end of the year.  She has a follow-up DEXA scan ordered.

## 2022-11-06 NOTE — Assessment & Plan Note (Signed)
She has received some infusions for iron.  Will check CBC and iron panel today.

## 2022-11-06 NOTE — Assessment & Plan Note (Signed)
Chronic, stable.  Continue Lexapro 10 mg daily.  Denies SI/HI.  Follow-up in 6 months.

## 2022-11-06 NOTE — Assessment & Plan Note (Signed)
Continue ambien 5mg  prn sleep. PDMP reviewed. Refill sent to the pharmacy.

## 2022-11-07 LAB — IRON,TIBC AND FERRITIN PANEL
%SAT: 26 % (ref 16–45)
Ferritin: 13 ng/mL — ABNORMAL LOW (ref 16–232)
Iron: 83 ug/dL (ref 45–160)
TIBC: 314 ug/dL (ref 250–450)

## 2022-11-11 ENCOUNTER — Encounter: Payer: Self-pay | Admitting: Nurse Practitioner

## 2022-11-11 ENCOUNTER — Encounter: Payer: Self-pay | Admitting: Family

## 2022-11-21 ENCOUNTER — Ambulatory Visit: Payer: Commercial Managed Care - HMO | Admitting: Neurology

## 2022-11-25 ENCOUNTER — Ambulatory Visit: Payer: Commercial Managed Care - HMO | Admitting: Family Medicine

## 2022-11-26 ENCOUNTER — Telehealth: Payer: Self-pay

## 2022-11-26 MED ORDER — ROSUVASTATIN CALCIUM 20 MG PO TABS: 20.0000 mg | ORAL_TABLET | Freq: Every day | ORAL | 1 refills | Status: DC

## 2022-11-26 NOTE — Telephone Encounter (Signed)
Requesting: Rosuvastatin Calcium 20mg  Last Visit: 11/06/2022 Next Visit: Visit date not found Last Refill: 12/03/2021 End Date 03/21/2022  Please Advise

## 2022-11-28 ENCOUNTER — Inpatient Hospital Stay: Payer: Managed Care, Other (non HMO) | Attending: Hematology & Oncology

## 2022-11-28 ENCOUNTER — Ambulatory Visit: Payer: Commercial Managed Care - HMO | Admitting: Family Medicine

## 2022-11-28 VITALS — BP 158/98 | HR 75 | Temp 98.2°F | Resp 17

## 2022-11-28 DIAGNOSIS — D509 Iron deficiency anemia, unspecified: Secondary | ICD-10-CM

## 2022-11-28 DIAGNOSIS — M810 Age-related osteoporosis without current pathological fracture: Secondary | ICD-10-CM

## 2022-11-28 MED ORDER — SODIUM CHLORIDE 0.9 % IV SOLN: Freq: Once | INTRAVENOUS | Status: AC

## 2022-11-28 MED ORDER — ROMOSOZUMAB-AQQG 105 MG/1.17ML ~~LOC~~ SOSY
210.0000 mg | PREFILLED_SYRINGE | Freq: Once | SUBCUTANEOUS | Status: AC
Start: 2022-11-28 — End: 2022-11-28
  Administered 2022-11-28: 210 mg via SUBCUTANEOUS

## 2022-11-28 MED ORDER — SODIUM CHLORIDE 0.9 % IV SOLN
300.0000 mg | Freq: Once | INTRAVENOUS | Status: AC
  Administered 2022-11-28: 300 mg via INTRAVENOUS
  Filled 2022-11-28: qty 300

## 2022-11-28 NOTE — Patient Instructions (Signed)
Iron Sucrose Injection What is this medication? IRON SUCROSE (EYE ern SOO krose) treats low levels of iron (iron deficiency anemia) in people with kidney disease. Iron is a mineral that plays an important role in making red blood cells, which carry oxygen from your lungs to the rest of your body. This medicine may be used for other purposes; ask your health care provider or pharmacist if you have questions. COMMON BRAND NAME(S): Venofer What should I tell my care team before I take this medication? They need to know if you have any of these conditions: Anemia not caused by low iron levels Heart disease High levels of iron in the blood Kidney disease Liver disease An unusual or allergic reaction to iron, other medications, foods, dyes, or preservatives Pregnant or trying to get pregnant Breastfeeding How should I use this medication? This medication is for infusion into a vein. It is given in a hospital or clinic setting. Talk to your care team about the use of this medication in children. While this medication may be prescribed for children as young as 2 years for selected conditions, precautions do apply. Overdosage: If you think you have taken too much of this medicine contact a poison control center or emergency room at once. NOTE: This medicine is only for you. Do not share this medicine with others. What if I miss a dose? Keep appointments for follow-up doses. It is important not to miss your dose. Call your care team if you are unable to keep an appointment. What may interact with this medication? Do not take this medication with any of the following: Deferoxamine Dimercaprol Other iron products This medication may also interact with the following: Chloramphenicol Deferasirox This list may not describe all possible interactions. Give your health care provider a list of all the medicines, herbs, non-prescription drugs, or dietary supplements you use. Also tell them if you smoke,  drink alcohol, or use illegal drugs. Some items may interact with your medicine. What should I watch for while using this medication? Visit your care team regularly. Tell your care team if your symptoms do not start to get better or if they get worse. You may need blood work done while you are taking this medication. You may need to follow a special diet. Talk to your care team. Foods that contain iron include: whole grains/cereals, dried fruits, beans, or peas, leafy green vegetables, and organ meats (liver, kidney). What side effects may I notice from receiving this medication? Side effects that you should report to your care team as soon as possible: Allergic reactions--skin rash, itching, hives, swelling of the face, lips, tongue, or throat Low blood pressure--dizziness, feeling faint or lightheaded, blurry vision Shortness of breath Side effects that usually do not require medical attention (report to your care team if they continue or are bothersome): Flushing Headache Joint pain Muscle pain Nausea Pain, redness, or irritation at injection site This list may not describe all possible side effects. Call your doctor for medical advice about side effects. You may report side effects to FDA at 1-800-FDA-1088. Where should I keep my medication? This medication is given in a hospital or clinic. It will not be stored at home. NOTE: This sheet is a summary. It may not cover all possible information. If you have questions about this medicine, talk to your doctor, pharmacist, or health care provider.  2024 Elsevier/Gold Standard (2022-07-19 00:00:00)

## 2022-11-28 NOTE — Progress Notes (Signed)
Patient is here for evenity injection #5. Patient received bilateral arm [place] evenity injections today. She tolerated injections well. She will return in 1 month for her next evenity injection.

## 2022-12-05 ENCOUNTER — Inpatient Hospital Stay: Payer: Managed Care, Other (non HMO)

## 2022-12-05 VITALS — BP 140/85 | HR 88 | Temp 98.3°F | Resp 17

## 2022-12-05 DIAGNOSIS — D509 Iron deficiency anemia, unspecified: Secondary | ICD-10-CM

## 2022-12-05 MED ORDER — SODIUM CHLORIDE 0.9 % IV SOLN
300.0000 mg | Freq: Once | INTRAVENOUS | Status: AC
  Administered 2022-12-05: 300 mg via INTRAVENOUS
  Filled 2022-12-05: qty 300

## 2022-12-05 MED ORDER — SODIUM CHLORIDE 0.9 % IV SOLN: Freq: Once | INTRAVENOUS | Status: AC

## 2022-12-05 NOTE — Patient Instructions (Signed)
Iron Sucrose Injection What is this medication? IRON SUCROSE (EYE ern SOO krose) treats low levels of iron (iron deficiency anemia) in people with kidney disease. Iron is a mineral that plays an important role in making red blood cells, which carry oxygen from your lungs to the rest of your body. This medicine may be used for other purposes; ask your health care provider or pharmacist if you have questions. COMMON BRAND NAME(S): Venofer What should I tell my care team before I take this medication? They need to know if you have any of these conditions: Anemia not caused by low iron levels Heart disease High levels of iron in the blood Kidney disease Liver disease An unusual or allergic reaction to iron, other medications, foods, dyes, or preservatives Pregnant or trying to get pregnant Breastfeeding How should I use this medication? This medication is for infusion into a vein. It is given in a hospital or clinic setting. Talk to your care team about the use of this medication in children. While this medication may be prescribed for children as young as 2 years for selected conditions, precautions do apply. Overdosage: If you think you have taken too much of this medicine contact a poison control center or emergency room at once. NOTE: This medicine is only for you. Do not share this medicine with others. What if I miss a dose? Keep appointments for follow-up doses. It is important not to miss your dose. Call your care team if you are unable to keep an appointment. What may interact with this medication? Do not take this medication with any of the following: Deferoxamine Dimercaprol Other iron products This medication may also interact with the following: Chloramphenicol Deferasirox This list may not describe all possible interactions. Give your health care provider a list of all the medicines, herbs, non-prescription drugs, or dietary supplements you use. Also tell them if you smoke,  drink alcohol, or use illegal drugs. Some items may interact with your medicine. What should I watch for while using this medication? Visit your care team regularly. Tell your care team if your symptoms do not start to get better or if they get worse. You may need blood work done while you are taking this medication. You may need to follow a special diet. Talk to your care team. Foods that contain iron include: whole grains/cereals, dried fruits, beans, or peas, leafy green vegetables, and organ meats (liver, kidney). What side effects may I notice from receiving this medication? Side effects that you should report to your care team as soon as possible: Allergic reactions--skin rash, itching, hives, swelling of the face, lips, tongue, or throat Low blood pressure--dizziness, feeling faint or lightheaded, blurry vision Shortness of breath Side effects that usually do not require medical attention (report to your care team if they continue or are bothersome): Flushing Headache Joint pain Muscle pain Nausea Pain, redness, or irritation at injection site This list may not describe all possible side effects. Call your doctor for medical advice about side effects. You may report side effects to FDA at 1-800-FDA-1088. Where should I keep my medication? This medication is given in a hospital or clinic. It will not be stored at home. NOTE: This sheet is a summary. It may not cover all possible information. If you have questions about this medicine, talk to your doctor, pharmacist, or health care provider.  2024 Elsevier/Gold Standard (2022-07-19 00:00:00)

## 2022-12-18 ENCOUNTER — Encounter: Payer: Self-pay | Admitting: Family

## 2022-12-19 ENCOUNTER — Encounter: Payer: Self-pay | Admitting: Family

## 2022-12-19 ENCOUNTER — Ambulatory Visit: Payer: Self-pay | Admitting: General Surgery

## 2022-12-19 DIAGNOSIS — D500 Iron deficiency anemia secondary to blood loss (chronic): Secondary | ICD-10-CM

## 2022-12-19 NOTE — Progress Notes (Signed)
Sent message, via epic in basket, requesting orders in epic from surgeon.  

## 2022-12-25 ENCOUNTER — Encounter: Payer: Self-pay | Admitting: Medical Oncology

## 2022-12-25 ENCOUNTER — Inpatient Hospital Stay: Payer: Managed Care, Other (non HMO)

## 2022-12-25 ENCOUNTER — Inpatient Hospital Stay: Payer: Managed Care, Other (non HMO) | Admitting: Medical Oncology

## 2022-12-25 VITALS — BP 142/86 | HR 99 | Temp 98.6°F | Resp 18 | Wt 156.1 lb

## 2022-12-25 DIAGNOSIS — D500 Iron deficiency anemia secondary to blood loss (chronic): Secondary | ICD-10-CM | POA: Diagnosis not present

## 2022-12-25 DIAGNOSIS — D509 Iron deficiency anemia, unspecified: Secondary | ICD-10-CM

## 2022-12-25 LAB — CBC WITH DIFFERENTIAL (CANCER CENTER ONLY)
Abs Immature Granulocytes: 0.02 10*3/uL (ref 0.00–0.07)
Basophils Absolute: 0.1 10*3/uL (ref 0.0–0.1)
Basophils Relative: 1 %
Eosinophils Absolute: 0.6 10*3/uL — ABNORMAL HIGH (ref 0.0–0.5)
Eosinophils Relative: 8 %
HCT: 39.5 % (ref 36.0–46.0)
Hemoglobin: 13.3 g/dL (ref 12.0–15.0)
Immature Granulocytes: 0 %
Lymphocytes Relative: 34 %
Lymphs Abs: 2.4 10*3/uL (ref 0.7–4.0)
MCH: 32 pg (ref 26.0–34.0)
MCHC: 33.7 g/dL (ref 30.0–36.0)
MCV: 95 fL (ref 80.0–100.0)
Monocytes Absolute: 0.6 10*3/uL (ref 0.1–1.0)
Monocytes Relative: 8 %
Neutro Abs: 3.5 10*3/uL (ref 1.7–7.7)
Neutrophils Relative %: 49 %
Platelet Count: 211 10*3/uL (ref 150–400)
RBC: 4.16 MIL/uL (ref 3.87–5.11)
RDW: 13 % (ref 11.5–15.5)
WBC Count: 7.1 10*3/uL (ref 4.0–10.5)
nRBC: 0 % (ref 0.0–0.2)

## 2022-12-25 LAB — RETICULOCYTES
Immature Retic Fract: 10.4 % (ref 2.3–15.9)
RBC.: 4.12 MIL/uL (ref 3.87–5.11)
Retic Count, Absolute: 79.9 10*3/uL (ref 19.0–186.0)
Retic Ct Pct: 1.9 % (ref 0.4–3.1)

## 2022-12-25 LAB — FERRITIN: Ferritin: 100 ng/mL (ref 11–307)

## 2022-12-25 NOTE — Progress Notes (Unsigned)
Hematology and Oncology Follow Up Visit  Roberta Sims [id number] 11/22/1970 52 y.o. 12/25/2022   Principle Diagnosis:  Iron deficiency anemia     Current Therapy:        IV iron as indicated - Venofer 300 mg- last dose 12/05/2022   Interim History:  Roberta Sims is here today for follow-up.   Today she reports that she is overall doing well.  She has not noted any blood loss. No bruising or petechiae.  Seen and followed by [facility]- Dr. Andrey Campanile. Hiatal hernia. No fever, chills, n/v, cough, rash, dizziness, chest pain, abdominal pain or changes in bowel or bladder habits.  Occasional mild palpitations and SOB with over exertion/exercise. She is working to build her stamina and takes a break to rest when needed.  Numbness and tingling in the left arm waxes and wanes. She is looking into finding a neurosurgeon in the area to do further work up.  No falls or syncope reported.  Appetite and hydration are good Wt Readings from Last 3 Encounters:  12/25/22 156 lb 1.3 oz (70.8 kg)  11/06/22 157 lb (71.2 kg)  10/25/22 157 lb 12.8 oz (71.6 kg)    ECOG Performance Status: 0 - Asymptomatic  Medications:  Allergies as of 12/25/2022       Reactions   Meloxicam Other (See Comments)   Headache        Medication List        Accurate as of December 25, 2022  3:01 PM. If you have any questions, ask your nurse or doctor.          cetirizine 10 MG tablet Commonly known as: ZYRTEC Take 10 mg by mouth daily.   cyclobenzaprine 10 MG tablet Commonly known as: FLEXERIL Take 1 tablet (10 mg total) by mouth 3 (three) times daily as needed for muscle spasms.   escitalopram 10 MG tablet Commonly known as: LEXAPRO Take 1 tablet (10 mg total) by mouth daily.   NON FORMULARY Cpap machine nightly   omeprazole 40 MG capsule Commonly known as: PRILOSEC TAKE ONE CAPSULE BY MOUTH TWICE A DAY   phenazopyridine 100 MG tablet Commonly known as: Pyridium Take 1 tablet (100 mg total) by  mouth 3 (three) times daily as needed for pain.   rosuvastatin 20 MG tablet Commonly known as: Crestor Take 1 tablet (20 mg total) by mouth daily.   vitamin C 100 MG tablet Take 100 mg by mouth daily.   zinc gluconate 50 MG tablet Take 50 mg by mouth daily.   zolpidem 5 MG tablet Commonly known as: AMBIEN Take 1 tablet (5 mg total) by mouth at bedtime as needed for sleep.        Allergies:  Allergies  Allergen Reactions   Meloxicam Other (See Comments)    Headache    Past Medical History, Surgical history, Social history, and Family History were reviewed and updated.  Review of Systems: All other 10 point review of systems is negative.   Physical Exam:  weight is 156 lb 1.3 oz (70.8 kg). Her oral temperature is 98.6 F (37 C). Her blood pressure is 142/86 (abnormal) and her pulse is 99. Her respiration is 18 and oxygen saturation is 99%.   Wt Readings from Last 3 Encounters:  12/25/22 156 lb 1.3 oz (70.8 kg)  11/06/22 157 lb (71.2 kg)  10/25/22 157 lb 12.8 oz (71.6 kg)    Ocular: Sclerae unicteric, pupils equal, round and reactive to light Ear-nose-throat: Oropharynx clear, dentition fair  Lymphatic: No cervical or supraclavicular adenopathy Lungs no rales or rhonchi, good excursion bilaterally Heart regular rate and rhythm, no murmur appreciated MSK no focal spinal tenderness, no joint edema Neuro: non-focal, well-oriented, appropriate affect  Lab Results  Component Value Date   WBC 7.1 12/25/2022   HGB 13.3 12/25/2022   HCT 39.5 12/25/2022   MCV 95.0 12/25/2022   PLT 211 12/25/2022   Lab Results  Component Value Date   FERRITIN 13 (L) 11/06/2022   IRON 83 11/06/2022   TIBC 314 11/06/2022   UIBC 160 06/24/2022   IRONPCTSAT 26 11/06/2022   Lab Results  Component Value Date   RETICCTPCT 1.9 12/25/2022   RBC 4.12 12/25/2022   RBC 4.16 12/25/2022   No results found for: "KPAFRELGTCHN", "LAMBDASER", "KAPLAMBRATIO" No results found for: "IGGSERUM",  "IGA", "IGMSERUM" No results found for: "TOTALPROTELP", "ALBUMINELP", "A1GS", "A2GS", "BETS", "BETA2SER", "GAMS", "MSPIKE", "SPEI"   Chemistry      Component Value Date/Time   NA 136 11/06/2022 1013   NA 139 12/11/2020 1349   K 4.2 11/06/2022 1013   CL 102 11/06/2022 1013   CO2 27 11/06/2022 1013   BUN 12 11/06/2022 1013   BUN 20 12/11/2020 1349   CREATININE 0.63 11/06/2022 1013   CREATININE 0.70 01/23/2022 1035      Component Value Date/Time   CALCIUM 9.2 11/06/2022 1013   ALKPHOS 102 11/06/2022 1013   AST 19 11/06/2022 1013   AST 14 (L) 01/23/2022 1035   ALT 18 11/06/2022 1013   ALT 12 01/23/2022 1035   BILITOT 0.5 11/06/2022 1013   BILITOT 0.4 01/23/2022 1035     Encounter Diagnoses  Name Primary?   Iron deficiency anemia, unspecified iron deficiency anemia type Yes   Anemia due to GI blood loss    Impression and Plan: Roberta Sims is a very pleasant 52 yo caucasian female with recent persistent iron deficiency anemia.   Hgb today is stable at 13.3.  Iron studies are pending. Will replace if needed RTC 6 months APP, labs (CBC, iron, ferritin)-[place]  Rushie Chestnut, PA-C 10/30/20243:01 PM

## 2022-12-25 NOTE — Patient Instructions (Signed)
DUE TO COVID-19 ONLY TWO VISITORS  (aged 54 and older)  ARE ALLOWED TO COME WITH YOU AND STAY IN THE WAITING ROOM ONLY DURING PRE OP AND PROCEDURE.   **NO VISITORS ARE ALLOWED IN THE SHORT STAY AREA OR RECOVERY ROOM!!**  IF YOU WILL BE ADMITTED INTO THE HOSPITAL YOU ARE ALLOWED ONLY FOUR SUPPORT PEOPLE DURING VISITATION HOURS ONLY (7 AM -8PM)   The support person(s) must pass our screening, gel in and out, and wear a mask at all times, including in the patient's room. Patients must also wear a mask when staff or their support person are in the room. Visitors GUEST BADGE MUST BE WORN VISIBLY  One adult visitor may remain with you overnight and MUST be in the room by 8 P.M.     Your procedure is scheduled on: 12/30/22   Report to [facility] Main Entrance    Report to admitting at : 5:15 AM   Call this number if you have problems the morning of surgery [phone]   Do not eat food :After Midnight.   After Midnight you may have the following liquids until : 4:30 AM DAY OF SURGERY  Water Black Coffee (sugar ok, NO MILK/CREAM OR CREAMERS)  Tea (sugar ok, NO MILK/CREAM OR CREAMERS) regular and decaf                             Plain Jell-O (NO RED)                                           Fruit ices (not with fruit pulp, NO RED)                                     Popsicles (NO RED)                                                                  Juice: apple, WHITE grape, WHITE cranberry Sports drinks like Gatorade (NO RED)              FOLLOW ANY ADDITIONAL PRE OP INSTRUCTIONS YOU RECEIVED FROM YOUR SURGEON'S OFFICE!!!   Oral Hygiene is also important to reduce your risk of infection.                                    Remember - BRUSH YOUR TEETH THE MORNING OF SURGERY WITH YOUR REGULAR TOOTHPASTE  DENTURES WILL BE REMOVED PRIOR TO SURGERY PLEASE DO NOT APPLY "Poly grip" OR ADHESIVES!!!   Do NOT smoke after Midnight   Take these medicines the morning of surgery with A  SIP OF WATER: escitalopram,cetirizine,omeprazole.                              You may not have any metal on your body including hair pins, jewelry, and body piercing  Do not wear make-up, lotions, powders, perfumes/cologne, or deodorant  Do not wear nail polish including gel and S&S, artificial/acrylic nails, or any other type of covering on natural nails including finger and toenails. If you have artificial nails, gel coating, etc. that needs to be removed by a nail salon please have this removed prior to surgery or surgery may need to be canceled/ delayed if the surgeon/ anesthesia feels like they are unable to be safely monitored.   Do not shave  48 hours prior to surgery.    Do not bring valuables to the hospital. [place] IS NOT             RESPONSIBLE   FOR VALUABLES.   Contacts, glasses, or bridgework may not be worn into surgery.   Bring small overnight bag day of surgery.   DO NOT BRING YOUR HOME MEDICATIONS TO THE HOSPITAL. PHARMACY WILL DISPENSE MEDICATIONS LISTED ON YOUR MEDICATION LIST TO YOU DURING YOUR ADMISSION IN THE HOSPITAL!    Patients discharged on the day of surgery will not be allowed to drive home.  Someone NEEDS to stay with you for the first 24 hours after anesthesia.   Special Instructions: Bring a copy of your healthcare power of attorney and living will documents         the day of surgery if you haven't scanned them before.              Please read over the following fact sheets you were given: IF YOU HAVE QUESTIONS ABOUT YOUR PRE-OP INSTRUCTIONS PLEASE CALL [phone]    [facility] - Preparing for Surgery Before surgery, you can play an important role.  Because skin is not sterile, your skin needs to be as free of germs as possible.  You can reduce the number of germs on your skin by washing with CHG (chlorahexidine gluconate) soap before surgery.  CHG is an antiseptic cleaner which kills germs and bonds with the skin to continue killing  germs even after washing. Please DO NOT use if you have an allergy to CHG or antibacterial soaps.  If your skin becomes reddened/irritated stop using the CHG and inform your nurse when you arrive at Short Stay. Do not shave (including legs and underarms) for at least 48 hours prior to the first CHG shower.  You may shave your face/neck. Please follow these instructions carefully:  1.  Shower with CHG Soap the night before surgery and the  morning of Surgery.  2.  If you choose to wash your hair, wash your hair first as usual with your  normal  shampoo.  3.  After you shampoo, rinse your hair and body thoroughly to remove the  shampoo.                           4.  Use CHG as you would any other liquid soap.  You can apply chg directly  to the skin and wash                       Gently with a scrungie or clean washcloth.  5.  Apply the CHG Soap to your body ONLY FROM THE NECK DOWN.   Do not use on face/ open                           Wound or open sores. Avoid contact with eyes,  ears mouth and genitals (private parts).                       Wash face,  Genitals (private parts) with your normal soap.             6.  Wash thoroughly, paying special attention to the area where your surgery  will be performed.  7.  Thoroughly rinse your body with warm water from the neck down.  8.  DO NOT shower/wash with your normal soap after using and rinsing off  the CHG Soap.                9.  Pat yourself dry with a clean towel.            10.  Wear clean pajamas.            11.  Place clean sheets on your bed the night of your first shower and do not  sleep with pets. Day of Surgery : Do not apply any lotions/deodorants the morning of surgery.  Please wear clean clothes to the hospital/surgery center.  FAILURE TO FOLLOW THESE INSTRUCTIONS MAY RESULT IN THE CANCELLATION OF YOUR SURGERY PATIENT SIGNATURE_________________________________  NURSE  SIGNATURE__________________________________  ________________________________________________________________________
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
## 2022-12-26 ENCOUNTER — Ambulatory Visit (INDEPENDENT_AMBULATORY_CARE_PROVIDER_SITE_OTHER): Payer: Commercial Managed Care - HMO

## 2022-12-26 ENCOUNTER — Encounter (HOSPITAL_COMMUNITY)
Admission: RE | Admit: 2022-12-26 | Discharge: 2022-12-26 | Disposition: A | Discharge status: Home / Self Care | Payer: Managed Care, Other (non HMO) | Source: Ambulatory Visit | Attending: General Surgery | Admitting: General Surgery

## 2022-12-26 ENCOUNTER — Encounter: Payer: Self-pay | Admitting: Family

## 2022-12-26 ENCOUNTER — Encounter (HOSPITAL_COMMUNITY): Payer: Self-pay

## 2022-12-26 ENCOUNTER — Ambulatory Visit: Payer: Commercial Managed Care - HMO | Admitting: Family Medicine

## 2022-12-26 ENCOUNTER — Ambulatory Visit: Payer: Managed Care, Other (non HMO) | Admitting: Neurology

## 2022-12-26 ENCOUNTER — Other Ambulatory Visit: Payer: Self-pay

## 2022-12-26 DIAGNOSIS — Z01812 Encounter for preprocedural laboratory examination: Secondary | ICD-10-CM | POA: Insufficient documentation

## 2022-12-26 DIAGNOSIS — D500 Iron deficiency anemia secondary to blood loss (chronic): Secondary | ICD-10-CM | POA: Diagnosis not present

## 2022-12-26 HISTORY — DX: Other complications of anesthesia, initial encounter: T88.59XA

## 2022-12-26 HISTORY — DX: Other specified postprocedural states: Z98.890

## 2022-12-26 HISTORY — DX: Nausea with vomiting, unspecified: R11.2

## 2022-12-26 HISTORY — DX: Anemia, unspecified: D64.9

## 2022-12-26 LAB — COMPREHENSIVE METABOLIC PANEL
ALT: 19 U/L (ref 0–44)
AST: 21 U/L (ref 15–41)
Albumin: 4.2 g/dL (ref 3.5–5.0)
Alkaline Phosphatase: 89 U/L (ref 38–126)
Anion gap: 7 (ref 5–15)
BUN: 19 mg/dL (ref 6–20)
CO2: 27 mmol/L (ref 22–32)
Calcium: 9.2 mg/dL (ref 8.9–10.3)
Chloride: 103 mmol/L (ref 98–111)
Creatinine, Ser: 0.42 mg/dL — ABNORMAL LOW (ref 0.44–1.00)
GFR, Estimated: >60 mL/min (ref >60)
Glucose, Bld: 101 mg/dL — ABNORMAL HIGH (ref 70–99)
Potassium: 4.4 mmol/L (ref 3.5–5.1)
Sodium: 137 mmol/L (ref 135–145)
Total Bilirubin: 0.8 mg/dL (ref 0.3–1.2)
Total Protein: 7.3 g/dL (ref 6.5–8.1)

## 2022-12-26 LAB — IRON AND IRON BINDING CAPACITY (CC-WL,HP ONLY)
Iron: 146 ug/dL (ref 28–170)
Saturation Ratios: 47 % — ABNORMAL HIGH (ref 10.4–31.8)
TIBC: 308 ug/dL (ref 250–450)
UIBC: 162 ug/dL (ref 148–442)

## 2022-12-26 NOTE — Progress Notes (Signed)
For Anesthesia: PCP -  Cardiologist -   Bowel Prep reminder:  Chest x-ray - 07/20/22 EKG - 07/20/22 Stress Test -  ECHO -  Cardiac Cath -  Pacemaker/ICD device last checked: Pacemaker orders received: Device Rep notified:  Spinal Cord Stimulator: N/A  Sleep Study - Yes CPAP - Yes  Fasting Blood Sugar - N/A Checks Blood Sugar _____ times a day Date and result of last Hgb A1c-  Last dose of GLP1 agonist- N/A GLP1 instructions:   Last dose of SGLT-2 inhibitors- N/A SGLT-2 instructions:   Blood Thinner Instructions: N/A Aspirin Instructions: Last Dose:  Activity level: Can go up a flight of stairs and activities of daily living without stopping and without chest pain and/or shortness of breath   Able to exercise without chest pain and/or shortness of breath  Anesthesia review: OSA(CPAP)  Patient denies shortness of breath, fever, cough and chest pain at PAT appointment   Patient verbalized understanding of instructions that were given to them at the PAT appointment. Patient was also instructed that they will need to review over the PAT instructions again at home before surgery.

## 2022-12-27 NOTE — Anesthesia Preprocedure Evaluation (Signed)
Anesthesia Evaluation  Patient identified by MRN, date of birth, ID band Patient awake    Reviewed: Allergy & Precautions, H&P , NPO status , Patient's Chart, lab work & pertinent test results  History of Anesthesia Complications (+) PONV and history of anesthetic complications  Airway Mallampati: III  TM Distance: >3 FB Neck ROM: Full    Dental no notable dental hx. (+) Teeth Intact, Dental Advisory Given   Pulmonary sleep apnea and Continuous Positive Airway Pressure Ventilation    Pulmonary exam normal breath sounds clear to auscultation       Cardiovascular Exercise Tolerance: Good negative cardio ROS  Rhythm:Regular Rate:Normal     Neuro/Psych   Anxiety     negative neurological ROS     GI/Hepatic Neg liver ROS,GERD  Medicated,,  Endo/Other  negative endocrine ROS    Renal/GU negative Renal ROS  negative genitourinary   Musculoskeletal   Abdominal   Peds  Hematology  (+) Blood dyscrasia, anemia   Anesthesia Other Findings   Reproductive/Obstetrics negative OB ROS                             Anesthesia Physical Anesthesia Plan  ASA: 3  Anesthesia Plan: General   Post-op Pain Management: Ofirmev IV (intra-op)* and Toradol IV (intra-op)*   Induction: Intravenous  PONV Risk Score and Plan: 4 or greater and Ondansetron, Dexamethasone, Propofol infusion, Midazolam and Scopolamine patch - Pre-op  Airway Management Planned: Oral ETT  Additional Equipment:   Intra-op Plan:   Post-operative Plan: Extubation in OR  Informed Consent: I have reviewed the patients History and Physical, chart, labs and discussed the procedure including the risks, benefits and alternatives for the proposed anesthesia with the patient or authorized representative who has indicated his/her understanding and acceptance.     Dental advisory given  Plan Discussed with: CRNA  Anesthesia Plan  Comments:        Anesthesia Quick Evaluation

## 2022-12-29 NOTE — H&P (Signed)
CC: here For surgery  Requesting provider: n/a  HPI: Hasset Chaviano is an 52 y.o. female who is here for robiotic assisted repair of large hiatal hernia with possible mesh, possible fundoplication, upper endoscopy.  Patient denies any medical changes since I saw her last in the clinic.  Old hpi: 06/2022  History of Present Illness: Jahnya Trindade is a 52 y.o. female who is seen today for long-term follow-up regarding her moderate to large hiatal hernia, history of iron deficiency anemia and history of Cameron ulcers. I initially met her in March of this year to discuss her symptoms and review her clinical history. Since then she has had esophageal manometry at [facility]. She comes back in today to discuss the results and finalize surgical planning..  She states there is really nothing new medically she did undergo an MRI of her lumbar spine and pelvis this morning to evaluate for potential additional fractures. Since her last visit she has decided to start a bone building medication that she gets injected once a month due to ongoing osteopenia and mild osteoporosis issues. Her first injection was this week. She is still having some bilateral hip pain. She is still having foregut issues but she states her symptoms are pretty well-controlled on twice a day omeprazole. She states that if she does not take her omeprazole she will have excessive heartburn as well as more frequent issue with a stuck sensation. She states as long as she takes her omeprazole her heartburn is pretty well-controlled and she does not have any dysphagia. She still has at times pressure in her epigastric area as well as her lower chest area.  Old HPI:  Patient states that she knew she had a hiatal hernia a few years ago when she had her last endoscopy. At that time she states she was told it was 3 cm. She was not terribly symptomatic. She states that she is fallen a few times over the past year or 2 and has had 5 fractures. She  was diagnosed with osteoporosis osteopenia and debated about starting a medication to strengthen her bones. On routine lab surveillance she was found to have a low iron level so they could not give her the injection anymore. She also endorsed significant fatigue and exhaustion and she was diagnosed with iron deficiency anemia. She underwent workup and evaluation by the hematologist and ultimately GI. She states that she had 3 IV iron infusions in December and her energy level significantly increased. They checked her stools which were negative. She does not menstruate anymore. She ended up having a repeat colonoscopy and an upper endoscopy the other month. She was found to have an enlarged hiatal hernia as well as Sheria Lang erosions. And she was sent here for further evaluation.  She states that her GI doctor put her on twice daily omeprazole at that time. He also dilated her esophagus. She states since taking the omeprazole she has had significant decrease in the burning in her chest but it still there at times. She burps a lot. She will have dysphagia with solids and liquids if she is not taking her PPI. She does sleep elevated but she has sleep apnea and is on CPAP. She does have epigastric discomfort and bending over. She also has some shortness of breath. She has some coughing issues and tends to cough more at night. She has had a laparoscopic assisted hysterectomy. She has an occasional acid sensation in her throat.  She is currently on some steroids  for inflamed lymph nodes. No smoking   Past Medical History:  Diagnosis Date   Anemia    Anxiety    Complication of anesthesia    Compression fracture of thoracic spine, non-traumatic, sequela    Dysphagia    GERD with stricture 03/04/2019   Hx of sessile serrated colonic polyp 06/28/2013   Hyperlipidemia    Osteoporosis    PONV (postoperative nausea and vomiting)    Scoliosis    Uterine prolapse     Past Surgical History:  Procedure  Laterality Date   ABDOMINAL HYSTERECTOMY     COLONOSCOPY     ESOPHAGOGASTRODUODENOSCOPY     harrington rod--scoliosis     PARTIAL HYSTERECTOMY      Family History  Problem Relation Age of Onset   Pancreatic cancer Mother        metastasized to colon   Alcohol abuse Brother    Diabetes Brother    Cancer Maternal Aunt        breast   Colon cancer Maternal Grandmother     Social:  reports that she has never smoked. She has never used smokeless tobacco. She reports current alcohol use. She reports that she does not use drugs.  Allergies:  Allergies  Allergen Reactions   Meloxicam Other (See Comments)    Headache    Medications: I have reviewed the patient's current medications.   ROS - all of the below systems have been reviewed with the patient and positives are indicated with bold text General: chills, fever or night sweats Eyes: blurry vision or double vision ENT: epistaxis or sore throat Allergy/Immunology: itchy/watery eyes or nasal congestion Hematologic/Lymphatic: bleeding problems, blood clots or swollen lymph nodes Endocrine: temperature intolerance or unexpected weight changes Breast: new or changing breast lumps or nipple discharge Resp: cough, shortness of breath, or wheezing CV: chest pain or dyspnea on exertion GI: as per HPI GU: dysuria, trouble voiding, or hematuria MSK: joint pain or joint stiffness Neuro: TIA or stroke symptoms Derm: pruritus and skin lesion changes Psych: anxiety and depression  PE There were no vitals taken for this visit. Constitutional: NAD; conversant; no deformities Eyes: Moist conjunctiva; no lid lag; anicteric; PERRL Neck: Trachea midline; no thyromegaly Lungs: Normal respiratory effort; no tactile fremitus CV: RRR; no palpable thrills; no pitting edema GI: Abd soft, nt; no palpable hepatosplenomegaly MSK: Normal gait; no clubbing/cyanosis Psychiatric: Appropriate affect; alert and oriented x3 Lymphatic: No palpable  cervical or axillary lymphadenopathy Skin:no rash/lesions/jaundice  No results found for this or any previous visit (from the past 48 hour(s)).  No results found.  Imaging: N/a  A/P: Jessicah Croll is an 52 y.o. female with  Hiatal hernia  Cameron ulcer, unspecified ulcer chronicity  History of iron deficiency anemia   To OR robotic assisted repair of large hiatal hernia with possible mesh, possible fundoplication Iv abx Eras protocol  Mary Sella. Andrey Campanile, MD, FACS General, Bariatric, & Minimally Invasive Surgery [facility]

## 2022-12-30 ENCOUNTER — Other Ambulatory Visit: Payer: Self-pay

## 2022-12-30 ENCOUNTER — Ambulatory Visit (HOSPITAL_BASED_OUTPATIENT_CLINIC_OR_DEPARTMENT_OTHER): Payer: Commercial Managed Care - HMO | Admitting: Anesthesiology

## 2022-12-30 ENCOUNTER — Ambulatory Visit (HOSPITAL_COMMUNITY)
Admission: RE | Admit: 2022-12-30 | Discharge: 2022-12-31 | Disposition: A | Discharge status: Home / Self Care | Payer: Commercial Managed Care - HMO | Attending: General Surgery | Admitting: General Surgery

## 2022-12-30 ENCOUNTER — Encounter (HOSPITAL_COMMUNITY): Payer: Self-pay | Admitting: General Surgery

## 2022-12-30 ENCOUNTER — Ambulatory Visit (HOSPITAL_COMMUNITY): Payer: Commercial Managed Care - HMO | Admitting: Anesthesiology

## 2022-12-30 ENCOUNTER — Encounter (HOSPITAL_COMMUNITY)
Admission: RE | Disposition: A | Discharge status: Home / Self Care | Payer: Self-pay | Source: Home / Self Care | Attending: General Surgery

## 2022-12-30 DIAGNOSIS — Z90710 Acquired absence of both cervix and uterus: Secondary | ICD-10-CM | POA: Diagnosis not present

## 2022-12-30 DIAGNOSIS — D509 Iron deficiency anemia, unspecified: Secondary | ICD-10-CM | POA: Insufficient documentation

## 2022-12-30 DIAGNOSIS — K449 Diaphragmatic hernia without obstruction or gangrene: Secondary | ICD-10-CM

## 2022-12-30 DIAGNOSIS — M8580 Other specified disorders of bone density and structure, unspecified site: Secondary | ICD-10-CM | POA: Diagnosis not present

## 2022-12-30 DIAGNOSIS — K219 Gastro-esophageal reflux disease without esophagitis: Secondary | ICD-10-CM | POA: Diagnosis not present

## 2022-12-30 DIAGNOSIS — Z8719 Personal history of other diseases of the digestive system: Secondary | ICD-10-CM

## 2022-12-30 DIAGNOSIS — G4733 Obstructive sleep apnea (adult) (pediatric): Secondary | ICD-10-CM | POA: Diagnosis not present

## 2022-12-30 DIAGNOSIS — M810 Age-related osteoporosis without current pathological fracture: Secondary | ICD-10-CM | POA: Insufficient documentation

## 2022-12-30 DIAGNOSIS — Z9889 Other specified postprocedural states: Secondary | ICD-10-CM | POA: Diagnosis not present

## 2022-12-30 DIAGNOSIS — Z79899 Other long term (current) drug therapy: Secondary | ICD-10-CM | POA: Diagnosis not present

## 2022-12-30 HISTORY — PX: XI ROBOTIC ASSISTED HIATAL HERNIA REPAIR: SHX6889

## 2022-12-30 HISTORY — PX: UPPER GI ENDOSCOPY: SHX6162

## 2022-12-30 SURGERY — REPAIR, HERNIA, HIATAL, ROBOT-ASSISTED
Anesthesia: General | Site: Mouth

## 2022-12-30 MED ORDER — DEXAMETHASONE SODIUM PHOSPHATE 4 MG/ML IJ SOLN: 4.0000 mg | INTRAMUSCULAR | Status: DC

## 2022-12-30 MED ORDER — CHLORHEXIDINE GLUCONATE 0.12 % MT SOLN
15.0000 mL | Freq: Once | OROMUCOSAL | Status: AC
  Administered 2022-12-30: 15 mL via OROMUCOSAL

## 2022-12-30 MED ORDER — CHLORHEXIDINE GLUCONATE CLOTH 2 % EX PADS
6.0000 | MEDICATED_PAD | Freq: Once | CUTANEOUS | Status: DC
Start: 2022-12-30 — End: 2022-12-30

## 2022-12-30 MED ORDER — LACTATED RINGERS IV SOLN: INTRAVENOUS | Status: DC

## 2022-12-30 MED ORDER — BUPIVACAINE LIPOSOME 1.3 % IJ SUSP
INTRAMUSCULAR | Status: AC
  Filled 2022-12-30: qty 20

## 2022-12-30 MED ORDER — DIPHENHYDRAMINE HCL 50 MG/ML IJ SOLN: 12.5000 mg | Freq: Four times a day (QID) | INTRAMUSCULAR | Status: DC | PRN

## 2022-12-30 MED ORDER — PHENYLEPHRINE HCL (PRESSORS) 10 MG/ML IV SOLN
INTRAVENOUS | Status: DC | PRN
  Administered 2022-12-30 (×2): 80 ug via INTRAVENOUS

## 2022-12-30 MED ORDER — ESCITALOPRAM OXALATE 20 MG PO TABS
10.0000 mg | ORAL_TABLET | Freq: Every day | ORAL | Status: DC
Start: 2022-12-31 — End: 2022-12-31
  Administered 2022-12-31: 10 mg via ORAL
  Filled 2022-12-30: qty 1

## 2022-12-30 MED ORDER — PROPOFOL 10 MG/ML IV BOLUS
INTRAVENOUS | Status: AC
  Filled 2022-12-30: qty 20

## 2022-12-30 MED ORDER — DEXMEDETOMIDINE HCL IN NACL 80 MCG/20ML IV SOLN
INTRAVENOUS | Status: AC
  Filled 2022-12-30: qty 20

## 2022-12-30 MED ORDER — OXYCODONE HCL 5 MG PO TABS
5.0000 mg | ORAL_TABLET | ORAL | Status: DC | PRN
  Administered 2022-12-30: 10 mg via ORAL
  Administered 2022-12-30: 5 mg via ORAL
  Administered 2022-12-31 (×2): 10 mg via ORAL
  Filled 2022-12-30 (×4): qty 2

## 2022-12-30 MED ORDER — ROCURONIUM BROMIDE 10 MG/ML (PF) SYRINGE
PREFILLED_SYRINGE | INTRAVENOUS | Status: AC
  Filled 2022-12-30: qty 10

## 2022-12-30 MED ORDER — LIDOCAINE HCL (CARDIAC) PF 100 MG/5ML IV SOSY
PREFILLED_SYRINGE | INTRAVENOUS | Status: DC | PRN
  Administered 2022-12-30: 60 mg via INTRAVENOUS

## 2022-12-30 MED ORDER — MIDAZOLAM HCL 2 MG/2ML IJ SOLN
INTRAMUSCULAR | Status: AC
  Filled 2022-12-30: qty 2

## 2022-12-30 MED ORDER — CEFAZOLIN SODIUM-DEXTROSE 2-4 GM/100ML-% IV SOLN
2.0000 g | INTRAVENOUS | Status: AC
Start: 2022-12-30 — End: 2022-12-30
  Administered 2022-12-30: 2 g via INTRAVENOUS
  Filled 2022-12-30: qty 100

## 2022-12-30 MED ORDER — DIPHENHYDRAMINE HCL 12.5 MG/5ML PO ELIX: 12.5000 mg | ORAL_SOLUTION | Freq: Four times a day (QID) | ORAL | Status: DC | PRN

## 2022-12-30 MED ORDER — PANTOPRAZOLE SODIUM 40 MG IV SOLR
40.0000 mg | Freq: Every day | INTRAVENOUS | Status: DC
  Administered 2022-12-30: 40 mg via INTRAVENOUS
  Filled 2022-12-30: qty 10

## 2022-12-30 MED ORDER — ALBUMIN HUMAN 5 % IV SOLN: INTRAVENOUS | Status: DC | PRN

## 2022-12-30 MED ORDER — HYDROMORPHONE HCL 1 MG/ML IJ SOLN
0.2500 mg | INTRAMUSCULAR | Status: DC | PRN
  Administered 2022-12-30: 0.5 mg via INTRAVENOUS

## 2022-12-30 MED ORDER — DEXMEDETOMIDINE HCL IN NACL 80 MCG/20ML IV SOLN
INTRAVENOUS | Status: DC | PRN
  Administered 2022-12-30: 8 ug via INTRAVENOUS
  Administered 2022-12-30: 4 ug via INTRAVENOUS

## 2022-12-30 MED ORDER — SIMETHICONE 80 MG PO CHEW
40.0000 mg | CHEWABLE_TABLET | Freq: Four times a day (QID) | ORAL | Status: DC | PRN
  Administered 2022-12-30: 40 mg via ORAL
  Filled 2022-12-30: qty 1

## 2022-12-30 MED ORDER — PHENYLEPHRINE 80 MCG/ML (10ML) SYRINGE FOR IV PUSH (FOR BLOOD PRESSURE SUPPORT)
PREFILLED_SYRINGE | INTRAVENOUS | Status: AC
  Filled 2022-12-30: qty 10

## 2022-12-30 MED ORDER — ONDANSETRON HCL 4 MG/2ML IJ SOLN
INTRAMUSCULAR | Status: DC | PRN
  Administered 2022-12-30: 4 mg via INTRAVENOUS

## 2022-12-30 MED ORDER — HYDROMORPHONE HCL 1 MG/ML IJ SOLN
INTRAMUSCULAR | Status: AC
  Filled 2022-12-30: qty 1

## 2022-12-30 MED ORDER — ENOXAPARIN SODIUM 40 MG/0.4ML IJ SOSY
40.0000 mg | PREFILLED_SYRINGE | INTRAMUSCULAR | Status: DC
  Administered 2022-12-31: 40 mg via SUBCUTANEOUS
  Filled 2022-12-30: qty 0.4

## 2022-12-30 MED ORDER — FENTANYL CITRATE (PF) 250 MCG/5ML IJ SOLN
INTRAMUSCULAR | Status: AC
  Filled 2022-12-30: qty 5

## 2022-12-30 MED ORDER — BUPIVACAINE LIPOSOME 1.3 % IJ SUSP
20.0000 mL | Freq: Once | INTRAMUSCULAR | Status: DC
Start: 2022-12-30 — End: 2022-12-30

## 2022-12-30 MED ORDER — ONDANSETRON HCL 4 MG/2ML IJ SOLN
INTRAMUSCULAR | Status: AC
  Filled 2022-12-30: qty 2

## 2022-12-30 MED ORDER — DEXAMETHASONE SODIUM PHOSPHATE 10 MG/ML IJ SOLN
INTRAMUSCULAR | Status: AC
  Filled 2022-12-30: qty 1

## 2022-12-30 MED ORDER — ALBUMIN HUMAN 5 % IV SOLN
INTRAVENOUS | Status: AC
  Filled 2022-12-30: qty 250

## 2022-12-30 MED ORDER — FENTANYL CITRATE (PF) 100 MCG/2ML IJ SOLN
INTRAMUSCULAR | Status: AC
  Filled 2022-12-30: qty 2

## 2022-12-30 MED ORDER — SCOPOLAMINE 1 MG/3DAYS TD PT72
1.0000 | MEDICATED_PATCH | TRANSDERMAL | Status: DC
  Administered 2022-12-30: 1.5 mg via TRANSDERMAL
  Filled 2022-12-30: qty 1

## 2022-12-30 MED ORDER — KETOROLAC TROMETHAMINE 15 MG/ML IJ SOLN
15.0000 mg | Freq: Three times a day (TID) | INTRAMUSCULAR | Status: DC
  Administered 2022-12-30 – 2022-12-31 (×2): 15 mg via INTRAVENOUS
  Filled 2022-12-30 (×2): qty 1

## 2022-12-30 MED ORDER — KETOROLAC TROMETHAMINE 15 MG/ML IJ SOLN
INTRAMUSCULAR | Status: DC | PRN
  Administered 2022-12-30: 15 mg via INTRAVENOUS

## 2022-12-30 MED ORDER — BUPIVACAINE-EPINEPHRINE 0.25% -1:200000 IJ SOLN
INTRAMUSCULAR | Status: DC | PRN
  Administered 2022-12-30: 30 mL

## 2022-12-30 MED ORDER — ONDANSETRON 4 MG PO TBDP: 4.0000 mg | ORAL_TABLET | Freq: Four times a day (QID) | ORAL | Status: DC | PRN

## 2022-12-30 MED ORDER — DEXAMETHASONE SODIUM PHOSPHATE 10 MG/ML IJ SOLN
INTRAMUSCULAR | Status: DC | PRN
  Administered 2022-12-30: 8 mg via INTRAVENOUS

## 2022-12-30 MED ORDER — BUPIVACAINE LIPOSOME 1.3 % IJ SUSP
INTRAMUSCULAR | Status: DC | PRN
  Administered 2022-12-30: 20 mL

## 2022-12-30 MED ORDER — SUGAMMADEX SODIUM 200 MG/2ML IV SOLN
INTRAVENOUS | Status: DC | PRN
  Administered 2022-12-30: 200 mg via INTRAVENOUS

## 2022-12-30 MED ORDER — LACTATED RINGERS IR SOLN
Status: DC | PRN
  Administered 2022-12-30: 1000 mL

## 2022-12-30 MED ORDER — EPHEDRINE SULFATE (PRESSORS) 50 MG/ML IJ SOLN
INTRAMUSCULAR | Status: DC | PRN
  Administered 2022-12-30: 5 mg via INTRAVENOUS

## 2022-12-30 MED ORDER — LABETALOL HCL 5 MG/ML IV SOLN
INTRAVENOUS | Status: AC
  Filled 2022-12-30: qty 4

## 2022-12-30 MED ORDER — KETOROLAC TROMETHAMINE 30 MG/ML IJ SOLN
INTRAMUSCULAR | Status: AC
  Filled 2022-12-30: qty 1

## 2022-12-30 MED ORDER — KCL IN DEXTROSE-NACL 20-5-0.45 MEQ/L-%-% IV SOLN
INTRAVENOUS | Status: AC
  Filled 2022-12-30: qty 1000

## 2022-12-30 MED ORDER — ROCURONIUM BROMIDE 100 MG/10ML IV SOLN
INTRAVENOUS | Status: DC | PRN
  Administered 2022-12-30: 70 mg via INTRAVENOUS
  Administered 2022-12-30: 20 mg via INTRAVENOUS
  Administered 2022-12-30: 30 mg via INTRAVENOUS

## 2022-12-30 MED ORDER — PROPOFOL 500 MG/50ML IV EMUL
INTRAVENOUS | Status: DC | PRN
  Administered 2022-12-30: 20 ug/kg/min via INTRAVENOUS

## 2022-12-30 MED ORDER — FENTANYL CITRATE (PF) 100 MCG/2ML IJ SOLN
INTRAMUSCULAR | Status: DC | PRN
  Administered 2022-12-30: 50 ug via INTRAVENOUS
  Administered 2022-12-30: 25 ug via INTRAVENOUS
  Administered 2022-12-30: 100 ug via INTRAVENOUS
  Administered 2022-12-30 (×3): 50 ug via INTRAVENOUS
  Administered 2022-12-30: 25 ug via INTRAVENOUS
  Administered 2022-12-30: 50 ug via INTRAVENOUS
  Administered 2022-12-30 (×2): 25 ug via INTRAVENOUS

## 2022-12-30 MED ORDER — ONDANSETRON HCL 4 MG/2ML IJ SOLN: 4.0000 mg | Freq: Four times a day (QID) | INTRAMUSCULAR | Status: DC | PRN

## 2022-12-30 MED ORDER — ACETAMINOPHEN 500 MG PO TABS
1000.0000 mg | ORAL_TABLET | ORAL | Status: AC
  Administered 2022-12-30: 1000 mg via ORAL
  Filled 2022-12-30: qty 2

## 2022-12-30 MED ORDER — BUPIVACAINE-EPINEPHRINE 0.25% -1:200000 IJ SOLN
INTRAMUSCULAR | Status: AC
  Filled 2022-12-30: qty 1

## 2022-12-30 MED ORDER — LACTATED RINGERS IV SOLN: INTRAVENOUS | Status: DC | PRN

## 2022-12-30 MED ORDER — EPHEDRINE 5 MG/ML INJ
INTRAVENOUS | Status: AC
  Filled 2022-12-30: qty 5

## 2022-12-30 MED ORDER — ACETAMINOPHEN 500 MG PO TABS
1000.0000 mg | ORAL_TABLET | Freq: Four times a day (QID) | ORAL | Status: DC
Start: 2022-12-30 — End: 2022-12-31
  Administered 2022-12-30 – 2022-12-31 (×3): 1000 mg via ORAL
  Filled 2022-12-30 (×3): qty 2

## 2022-12-30 MED ORDER — MIDAZOLAM HCL 5 MG/5ML IJ SOLN
INTRAMUSCULAR | Status: DC | PRN
  Administered 2022-12-30 (×2): 1 mg via INTRAVENOUS

## 2022-12-30 MED ORDER — ORAL CARE MOUTH RINSE: 15.0000 mL | Freq: Once | OROMUCOSAL | Status: AC

## 2022-12-30 MED ORDER — MORPHINE SULFATE (PF) 2 MG/ML IV SOLN: 1.0000 mg | INTRAVENOUS | Status: DC | PRN

## 2022-12-30 MED ORDER — PROPOFOL 10 MG/ML IV BOLUS
INTRAVENOUS | Status: DC | PRN
  Administered 2022-12-30: 150 mg via INTRAVENOUS
  Administered 2022-12-30: 50 mg via INTRAVENOUS

## 2022-12-30 MED ORDER — HEPARIN SODIUM (PORCINE) 5000 UNIT/ML IJ SOLN
5000.0000 [IU] | Freq: Once | INTRAMUSCULAR | Status: AC
Start: 2022-12-30 — End: 2022-12-30
  Administered 2022-12-30: 5000 [IU] via SUBCUTANEOUS
  Filled 2022-12-30: qty 1

## 2022-12-30 MED ORDER — SODIUM CHLORIDE 0.9 % IV SOLN: 12.5000 mg | Freq: Four times a day (QID) | INTRAVENOUS | Status: DC | PRN

## 2022-12-30 MED ORDER — LIDOCAINE HCL (PF) 2 % IJ SOLN
INTRAMUSCULAR | Status: AC
  Filled 2022-12-30: qty 5

## 2022-12-30 MED ORDER — LABETALOL HCL 5 MG/ML IV SOLN
INTRAVENOUS | Status: DC | PRN
  Administered 2022-12-30 (×2): 2.5 mg via INTRAVENOUS

## 2022-12-30 SURGICAL SUPPLY — 70 items
ADH SKN CLS APL DERMABOND .7 (GAUZE/BANDAGES/DRESSINGS)
ANTIFOG SOL W/FOAM PAD STRL (MISCELLANEOUS) ×2
APL PRP STRL LF DISP 70% ISPRP (MISCELLANEOUS) ×2
APPLIER CLIP 5 13 M/L LIGAMAX5 (MISCELLANEOUS)
APPLIER CLIP ROT 10 11.4 M/L (STAPLE)
APR CLP MED LRG 11.4X10 (STAPLE)
APR CLP MED LRG 5 ANG JAW (MISCELLANEOUS)
BLADE SURG SZ11 CARB STEEL (BLADE) ×2 IMPLANT
CHLORAPREP W/TINT 26 (MISCELLANEOUS) ×2 IMPLANT
CLIP APPLIE 5 13 M/L LIGAMAX5 (MISCELLANEOUS) IMPLANT
CLIP APPLIE ROT 10 11.4 M/L (STAPLE) IMPLANT
COVER SURGICAL LIGHT HANDLE (MISCELLANEOUS) ×2 IMPLANT
COVER TIP SHEARS 8 DVNC (MISCELLANEOUS) IMPLANT
DERMABOND ADVANCED .7 DNX12 (GAUZE/BANDAGES/DRESSINGS) IMPLANT
DRAIN PENROSE 0.5X18 (DRAIN) IMPLANT
DRAPE ARM DVNC X/XI (DISPOSABLE) ×8 IMPLANT
DRAPE COLUMN DVNC XI (DISPOSABLE) ×2 IMPLANT
DRIVER NDL LRG 8 DVNC XI (INSTRUMENTS) ×2 IMPLANT
DRIVER NDL MEGA SUTCUT DVNCXI (INSTRUMENTS) ×2 IMPLANT
DRIVER NDLE LRG 8 DVNC XI (INSTRUMENTS)
DRIVER NDLE MEGA SUTCUT DVNCXI (INSTRUMENTS) ×2
DRSG TEGADERM 2-3/8X2-3/4 SM (GAUZE/BANDAGES/DRESSINGS) ×12 IMPLANT
ELECT REM PT RETURN 15FT ADLT (MISCELLANEOUS) ×2 IMPLANT
FORCEPS CADIERE DVNC XI (FORCEP) IMPLANT
GAUZE 4X4 16PLY ~~LOC~~+RFID DBL (SPONGE) ×2 IMPLANT
GAUZE SPONGE 2X2 8PLY STRL LF (GAUZE/BANDAGES/DRESSINGS) ×2 IMPLANT
GLOVE BIO SURGEON STRL SZ7.5 (GLOVE) ×4 IMPLANT
GLOVE INDICATOR 8.0 STRL GRN (GLOVE) ×4 IMPLANT
GOWN STRL REUS W/ TWL XL LVL3 (GOWN DISPOSABLE) IMPLANT
GOWN STRL REUS W/TWL XL LVL3 (GOWN DISPOSABLE) ×6
GRASPER SUT TROCAR 14GX15 (MISCELLANEOUS) IMPLANT
GRASPER TIP-UP FEN DVNC XI (INSTRUMENTS) ×2 IMPLANT
IRRIG SUCT STRYKERFLOW 2 WTIP (MISCELLANEOUS) ×2
IRRIGATION SUCT STRKRFLW 2 WTP (MISCELLANEOUS) ×2 IMPLANT
KIT BASIN OR (CUSTOM PROCEDURE TRAY) ×2 IMPLANT
KIT GASTRIC LAVAGE 34FR ADT (SET/KITS/TRAYS/PACK) IMPLANT
KIT TURNOVER KIT A (KITS) IMPLANT
LUBRICANT JELLY K Y 4OZ (MISCELLANEOUS) IMPLANT
MARKER SKIN DUAL TIP RULER LAB (MISCELLANEOUS) IMPLANT
NDL HYPO 22X1.5 SAFETY MO (MISCELLANEOUS) ×2 IMPLANT
NEEDLE HYPO 22X1.5 SAFETY MO (MISCELLANEOUS) ×2
OBTURATOR OPTICAL STND 8 DVNC (TROCAR) ×2
OBTURATOR OPTICALSTD 8 DVNC (TROCAR) ×2 IMPLANT
PACK CARDIOVASCULAR III (CUSTOM PROCEDURE TRAY) ×2 IMPLANT
PAD POSITIONING PINK XL (MISCELLANEOUS) ×2 IMPLANT
SCISSORS LAP 5X35 DISP (ENDOMECHANICALS) IMPLANT
SCISSORS MNPLR CVD DVNC XI (INSTRUMENTS) ×2 IMPLANT
SEAL UNIV 5-12 XI (MISCELLANEOUS) ×6 IMPLANT
SEALER VESSEL EXT DVNC XI (MISCELLANEOUS) ×2 IMPLANT
SOL ELECTROSURG ANTI STICK (MISCELLANEOUS) ×2
SOLUTION ANTFG W/FOAM PAD STRL (MISCELLANEOUS) ×2 IMPLANT
SOLUTION ELECTROSURG ANTI STCK (MISCELLANEOUS) ×2 IMPLANT
SPIKE FLUID TRANSFER (MISCELLANEOUS) ×2 IMPLANT
STRIP CLOSURE SKIN 1/2X4 (GAUZE/BANDAGES/DRESSINGS) ×2 IMPLANT
SUT ETHIBOND 0 36 GRN (SUTURE) ×6 IMPLANT
SUT MNCRL AB 4-0 PS2 18 (SUTURE) ×2 IMPLANT
SUT SILK 0 SH 30 (SUTURE) IMPLANT
SUT SILK 2 0 SH (SUTURE) IMPLANT
SUT VIC AB 0 UR5 27 (SUTURE) IMPLANT
SYR 20ML ECCENTRIC (SYRINGE) ×2 IMPLANT
SYR 20ML LL LF (SYRINGE) ×2 IMPLANT
TIP INNERVISION DETACH 40FR (MISCELLANEOUS) IMPLANT
TIP INNERVISION DETACH 50FR (MISCELLANEOUS) IMPLANT
TIP INNERVISION DETACH 56FR (MISCELLANEOUS) IMPLANT
TOWEL OR 17X26 10 PK STRL BLUE (TOWEL DISPOSABLE) ×2 IMPLANT
TRAY FOLEY MTR SLVR 16FR STAT (SET/KITS/TRAYS/PACK) IMPLANT
TROCAR ADV FIXATION 12X100MM (TROCAR) IMPLANT
TROCAR ADV FIXATION 5X100MM (TROCAR) IMPLANT
TROCAR XCEL NON-BLD 5MMX100MML (ENDOMECHANICALS) ×2 IMPLANT
TUBING INSUFFLATION 10FT LAP (TUBING) ×2 IMPLANT

## 2022-12-30 NOTE — Anesthesia Postprocedure Evaluation (Signed)
Anesthesia Post Note  Patient: Roberta Sims  Procedure(s) Performed: ROBOTIC REPAIR LARGE HIATAL HERNIA WITH WRAP (Abdomen) UPPER GI ENDOSCOPY (Mouth)     Patient location during evaluation: PACU Anesthesia Type: General Level of consciousness: awake and alert Pain management: pain level controlled Vital Signs Assessment: post-procedure vital signs reviewed and stable Respiratory status: spontaneous breathing, nonlabored ventilation, respiratory function stable and patient connected to nasal cannula oxygen Cardiovascular status: blood pressure returned to baseline and stable Postop Assessment: no apparent nausea or vomiting Anesthetic complications: yes  Encounter Notable Events  Notable Event Outcome Phase Comment  Difficult to intubate - expected  Intraprocedure Filed from anesthesia note documentation.    Last Vitals:  Vitals:   12/30/22 1200 12/30/22 1215  BP: 119/80 133/83  Pulse: 88 89  Resp: 12 16  Temp:  36.7 C  SpO2: 94% 97%    Last Pain:  Vitals:   12/30/22 1215  TempSrc:   PainSc: Asleep                 Teshawn Moan,W. EDMOND

## 2022-12-30 NOTE — Anesthesia Procedure Notes (Signed)
Procedure Name: Intubation Date/Time: 12/30/2022 7:51 AM  Performed by: Garth Bigness, CRNAPre-anesthesia Checklist: Patient identified, Emergency Drugs available, Suction available and Patient being monitored Patient Re-evaluated:Patient Re-evaluated prior to induction Oxygen Delivery Method: Circle system utilized Preoxygenation: Pre-oxygenation with 100% oxygen Induction Type: IV induction Ventilation: Mask ventilation without difficulty Laryngoscope Size: Glidescope and 3 Grade View: Grade II Tube type: Oral Tube size: 7.0 mm Number of attempts: 2 Airway Equipment and Method: Rigid stylet and Video-laryngoscopy Placement Confirmation: ETT inserted through vocal cords under direct vision, positive ETCO2 and breath sounds checked- equal and bilateral Secured at: 23 cm Tube secured with: Tape Dental Injury: Teeth and Oropharynx as per pre-operative assessment  Difficulty Due To: Difficulty was anticipated, Difficult Airway- due to anterior larynx, Difficult Airway- due to limited oral opening and Difficult Airway- due to dentition

## 2022-12-30 NOTE — Transfer of Care (Signed)
Immediate Anesthesia Transfer of Care Note  Patient: Roberta Sims [facility]  Procedure(s) Performed: ROBOTIC REPAIR LARGE HIATAL HERNIA WITH WRAP (Abdomen) UPPER GI ENDOSCOPY (Mouth)  Patient Location: PACU  Anesthesia Type:General  Level of Consciousness: awake, alert , oriented, and patient cooperative  Airway & Oxygen Therapy: Patient Spontanous Breathing and Patient connected to face mask oxygen  Post-op Assessment: Report given to RN and Post -op Vital signs reviewed and stable  Post vital signs: Reviewed and stable  Last Vitals:  Vitals Value Taken Time  BP 127/80 12/30/22 1038  Temp 37 C 12/30/22 1038  Pulse 87 12/30/22 1044  Resp 11 12/30/22 1044  SpO2 90 % 12/30/22 1044  Vitals shown include unfiled device data.  Last Pain:  Vitals:   12/30/22 1038  TempSrc:   PainSc: Asleep         Complications:  Encounter Notable Events  Notable Event Outcome Phase Comment  Difficult to intubate - expected  Intraprocedure Filed from anesthesia note documentation.

## 2022-12-30 NOTE — Interval H&P Note (Signed)
History and Physical Interval Note:  12/30/2022 7:24 AM  [facility]  has presented today for surgery, with the diagnosis of LARGE HIATAL HERNIA WITH GERD.  The various methods of treatment have been discussed with the patient and family. After consideration of risks, benefits and other options for treatment, the patient has consented to  Procedure(s) with comments: ROBOTIC REPAIR LARGE HIATAL HERNIA WITH WRAP POSSIBLE MESH (N/A) - 210 UPPER GI ENDOSCOPY (N/A) as a surgical intervention.  The patient's history has been reviewed, patient examined, no change in status, stable for surgery.  I have reviewed the patient's chart and labs.  Questions were answered to the patient's satisfaction.     Gaynelle Adu

## 2022-12-30 NOTE — Progress Notes (Signed)
   12/30/22 2346  BiPAP/CPAP/SIPAP  $ Non-Invasive Home Ventilator  Initial  BiPAP/CPAP/SIPAP Pt Type Adult  BiPAP/CPAP/SIPAP DREAMSTATIOND  Mask Type Nasal pillows  Respiratory Rate 18 breaths/min  Patient Home Equipment No  Auto Titrate Yes (4-16)

## 2022-12-30 NOTE — Op Note (Signed)
PRE-OPERATIVE DIAGNOSIS:  LARGE HIATAL HERNIA   POST-OPERATIVE DIAGNOSIS:  LARGE TYPE III HIATAL HERNIA   PROCEDURE:  Procedure(s): XI ROBOTIC ASSISTED  TYPE III HIATAL HERNIA REPAIR with Nissen fundoplication  AND BILATERAL TAP BLOCK ESOPHAGOGASTRODUODENOSCOPY (EGD)   SURGEON:  Surgeon(s): Gaynelle Adu, MD FACS     ASSISTANTS: Ivar Drape MD FACS   ANESTHESIA:   general   DRAINS: none   EBL: minimal   LOCAL MEDICATIONS USED:  MARCAINE    and OTHER EXPAREL   SPECIMEN:  Source of Specimen:  HERNIA SAC   DISPOSITION OF SPECIMEN:   DISCARDED   COUNTS:  YES   INDICATION FOR PROCEDURE: 52 yo was referred for management of a large type III hiatal hernia.   We had an extensive conversation regarding surgical repair.  We discussed potentially not being able to do a fundoplication if we were unable to achieve adequate intra-abdominal esophageal length.  We also discussed potential mesh insertion.  Please see chart for additional details   PROCEDURE: Patient received 5000 units of subcutaneous heparin preoperatively.  Patient was given Tylenol and Decadron and Zofran preoperatively.  After informed consent was obtained patient was taken to the operating room at [facility].  They were placed upon on the operating room table.  General endotracheal anesthesia was established.  Sequential compression devices were placed.  Usual standard surgical fashion with ChloraPrep.  The patient was on IV antibiotic prior to skin incision.  A surgical timeout was performed.   Access to the abdomen was obtained using the Optiview technique in the left mid abdomen about 8 cm to the left of the umbilicus in the midclavicular line.  Using a 0 degree 5 mm laparoscope through a 5 mm trocar it was advanced through all layers of the abdominal wall and the abdominal cavity was carefully entered.  Pneumoperitoneum was smoothly established without any change in patient vital signs.  The laparoscope was  advanced and the abdominal cavity was surveilled.  There was no evidence of injury.  Patient was placed in reverse Trendelenburg.  A robotic 8 trocar was placed just above to the umbilicus.  An additional 8 mm robotic trocar was placed in the right mid abdomen and a assistant 5 mm trocar was placed in the right lateral abdominal wall.  We exchanged the Optiview trocar for a robotic 8 trocar and a final 8 mm robotic trocar was placed in the left lateral abdominal wall.  A bilateral laparoscopic tap block was performed for postoperative pain relief.  A Nathanson liver retractor was placed through a subxiphoid small incision to lift up the left lobe of the liver providing excellent visualization of the diaphragm.  The [employer] robot was then docked to the trocars.  A tip up grasper in arm 4, vessel sealer in arm 3, camera in arm 2 and a fenestrated bipolar in arm 1.  At this time and went to the surgeon console while my assistant stayed at the bedside.  An assistant was needed to help with tissue retraction and manipulation given the complexity of the case.  The patient had a large type III hiatal hernia.  The GE junction and the proximal stomach was above the diaphragm.  Approximately one third of the stomach was above the diaphragm.     I started my approach along the anterior diaphragm incising the peritoneum with the vessel sealer.  I was able to peel and get into the space of the hernia sac.  I continued  this toward the right crus of the diaphragm.  We identified the plane between the hernia sac and the peritoneum along the right crus.  This was again taken down in sequential fashion using the vessel sealer.  Then with some gentle blunt dissection reduced some of the right sided hernia sac. Pars flacida technique was used.   Some omentum was also reduced from the mediastinum as well.  I then turned my attention and started taking the hernia sac down along the left anterior diaphragm toward the left crus.   We then decided to mobilize and take down some of the short gastric vessels off the proximal greater curvature of the stomach.  These were taken down in sequential fashion all the way up to the cardia and to the left crus.  We then turned our attention posteriorly to the junction of the left and right crus posteriorly.  I was able then to pass a Penrose drain around the retroesophageal area.  My assistant held this as I continued and started circumferential mobilization of the proximal stomach and esophagus in the mediastinum.  The aorta was visualized.  I did see a right vagus nerve.  We did visualize a left vagus nerve.  The left anterior and left plane in the mediastinum was a little bit more adhered or adhesed in this area.  The aorta was to patients right and the esophagus was more to patient's left. However we were able to identify the esophagus easily but did take our time in this area since this plane was a little bit more adhered.  We continue to circumstantial mobilization posteriorly, anteriorly, left and right around the esophagus in the mediastinum.  Again we did  visualize the right vagus and preserve it.   We had achieved about 3 cm of intra-abdominal esophageal length.  This time I debrided some of the hernia sac around the GE junction with the vessel sealer ensuring that we were not on the stomach or on the esophagus.  We returned to the mediastinum and a look to see if there is any additional length that we could obtain.   I then obtained the Olympus endoscope and went to the head of the bed and placed the endoscope in the patient's oropharynx and gently glided it down.  There is no evidence of esophageal injury.  I was able to easily advance the endoscope into the stomach.  I then pulled back on the endoscope and visualized the Z-line.  We transilluminated.  The Z-line was approximately 3 cm below the diaphragm.  Again reinspected the distal and midesophagus and there is no evidence of esophageal  injury.  The stomach was desufflated and the endoscope was withdrawn and removed.  I returned to the surgeon console.   We reduced intra-abdominal pressure to 10 mmHg.  I then reapproximated the left and right crura with 4 interrupted 0 Ethibond sutures.  The diaphragm had come together without undue tension.  The diaphragm crura appeared nice and healthy so I did not feel we needed mesh reinforcement.  The patient had normal preoperative esophageal manometry.  So therefore I decided to do a Nissen fundoplication.  I placed a 56 French lighted bougie.  We watched it come down and put it in the appropriate position.  The fundoplication was then performed over the 56 French bougie.  The posterior fundus at the greater curvature was passed behind the esophagus from left to right. The anterior fundus was grasped 2 cm from the greater curvature and  3 cm distal to the gastroesophageal junction. It was then brought in front of the esophagus to join the posterior fundus at the 10 o'clock position on the anterior aspect of the esophagus. A shoeshine maneuver was performed. Three  seromuscular sutures were placed from left to right in 3 structures. First is the anterior fundus, second is a seromuscular bite of the esophagus, and lastly in the posterior fundus. The wrap covered about a total of 3 cm of the intra-abdominal esophagus.  The bougie was removed.  The Nissen was floppy as anticipated.    The stomach looked to be laying in the correct orientation.  Sutures were removed.  Went back to the head of the bed and placed the Olympus endoscope back in the patient's oropharynx and guided it down the esophagus.  There was a trace amount of blood in the distal esophagus probably where I placed the seromuscular suture and/or from where the bougie was passed.  I did not see any defect in the esophagus.  The scope was advanced to the stomach.  The stomach was insufflated and the scope was retroflexed.  This revealed a  360 degree wrap.  Hill grade valve 1 appearance.  The stomach was desufflated and the scope was pulled back and withdrawn.  Liver retractor was removed.  There is no evidence of injury to the left lobe of the liver.  The robot was undocked.  I scrubbed back in.  Trocars removed and the skin incisions were closed with a 4 Monocryl followed by the application of Dermabond.  All needle, instrument, and sponge counts were correct x 2.  There were no immediate complications.  Patient tolerated suture well.  Patient was extubated and taken recovery room in stable condition   PLAN OF CARE: Admit for overnight observation   PATIENT DISPOSITION:  PACU - hemodynamically stable.   Delay start of Pharmacological VTE agent (>24hrs) due to surgical blood loss or risk of bleeding:  no  Mary Sella. Andrey Campanile, MD, FACS General, Bariatric, & Minimally Invasive Surgery [facility],  [facility]

## 2022-12-31 ENCOUNTER — Encounter: Payer: Self-pay | Admitting: Family

## 2022-12-31 ENCOUNTER — Other Ambulatory Visit (HOSPITAL_COMMUNITY): Payer: Self-pay

## 2022-12-31 ENCOUNTER — Ambulatory Visit (HOSPITAL_COMMUNITY): Payer: Commercial Managed Care - HMO

## 2022-12-31 ENCOUNTER — Encounter (HOSPITAL_COMMUNITY): Payer: Self-pay | Admitting: General Surgery

## 2022-12-31 DIAGNOSIS — K449 Diaphragmatic hernia without obstruction or gangrene: Secondary | ICD-10-CM | POA: Diagnosis not present

## 2022-12-31 LAB — CBC
HCT: 36.8 % (ref 36.0–46.0)
Hemoglobin: 11.8 g/dL — ABNORMAL LOW (ref 12.0–15.0)
MCH: 32.3 pg (ref 26.0–34.0)
MCHC: 32.1 g/dL (ref 30.0–36.0)
MCV: 100.8 fL — ABNORMAL HIGH (ref 80.0–100.0)
Platelets: 171 10*3/uL (ref 150–400)
RBC: 3.65 MIL/uL — ABNORMAL LOW (ref 3.87–5.11)
RDW: 13.3 % (ref 11.5–15.5)
WBC: 10.2 10*3/uL (ref 4.0–10.5)
nRBC: 0 % (ref 0.0–0.2)

## 2022-12-31 LAB — BASIC METABOLIC PANEL
Anion gap: 9 (ref 5–15)
BUN: 10 mg/dL (ref 6–20)
CO2: 24 mmol/L (ref 22–32)
Calcium: 8.6 mg/dL — ABNORMAL LOW (ref 8.9–10.3)
Chloride: 102 mmol/L (ref 98–111)
Creatinine, Ser: 0.61 mg/dL (ref 0.44–1.00)
GFR, Estimated: >60 mL/min (ref >60)
Glucose, Bld: 123 mg/dL — ABNORMAL HIGH (ref 70–99)
Potassium: 3.8 mmol/L (ref 3.5–5.1)
Sodium: 135 mmol/L (ref 135–145)

## 2022-12-31 LAB — MAGNESIUM: Magnesium: 2.2 mg/dL (ref 1.7–2.4)

## 2022-12-31 MED ORDER — OXYCODONE HCL 5 MG PO TABS
5.0000 mg | ORAL_TABLET | Freq: Four times a day (QID) | ORAL | 0 refills | Status: DC | PRN
  Filled 2022-12-31: qty 15, 4d supply, fill #0

## 2022-12-31 MED ORDER — ONDANSETRON HCL 4 MG PO TABS
4.0000 mg | ORAL_TABLET | Freq: Three times a day (TID) | ORAL | 1 refills | Status: DC | PRN
  Filled 2022-12-31: qty 20, 7d supply, fill #0

## 2022-12-31 MED ORDER — IOHEXOL 300 MG/ML  SOLN
50.0000 mL | Freq: Once | INTRAMUSCULAR | Status: AC | PRN
  Administered 2022-12-31: 50 mL via ORAL

## 2022-12-31 MED ORDER — ACETAMINOPHEN 500 MG PO TABS: 1000.0000 mg | ORAL_TABLET | Freq: Three times a day (TID) | ORAL | Status: AC

## 2022-12-31 MED ORDER — METHOCARBAMOL 500 MG PO TABS
500.0000 mg | ORAL_TABLET | Freq: Three times a day (TID) | ORAL | 0 refills | Status: DC | PRN
  Filled 2022-12-31: qty 30, 10d supply, fill #0

## 2022-12-31 NOTE — Discharge Summary (Signed)
Physician Discharge Summary  Roberta Sims ZOX:[id number] DOB: [birth year] DOA: 12/30/2022  PCP: Gerre Scull, NP  Admit date: 12/30/2022 Discharge date: 12/31/2022  Recommendations for Outpatient Follow-up:     Follow-up Information     Gaynelle Adu, MD. Schedule an appointment as soon as possible for a visit in 3 week(s).   Specialty: General Surgery Why: For wound re-check Contact information: [address] [place] [address] [phone]                Discharge Diagnoses:  Hiatal hernia with gerd H/o iron deficiency H/o cameron ulcers osa  Surgical Procedure: XI ROBOTIC ASSISTED  TYPE III HIATAL HERNIA REPAIR with Nissen fundoplication  AND BILATERAL TAP BLOCK ESOPHAGOGASTRODUODENOSCOPY (EGD)  Discharge Condition: good Disposition: home  Diet recommendation: full liquid  Filed Weights   12/30/22 0542  Weight: 68.9 kg    History of present illness:    Hospital Course:  Pt came in for planned procedure above due to h/o hiatal hernia with heartburn, iron deficiency from cameron erosions. Kept overnight for observation and to make sure tolerate liquids. On pod 1 she was afebrile, without tachycardia, no vomiting, normal WBC. Tolerating liquids. UGI was as expected. Felt safe for dc. Discussed dc instructions in great detail  BP (!) 142/85 (BP Location: Right Arm)   Pulse 71   Temp 97.7 F (36.5 C)   Resp 16   Ht 5\' 9"  (1.753 m)   Wt 68.9 kg   SpO2 95%   BMI 22.45 kg/m   Gen: alert, NAD, non-toxic appearing Pupils: equal, no scleral icterus Pulm:  symmetric chest rise CV: regular rate and rhythm Abd: soft, min tender, nondistended. Scant irritation around all trocar sites with a little bruising.  No cellulitis. No incisional hernia Ext: no edema, no calf tenderness Skin: no rash, no jaundice    Discharge Instructions  Discharge Instructions     Call MD for:   Complete by: As directed    Temperature >101   Call MD  for:  hives   Complete by: As directed    Call MD for:  persistant dizziness or light-headedness   Complete by: As directed    Call MD for:  persistant nausea and vomiting   Complete by: As directed    Call MD for:  redness, tenderness, or signs of infection (pain, swelling, redness, odor or green/yellow discharge around incision site)   Complete by: As directed    Call MD for:  severe uncontrolled pain   Complete by: As directed    Diet full liquid   Complete by: As directed    Discharge instructions   Complete by: As directed    See [facility] discharge instructions   Increase activity slowly   Complete by: As directed       Allergies as of 12/31/2022       Reactions   Meloxicam Other (See Comments)   Headache        Medication List     STOP taking these medications    cyclobenzaprine 10 MG tablet Commonly known as: FLEXERIL   phenazopyridine 100 MG tablet Commonly known as: Pyridium       TAKE these medications    acetaminophen 500 MG tablet Commonly known as: TYLENOL Take 2 tablets (1,000 mg total) by mouth every 8 (eight) hours for 5 days.   cetirizine 10 MG tablet Commonly known as: ZYRTEC Take 10 mg by mouth daily.   escitalopram 10 MG tablet Commonly  known as: LEXAPRO Take 1 tablet (10 mg total) by mouth daily.   methocarbamol 500 MG tablet Commonly known as: ROBAXIN Take 1 tablet (500 mg total) by mouth every 8 (eight) hours as needed for muscle spasms.   NON FORMULARY Cpap machine nightly   omeprazole 40 MG capsule Commonly known as: PRILOSEC TAKE ONE CAPSULE BY MOUTH TWICE A DAY   ondansetron 4 MG tablet Commonly known as: Zofran Take 1 tablet (4 mg total) by mouth every 8 (eight) hours as needed for nausea or vomiting.   oxyCODONE 5 MG immediate release tablet Commonly known as: Oxy IR/ROXICODONE Take 1 tablet (5 mg total) by mouth every 6 (six) hours as needed for severe pain (pain score 7-10).   rosuvastatin 20 MG tablet Commonly  known as: Crestor Take 1 tablet (20 mg total) by mouth daily.   vitamin C 100 MG tablet Take 100 mg by mouth daily.   zinc gluconate 50 MG tablet Take 50 mg by mouth daily.   zolpidem 5 MG tablet Commonly known as: AMBIEN Take 1 tablet (5 mg total) by mouth at bedtime as needed for sleep.        Follow-up Information     Gaynelle Adu, MD. Schedule an appointment as soon as possible for a visit in 3 week(s).   Specialty: General Surgery Why: For wound re-check Contact information: [address] [place] [address] [phone]                  The results of significant diagnostics from this hospitalization (including imaging, microbiology, ancillary and laboratory) are listed below for reference.    Significant Diagnostic Studies: DG UGI W SINGLE CM (SOL OR THIN BA)  Result Date: 12/31/2022 CLINICAL DATA:  Patient is 1 day status post paraesophageal hernia repair. EXAM: DG UGI W SINGLE CM TECHNIQUE: Scout radiograph was obtained. Single contrast examination was performed using water soluble contrast. This exam was performed by Mina Marble, PA-C , and was supervised and interpreted by Dr. Roque Lias. A limited problem focused exam was conducted today. FLUOROSCOPY: Radiation Exposure Index (as provided by the fluoroscopic device): 5.2 mGy Kerma COMPARISON:  None Available. FINDINGS: Scout Radiograph: No abnormal bowel dilatation. Esophagus: Distal esophageal narrowing, most likely representing postoperative edematous changes. No postoperative contrast leakage observed. Esophageal motility:  Within normal limits. Gastroesophageal reflux:  None visualized. Ingested 13mm barium tablet:  Not given. Stomach: Normal appearance. No hiatal hernia. Gastric emptying: Normal. Duodenum:  Normal appearance. Other:  None. IMPRESSION: No postoperative contrast leakage observed today. Otherwise unremarkable postoperative upper GI examination. Electronically Signed   By:  Lupita Raider M.D.   On: 12/31/2022 09:37    Microbiology: No results found for this or any previous visit (from the past 240 hour(s)).   Labs: Basic Metabolic Panel: Recent Labs  Lab 12/26/22 0936 12/31/22 0431  NA 137 135  K 4.4 3.8  CL 103 102  CO2 27 24  GLUCOSE 101* 123*  BUN 19 10  CREATININE 0.42* 0.61  CALCIUM 9.2 8.6*  MG  --  2.2   Liver Function Tests: Recent Labs  Lab 12/26/22 0936  AST 21  ALT 19  ALKPHOS 89  BILITOT 0.8  PROT 7.3  ALBUMIN 4.2   No results for input(s): "LIPASE", "AMYLASE" in the last 168 hours. No results for input(s): "AMMONIA" in the last 168 hours. CBC: Recent Labs  Lab 12/25/22 1435 12/31/22 0431  WBC 7.1 10.2  NEUTROABS 3.5  --  HGB 13.3 11.8*  HCT 39.5 36.8  MCV 95.0 100.8*  PLT 211 171   Cardiac Enzymes: No results for input(s): "CKTOTAL", "CKMB", "CKMBINDEX", "TROPONINI" in the last 168 hours. BNP: BNP (last 3 results) No results for input(s): "BNP" in the last 8760 hours.  ProBNP (last 3 results) No results for input(s): "PROBNP" in the last 8760 hours.  CBG: No results for input(s): "GLUCAP" in the last 168 hours.  Principal Problem:   S/P repair of paraesophageal hernia   Time coordinating discharge: 20 min  Signed:  Atilano Ina, MD [facility],  A [facility] [phone] 12/31/2022, 1:49 PM

## 2022-12-31 NOTE — Discharge Instructions (Signed)
EATING AFTER YOUR ESOPHAGEAL SURGERY (Stomach Fundoplication, Hiatal Hernia repair, Achalasia surgery, etc)  ######################################################################  EAT Start with a full liquid diet (see below) Gradually transition to a high fiber diet with a fiber supplement over the next month after discharge.    WALK Walk throughout the day.  Control your pain to do that.    CONTROL PAIN Control pain so that you can walk, sleep, tolerate sneezing/coughing, go up/down stairs.  HAVE A BOWEL MOVEMENT DAILY Keep your bowels regular to avoid problems.  OK to try a laxative (like miralax) to override constipation.  OK to use an antidairrheal to slow down diarrhea.  Call if not better after 2 tries  CALL IF YOU HAVE PROBLEMS/CONCERNS Call if you are still struggling despite following these instructions. Call if you have concerns not answered by these instructions  ######################################################################   After your esophageal surgery, expect some sticking with swallowing over the next 1-2 months.    If food sticks when you eat, it is called "dysphagia".  This is due to swelling around your esophagus at the wrap & hiatal diaphragm repair.  It will gradually ease off over the next few months.  To help you through this temporary phase, we start you out on a full liquid diet.  Your first meal in the hospital was thin liquids.  You should have been given a full liquid diet by the time you left the hospital. Stay on clears and full liquids for the first week. Some patients may need to stay on a liquid diet for up to 2 weeks if having trouble swallowing.  Once tolerating that well, you can advance to pureed diet.   We ask patients to stay on a pureed diet for the 2nd-3rd week to avoid anything getting "stuck" near your recent surgery.  Don't be alarmed if your ability to swallow doesn't progress according to this plan.  Everyone is different and some  diets can advance more or less quickly.    It is often helpful to crush your medications or split them as they can sometimes stick, especially the first week or so.   Some BASIC RULES to follow are: Maintain an upright position whenever eating or drinking. Try alkaline water if you having issues with water Take small bites - just a teaspoon size bite at a time. Eat slowly.  It may also help to eat only one food at a time. Consider nibbling through smaller, more frequent meals & avoid the urge to eat BIG meals Do not push through feelings of fullness, nausea, or bloatedness Do not mix solid foods and liquids in the same mouthful Try not to "wash foods down" with large gulps of liquids. Avoid carbonated (bubbly/fizzy) drinks.   Avoid foods that make you feel gassy or bloated.  Start with bland foods first.  Wait on trying greasy, fried, or spicy meals until you are tolerating more bland solids well. Understand that it will be hard to burp and belch at first.  This gradually improves with time.  Expect to be more gassy/flatulent/bloated initially.  Walking will help your body manage it better. Consider using medications for bloating that contain simethicone such as  Maalox or Gas-X  Consider crushing her medications, especially smaller pills.  The ability to swallow pills should get easier after a few weeks Eat in a relaxed atmosphere & minimize distractions. Avoid talking while eating.   Do not use straws. Following each meal, sit in an upright position (90 degree angle) for 60  to 90 minutes.  Going for a short walk can help as well If food does stick, don't panic.  Try to relax and let the food pass on its own.  Sipping WARM LIQUID such as strong hot black tea can also help slide it down.   Be gradual in changes & use common sense:  -If you easily tolerating a certain "level" of foods, advance to the next level gradually -If you are having trouble swallowing a particular food, then avoid  it.   -If food is sticking when you advance your diet, go back to thinner previous diet (the lower LEVEL) for 1-2 days.  LEVEL 2 = PUREED DIET  Start 1- 2 WEEKS AFTER SURGERY IF YOU ARE TOLERATING A FULL LIQUID DIET EASILY  -Foods in this group are pureed or blenderized to a smooth, mashed potato-like consistency.  -If necessary, the pureed foods can keep their shape with the addition of a thickening agent.   -Meat should be pureed to a smooth, pasty consistency.  Hot broth or gravy may be added to the pureed meat, approximately 1 oz. of liquid per 3 oz. serving of meat. -CAUTION:  If any foods do not puree into a smooth consistency, swallowing will be more difficult.  (For example, nuts or seeds sometimes do not blend well.)  Hot Foods Cold Foods  Pureed scrambled eggs and cheese Pureed cottage cheese  Baby cereals Thickened juices and nectars  Thinned cooked cereals (no lumps) Thickened milk or eggnog  Pureed [place] toast or pancakes Ensure  Mashed potatoes Ice cream  Pureed parsley, au gratin, scalloped potatoes, candied sweet potatoes Fruit or [place] ice, sherbet  Pureed buttered or alfredo noodles Plain yogurt  Pureed vegetables (no corn or peas) Instant breakfast  Pureed soups and creamed soups Smooth pudding, mousse, custard  Pureed scalloped apples Whipped gelatin  Gravies Sugar, syrup, honey, jelly  Sauces, cheese, tomato, barbecue, white, creamed Cream  Any baby food Creamer  Alcohol in moderation (not beer or champagne) Margarine  Coffee or tea Mayonnaise   Ketchup, mustard   Apple sauce   SAMPLE MENU:  PUREED DIET Breakfast Lunch Dinner  Orange juice, 1/2 cup Cream of wheat, 1/2 cup Pineapple juice, 1/2 cup Pureed [place], barley soup, 3/4 cup Pureed Hawaiian chicken, 3 oz  Scrambled eggs, mashed or blended with cheese, 1/2 cup Tea or coffee, 1 cup  Whole milk, 1 cup  Non-dairy creamer, 2 Tbsp. Mashed potatoes, 1/2 cup Pureed cooled broccoli, 1/2 cup Apple sauce,  1/2 cup Coffee or tea Mashed potatoes, 1/2 cup Pureed spinach, 1/2 cup Frozen yogurt, 1/2 cup Tea or coffee      LEVEL 3 = SOFT DIET  After your first 4 weeks, you can advance to a soft diet.   Keep on this diet until everything goes down easily.  Hot Foods Cold Foods  White fish Cottage cheese  Stuffed fish Junior baby fruit  Baby food meals Semi thickened juices  Minced soft cooked, scrambled, poached eggs nectars  Souffle & omelets Ripe mashed bananas  Cooked cereals Canned fruit, pineapple sauce, milk  potatoes Milkshake  Buttered or Alfredo noodles Custard  Cooked cooled vegetable Puddings, including tapioca  Sherbet Yogurt  Vegetable soup or alphabet soup Fruit ice, [place] ice  Gravies Whipped gelatin  Sugar, syrup, honey, jelly Junior baby desserts  Sauces:  Cheese, creamed, barbecue, tomato, white Cream  Coffee or tea Margarine   SAMPLE MENU:  LEVEL 3 Breakfast Lunch Dinner  Orange juice, 1/2 cup Oatmeal,  1/2 cup Scrambled eggs with cheese, 1/2 cup Decaffeinated tea, 1 cup Whole milk, 1 cup Non-dairy creamer, 2 Tbsp Pineapple juice, 1/2 cup Minced beef, 3 oz Gravy, 2 Tbsp Mashed potatoes, 1/2 cup Minced fresh broccoli, 1/2 cup Applesauce, 1/2 cup Coffee, 1 cup [place], barley soup, 3/4 cup Minced Hawaiian chicken, 3 oz Mashed potatoes, 1/2 cup Cooked spinach, 1/2 cup Frozen yogurt, 1/2 cup Non-dairy creamer, 2 Tbsp      LEVEL 4 = CHOPPED DIET  -After all the foods in level 3 (soft diet) are passing through well you should advance up to more chopped foods.  -It is still important to cut these foods into small pieces and eat slowly.  Hot Foods Cold Foods  Poultry Cottage cheese  Chopped Swedish meatballs Yogurt  Meat salads (ground or flaked meat) Milk  Flaked fish (tuna) Milkshakes  Poached or scrambled eggs Soft, cold, dry cereal  Souffles and omelets Fruit juices or nectars  Cooked cereals Chopped canned fruit  Chopped [place] toast or  pancakes Canned fruit cocktail  Noodles or pasta (no rice) Pudding, mousse, custard  Cooked vegetables (no frozen peas, corn, or mixed vegetables) Green salad  Canned small sweet peas Ice cream  Creamed soup or vegetable soup Fruit ice, [place] ice  Pureed vegetable soup or alphabet soup Non-dairy creamer  Ground scalloped apples Margarine  Gravies Mayonnaise  Sauces:  Cheese, creamed, barbecue, tomato, white Ketchup  Coffee or tea Mustard   SAMPLE MENU:  LEVEL 4 Breakfast Lunch Dinner  Orange juice, 1/2 cup Oatmeal, 1/2 cup Scrambled eggs with cheese, 1/2 cup Decaffeinated tea, 1 cup Whole milk, 1 cup Non-dairy creamer, 2 Tbsp Ketchup, 1 Tbsp Margarine, 1 tsp Salt, 1/4 tsp Sugar, 2 tsp Pineapple juice, 1/2 cup Ground beef, 3 oz Gravy, 2 Tbsp Mashed potatoes, 1/2 cup Cooked spinach, 1/2 cup Applesauce, 1/2 cup Decaffeinated coffee Whole milk Non-dairy creamer, 2 Tbsp Margarine, 1 tsp Salt, 1/4 tsp Pureed [place], barley soup, 3/4 cup Barbecue chicken, 3 oz Mashed potatoes, 1/2 cup Ground fresh broccoli, 1/2 cup Frozen yogurt, 1/2 cup Decaffeinated tea, 1 cup Non-dairy creamer, 2 Tbsp Margarine, 1 tsp Salt, 1/4 tsp Sugar, 1 tsp    LEVEL 5:  REGULAR FOODS  -Foods in this group are soft, moist, regularly textured foods.   -This level includes meat and breads, which tend to be the hardest things to swallow.   -Eat very slowly, chew well and continue to avoid carbonated drinks. -most people are at this level in 6 weeks  Hot Foods Cold Foods  Baked fish or skinned Soft cheeses - cottage cheese  Souffles and omelets Cream cheese  Eggs Yogurt  Stuffed shells Milk  Spaghetti with meat sauce Milkshakes  Cooked cereal Cold dry cereals (no nuts, dried fruit, coconut)  [place] toast or pancakes Crackers  Buttered toast Fruit juices or nectars  Noodles or pasta (no rice) Canned fruit  Potatoes (all types) Ripe bananas  Soft, cooked vegetables (no corn, lima, or baked  beans) Peeled, ripe, fresh fruit  Creamed soups or vegetable soup Cakes (no nuts, dried fruit, coconut)  Canned chicken noodle soup Plain doughnuts  Gravies Ice cream  Bacon dressing Pudding, mousse, custard  Sauces:  Cheese, creamed, barbecue, tomato, white Fruit ice, [place] ice, sherbet  Decaffeinated tea or coffee Whipped gelatin  Pork chops Regular gelatin   Canned fruited gelatin molds   Sugar, syrup, honey, jam, jelly   Cream   Non-dairy   Margarine   Oil   Mayonnaise  Ketchup   Mustard   TROUBLESHOOTING IRREGULAR BOWELS  1) Avoid extremes of bowel movements (no bad constipation/diarrhea)  2) Miralax 17gm mixed in 8oz. water or juice-daily. May use BID as needed.  3) Gas-x,Phazyme, etc. as needed for gas & bloating.  4) Soft,bland diet. No spicy,greasy,fried foods.  5) Prilosec over-the-counter as needed  6) May hold gluten/wheat products from diet to see if symptoms improve.  7) May try probiotics (Align, Activa, etc) to help calm the bowels down  7) If symptoms become worse call back immediately.    If you have any questions please call our office at [facility]: [phone].

## 2022-12-31 NOTE — Progress Notes (Signed)
   12/31/22 1107  TOC Brief Assessment  Insurance and Status Reviewed  Patient has primary care physician Yes  Home environment has been reviewed home with spouse  Prior level of function: independent  Prior/Current Home Services No current home services  Social Determinants of Health Reivew SDOH reviewed no interventions necessary  Readmission risk has been reviewed Yes  Transition of care needs no transition of care needs at this time

## 2022-12-31 NOTE — Progress Notes (Signed)
Discharge instructions given to patient and all questions were answered.  

## 2023-01-03 ENCOUNTER — Ambulatory Visit: Payer: Commercial Managed Care - HMO | Admitting: Family Medicine

## 2023-01-06 ENCOUNTER — Ambulatory Visit: Payer: Managed Care, Other (non HMO) | Admitting: Family Medicine

## 2023-01-06 DIAGNOSIS — M810 Age-related osteoporosis without current pathological fracture: Secondary | ICD-10-CM

## 2023-01-06 MED ORDER — ROMOSOZUMAB-AQQG 105 MG/1.17ML ~~LOC~~ SOSY
210.0000 mg | PREFILLED_SYRINGE | Freq: Once | SUBCUTANEOUS | Status: AC
  Administered 2023-01-06: 210 mg via SUBCUTANEOUS

## 2023-01-06 NOTE — Progress Notes (Signed)
Patient is here for evenity injection #6. Patient received bilateral arm [place] evenity injections today. She tolerated injections well. She will return in 1 month for her next evenity injection.

## 2023-01-27 ENCOUNTER — Ambulatory Visit (INDEPENDENT_AMBULATORY_CARE_PROVIDER_SITE_OTHER): Payer: Commercial Managed Care - HMO

## 2023-01-27 ENCOUNTER — Ambulatory Visit: Payer: Commercial Managed Care - HMO | Admitting: Family Medicine

## 2023-01-28 ENCOUNTER — Encounter: Payer: Self-pay | Admitting: Family

## 2023-01-30 ENCOUNTER — Encounter: Payer: Self-pay | Admitting: Family

## 2023-02-04 ENCOUNTER — Ambulatory Visit: Payer: Commercial Managed Care - HMO | Admitting: Family Medicine

## 2023-02-06 ENCOUNTER — Ambulatory Visit: Payer: Commercial Managed Care - HMO | Admitting: Family Medicine

## 2023-02-06 VITALS — Ht 69.0 in

## 2023-02-06 DIAGNOSIS — M810 Age-related osteoporosis without current pathological fracture: Secondary | ICD-10-CM

## 2023-02-06 MED ORDER — ROMOSOZUMAB-AQQG 105 MG/1.17ML ~~LOC~~ SOSY
210.0000 mg | PREFILLED_SYRINGE | Freq: Once | SUBCUTANEOUS | Status: AC
  Administered 2023-02-06: 210 mg via SUBCUTANEOUS

## 2023-02-06 NOTE — Progress Notes (Signed)
Patient is here for evenity injection #7. Patient received bilateral arm [place] evenity injections today. She tolerated injections well. She will return in 1 month for her next evenity injection.

## 2023-02-10 ENCOUNTER — Ambulatory Visit: Payer: Commercial Managed Care - HMO | Admitting: Family Medicine

## 2023-02-10 NOTE — Progress Notes (Unsigned)
DATE OF VISIT: 02/10/2023        Roberta Sims DOB: September 16, 1970 MRN: [id number]  CC:  Left hip pain  History of present Illness: Roberta Sims is a 52 y.o. female who presents for evaluation of Lt hip pain PMH significant for: - L4 compression fracture noted as subacute on MRI L-spine 07/08/2022 -was following with Dr. Clare Gandy in our [facility] office at that time.  Symptoms started March 2024 after catching her grandchild -Nondisplaced left pubic body fracture with surrounding marrow edema as seen on MRI pelvis 07/08/2022-was following with Dr. Clare Gandy in our [facility] office at that time.  Symptoms started March 2024 after catching her grandchild -Osteoporosis -currently undergoing treatment with Evenity in our office, has completed 7 of 12 injections, last 02/06/2023 Prior visit notes from 05/23/2022 reviewed today and summarized as above  Today she reports ***  Medications:  Outpatient Encounter Medications as of 02/10/2023  Medication Sig   Ascorbic Acid (VITAMIN C) 100 MG tablet Take 100 mg by mouth daily.   cetirizine (ZYRTEC) 10 MG tablet Take 10 mg by mouth daily.   escitalopram (LEXAPRO) 10 MG tablet Take 1 tablet (10 mg total) by mouth daily.   methocarbamol (ROBAXIN) 500 MG tablet Take 1 tablet (500 mg total) by mouth every 8 (eight) hours as needed for muscle spasms.   NON FORMULARY Cpap machine nightly   omeprazole (PRILOSEC) 40 MG capsule TAKE ONE CAPSULE BY MOUTH TWICE A DAY   ondansetron (ZOFRAN) 4 MG tablet Take 1 tablet (4 mg total) by mouth every 8 (eight) hours as needed for nausea or vomiting.   oxyCODONE (OXY IR/ROXICODONE) 5 MG immediate release tablet Take 1 tablet (5 mg total) by mouth every 6 (six) hours as needed for severe pain (pain score 7-10).   rosuvastatin (CRESTOR) 20 MG tablet Take 1 tablet (20 mg total) by mouth daily.   zinc gluconate 50 MG tablet Take 50 mg by mouth daily.   zolpidem (AMBIEN) 5 MG tablet Take 1 tablet (5 mg total)  by mouth at bedtime as needed for sleep.   No facility-administered encounter medications on file as of 02/10/2023.    Allergies: is allergic to meloxicam.  Physical Examination: Vitals: There were no vitals taken for this visit. GENERAL:  Roberta Sims is a 52 y.o. female appearing their stated age, alert and oriented x 3, in no apparent distress.  SKIN: no rashes or lesions, skin clean, dry, intact MSK: *** NEURO: sensation intact to light touch, DTR *** VASC: pulses 2+ and symmetric *** bilaterally, no edema  Radiology: MRI:  MRI Pelvis 07/03/22 showing: IMPRESSION: 1. Nondisplaced fracture of the left pubic body with surrounding mild bone marrow edema. 2. No hip fracture, dislocation or avascular necrosis.  MRI Lspine 07/03/22 showing: IMPRESSION: 1. Subacute L4 inferior endplate fracture with mild depression. Mild periarticular edema around the right L4-5 facet which could be traumatic or degenerative. 2. Thoracolumbar fusion with accentuated disc degeneration at the adjacent L2-3 disc space. No neural impingement.  Assessment & Plan    Assessment: 1. ***  Plan: 1. *** ***. The patient should return to see me ***.  If not significantly improved we will consider ***.  Patient encouraged to call with any questions or concerns. ***. Patient expressed understanding & agreement with above.  No diagnosis found.  No orders of the defined types were placed in this encounter.

## 2023-02-15 NOTE — Telephone Encounter (Signed)
VOB request submitted for 2025.

## 2023-02-24 ENCOUNTER — Encounter: Payer: Self-pay | Admitting: Family Medicine

## 2023-02-24 ENCOUNTER — Ambulatory Visit (HOSPITAL_BASED_OUTPATIENT_CLINIC_OR_DEPARTMENT_OTHER)
Admission: RE | Admit: 2023-02-24 | Discharge: 2023-02-24 | Disposition: A | Discharge status: Home / Self Care | Payer: Commercial Managed Care - HMO | Source: Ambulatory Visit | Attending: Family Medicine | Admitting: Family Medicine

## 2023-02-24 ENCOUNTER — Ambulatory Visit (INDEPENDENT_AMBULATORY_CARE_PROVIDER_SITE_OTHER): Payer: Commercial Managed Care - HMO | Admitting: Family Medicine

## 2023-02-24 VITALS — BP 126/86 | Ht 69.0 in | Wt 135.0 lb

## 2023-02-24 DIAGNOSIS — M5441 Lumbago with sciatica, right side: Secondary | ICD-10-CM

## 2023-02-24 DIAGNOSIS — M5442 Lumbago with sciatica, left side: Secondary | ICD-10-CM

## 2023-02-24 MED ORDER — METHOCARBAMOL 500 MG PO TABS: 500.0000 mg | ORAL_TABLET | Freq: Three times a day (TID) | ORAL | 0 refills | Status: DC | PRN

## 2023-02-24 NOTE — Progress Notes (Signed)
CHIEF COMPLAINT: No chief complaint on file.  _____________________________________________________________ SUBJECTIVE  HPI  Pt is a 52 y.o. female here for evaluation of lower back and hip pain  Hx notable for osteoporosis, has been receiving Evenity injections, history of stress fractures  Picked up granddaughter 02/18/23 while standing straight up, estimated 50lb, and felt pain in the base of her back. (Lower lumbar region). States pain feels identical to the multiple times she has sustained fractures. Shares that she has acquired fractures from this same mechanism, and states usually the pain will begin to build to an unbearable pain. Has preemptively been treating with advil, has deferred stretching. Pain bilateral lumbar/hip, R more severe than L, now radiating up her back as well. Of note, has received orthopedic care through [facility] for several stress fractures. Denies urinary or bowel incontinence. + sciatic pain  ------------------------------------------------------------------------------------------------------ Past Medical History:  Diagnosis Date   Anemia    Anxiety    Complication of anesthesia    Compression fracture of thoracic spine, non-traumatic, sequela    Dysphagia    GERD with stricture 03/04/2019   Hx of sessile serrated colonic polyp 06/28/2013   Hyperlipidemia    Osteoporosis    PONV (postoperative nausea and vomiting)    Scoliosis    Uterine prolapse     Past Surgical History:  Procedure Laterality Date   ABDOMINAL HYSTERECTOMY     COLONOSCOPY     ESOPHAGOGASTRODUODENOSCOPY     harrington rod--scoliosis     PARTIAL HYSTERECTOMY     UPPER GI ENDOSCOPY N/A 12/30/2022   Procedure: UPPER GI ENDOSCOPY;  Surgeon: Gaynelle Adu, MD;  Location: [facility];  Service: General;  Laterality: N/A;   XI ROBOTIC ASSISTED HIATAL HERNIA REPAIR N/A 12/30/2022   Procedure: ROBOTIC REPAIR LARGE HIATAL HERNIA WITH WRAP;  Surgeon: Gaynelle Adu, MD;  Location: [facility];  Service: General;  Laterality: N/A;  210      Outpatient Encounter Medications as of 02/24/2023  Medication Sig Note   Ascorbic Acid (VITAMIN C) 100 MG tablet Take 100 mg by mouth daily. 12/24/2022: On hold   cetirizine (ZYRTEC) 10 MG tablet Take 10 mg by mouth daily.    escitalopram (LEXAPRO) 10 MG tablet Take 1 tablet (10 mg total) by mouth daily.    methocarbamol (ROBAXIN) 500 MG tablet Take 1 tablet (500 mg total) by mouth every 8 (eight) hours as needed for muscle spasms.    NON FORMULARY Cpap machine nightly    omeprazole (PRILOSEC) 40 MG capsule TAKE ONE CAPSULE BY MOUTH TWICE A DAY    ondansetron (ZOFRAN) 4 MG tablet Take 1 tablet (4 mg total) by mouth every 8 (eight) hours as needed for nausea or vomiting.    oxyCODONE (OXY IR/ROXICODONE) 5 MG immediate release tablet Take 1 tablet (5 mg total) by mouth every 6 (six) hours as needed for severe pain (pain score 7-10).    rosuvastatin (CRESTOR) 20 MG tablet Take 1 tablet (20 mg total) by mouth daily. 12/24/2022: Has not started yet   zinc gluconate 50 MG tablet Take 50 mg by mouth daily. 12/24/2022: On hold   zolpidem (AMBIEN) 5 MG tablet Take 1 tablet (5 mg total) by mouth at bedtime as needed for sleep.    No facility-administered encounter medications on file as of 02/24/2023.    ------------------------------------------------------------------------------------------------------  _____________________________________________________________ OBJECTIVE  PHYSICAL EXAM  Today's Vitals   02/24/23 1110  BP: 126/86  Weight: 135 lb (61.2 kg)  Height: 5\' 9"  (1.753 m)   Body  mass index is 19.94 kg/m.   reviewed  General: A+Ox3, no acute distress, well-nourished, appropriate affect CV: pulses 2+ regular, nondiaphoretic, no peripheral edema, cap refill <2sec Lungs: no audible wheezing, non-labored breathing, bilateral chest rise/fall, nontachypneic Skin: warm, well-perfused, non-icteric, no susp lesions or  rashes Neuro: no focal deficits. Sensation intact, muscle tone wnl, no atrophy Psych: no signs of depression or anxiety MSK:  Forward lumbar flexion 80 deg, extension 15, both eliciting lower lumbar pain. Lower thoracic and diffuse lumbar paraspinal muscle hypertonicity with visible straightening of the lumbar spine. There is TTP overlying L4-L5 spine and paraspinal musculature. Stork testing resting equivocal, but seems to elicit both paraspinal and midline tenderness. Visible SIJ asymmetry with correlative ASIS asymmetry.  b/l +trendelenberg. TTP SIJ b/l, L glute tenderness, NTTP isch tub. there is b/l hamstring tightness. FABER +, FADIR neg, provocative of back pain. Scour test, log roll test negative.   SLR negative at 30 degrees, radiating distal numbness elicited at hip flexion of 45 degrees b/l  Narrative & Impression  CLINICAL DATA:  Compression fracture   EXAM: MRI LUMBAR SPINE WITHOUT CONTRAST   TECHNIQUE: Multiplanar, multisequence MR imaging of the lumbar spine was performed. No intravenous contrast was administered.   COMPARISON:  12/10/2020   FINDINGS: Segmentation:  Standard. Alignment:  Physiologic. Vertebrae: Inferior endplate fracture with mild marrow edema at L4, subacute in appearance. Remote superior endplate fracture of T12 with mild height loss. Thoracic to L2 fusion for scoliosis. Conus medullaris and cauda equina: Conus extends to the L1 level. Conus and cauda equina appear normal. Paraspinal and other soft tissues: Mild periarticular edema around the right L4-5 facet.   Disc levels: L2-L3: Disc narrowing and bulging with ventral endplate spurring D6-U4: Mild disc bulging L4-L5: Mild disc bulging and facet spurring L5-S1:Mild facet spurring   IMPRESSION: 1. Subacute L4 inferior endplate fracture with mild depression. Mild periarticular edema around the right L4-5 facet which could be traumatic or degenerative. 2. Thoracolumbar fusion with accentuated  disc degeneration at the adjacent L2-3 disc space. No neural impingement.   _____________________________________________________________ ASSESSMENT/PLAN Diagnoses and all orders for this visit:  Acute bilateral low back pain with bilateral sciatica -     MR LUMBAR SPINE WO CONTRAST; Future -     DG Lumbar Spine Complete; Future  Other orders -     methocarbamol (ROBAXIN) 500 MG tablet; Take 1 tablet (500 mg total) by mouth every 8 (eight) hours as needed for muscle spasms.   Acute lower back pain with worsening b/l sciatica since injury sustained 02/18/23 lifting granddaughter while standing straight, reporting quality of pain identical to episodes where she has sustained fractures from maneuvers such as the one she performed that day. Endorsing numbness, intermittent, that follows saddle distribution that is new since last lumbar MRI, which preceded most recent acute injury. Will proceed with XR films today and repeat lumbar MRI Refill for Robaxin by request for pain management, patient reports has been beneficial in the past. Anticipate f/u pending XR/MRI. May consider SIJ CSI, OMT, epidural injection for lower back pain management if imaging reassuring All questions answered. Return and ED precautions discussed. Patient verbalized understanding and is in agreement with plan   Electronically signed by: Burna Forts, MD 02/24/2023 10:07 AM

## 2023-02-25 ENCOUNTER — Encounter: Payer: Self-pay | Admitting: Family Medicine

## 2023-02-27 ENCOUNTER — Encounter: Payer: Self-pay | Admitting: Family Medicine

## 2023-02-27 ENCOUNTER — Other Ambulatory Visit: Payer: Commercial Managed Care - HMO

## 2023-02-28 NOTE — Telephone Encounter (Addendum)
 Patient is ready for scheduling on or after: 03/10/23 BUY AND BILL  Out-of-pocket cost due at time of visit: $618.93 If using [employer] Assist copay program: $25.00  Primary: [employer]- Commercial Evenity  co-insurance: 25% (approximately $586.43 Admin fee co-insurance: 25% (approximately $32.50)  Deductible: N/A  Prior Auth: initiated via cigna.promptpa.[email or web address]    ** This summary of benefits is an estimation of the patient's out-of-pocket cost. Exact cost may vary based on individual plan coverage.

## 2023-03-04 ENCOUNTER — Encounter: Payer: Self-pay | Admitting: Family

## 2023-03-05 ENCOUNTER — Ambulatory Visit
Admission: RE | Admit: 2023-03-05 | Discharge: 2023-03-05 | Disposition: A | Discharge status: Home / Self Care | Payer: Commercial Managed Care - HMO | Source: Ambulatory Visit | Attending: Family Medicine | Admitting: Family Medicine

## 2023-03-05 DIAGNOSIS — M5441 Lumbago with sciatica, right side: Secondary | ICD-10-CM

## 2023-03-06 ENCOUNTER — Encounter: Payer: Self-pay | Admitting: Family

## 2023-03-10 ENCOUNTER — Ambulatory Visit (INDEPENDENT_AMBULATORY_CARE_PROVIDER_SITE_OTHER): Payer: Commercial Managed Care - HMO | Admitting: *Deleted

## 2023-03-10 ENCOUNTER — Other Ambulatory Visit: Payer: Self-pay | Admitting: Family Medicine

## 2023-03-10 DIAGNOSIS — M810 Age-related osteoporosis without current pathological fracture: Secondary | ICD-10-CM | POA: Diagnosis present

## 2023-03-10 DIAGNOSIS — S39012A Strain of muscle, fascia and tendon of lower back, initial encounter: Secondary | ICD-10-CM

## 2023-03-10 MED ORDER — ROMOSOZUMAB-AQQG 105 MG/1.17ML ~~LOC~~ SOSY
210.0000 mg | PREFILLED_SYRINGE | Freq: Once | SUBCUTANEOUS | Status: AC
  Administered 2023-03-10: 210 mg via SUBCUTANEOUS

## 2023-03-10 NOTE — Progress Notes (Signed)
 MRI results discussed with patient, remote stable T12 findings appear unchanged from prior MRIs. Conservative therapies discussed, formal PT referral placed to assist with lower and upper body conditioning. Anticipate f/u 3-4 weeks

## 2023-03-10 NOTE — Progress Notes (Signed)
Patient is here for evenity injection #8. Patient received bilateral arm [place] evenity injections today. She tolerated injections well. She will return in 1 month for her next evenity injection.

## 2023-03-12 ENCOUNTER — Encounter: Payer: Self-pay | Admitting: Anesthesiology
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
## 2023-03-13 ENCOUNTER — Telehealth: Payer: Commercial Managed Care - HMO

## 2023-03-13 ENCOUNTER — Ambulatory Visit: Payer: Commercial Managed Care - HMO | Admitting: Nurse Practitioner

## 2023-03-13 ENCOUNTER — Encounter: Payer: Self-pay | Admitting: Nurse Practitioner

## 2023-03-13 ENCOUNTER — Ambulatory Visit: Payer: Self-pay | Admitting: Nurse Practitioner

## 2023-03-13 ENCOUNTER — Ambulatory Visit: Payer: Commercial Managed Care - HMO | Admitting: Neurology

## 2023-03-13 VITALS — BP 122/80 | HR 74 | Temp 98.0°F | Wt 139.8 lb

## 2023-03-13 DIAGNOSIS — M62838 Other muscle spasm: Secondary | ICD-10-CM

## 2023-03-13 DIAGNOSIS — J0100 Acute maxillary sinusitis, unspecified: Secondary | ICD-10-CM | POA: Diagnosis not present

## 2023-03-13 MED ORDER — FLUCONAZOLE 150 MG PO TABS: ORAL_TABLET | ORAL | 0 refills | Status: DC

## 2023-03-13 MED ORDER — DOXYCYCLINE HYCLATE 100 MG PO TABS: 100.0000 mg | ORAL_TABLET | Freq: Two times a day (BID) | ORAL | 0 refills | Status: DC

## 2023-03-13 MED ORDER — CYCLOBENZAPRINE HCL 10 MG PO TABS: 10.0000 mg | ORAL_TABLET | Freq: Three times a day (TID) | ORAL | 1 refills | Status: AC | PRN

## 2023-03-13 MED ORDER — PREDNISONE 20 MG PO TABS: 40.0000 mg | ORAL_TABLET | Freq: Every day | ORAL | 0 refills | Status: DC

## 2023-03-13 NOTE — Patient Instructions (Signed)
It was great to see you!  Start doxycyline 1 pill twice a day with food for 10 days  Start prednisone in the morning 2 pills with food for 5 days  Start diflucan 1 tablet at the end of the antibiotics and another 3 days later if needed  Let's follow-up if your symptoms worsen or don't improve.   Take care,  Rodman Pickle, NP

## 2023-03-13 NOTE — Telephone Encounter (Signed)
  Chief Complaint: sinus pain Symptoms: sinus pain/pressure, cough, sore throat, earache Frequency: since last thursday Pertinent Negatives: Patient denies current fever, sob Disposition: [] ED /[] Urgent Care (no appt availability in office) / [] Appointment(In office/virtual)/ [x]  [facility]/ [] Home Care/ [] Refused Recommended Disposition /[] [place]/ []  Follow-up with PCP Additional Notes: Patient reports she has been experiencing sinus pain/pressure, sore throat, cough, and earache since last Thursday. Patient states she has been taking OTC medication with no relief. Per protocol, this RN attempted to schedule in office appt, no availability, so this RN scheduled virtual UC appt today 1/16. Patient advised to call back with worsening symptoms. Patient verbalized understanding.      Copied from CRM [id number]. Topic: Clinical - Medical Advice >> Mar 13, 2023  8:47 AM Elizebeth Brooking wrote: Reason for CRM: Patient called in stating that she is not feeling good, explained that she has a very nasty sinus infection and her throat is hurting is really bad. She is asking if someone could give her call to see what she should do, and if someone could prescribe her some antibiotics due to there is no available appointments until next week, Reason for Disposition  Earache  Answer Assessment - Initial Assessment Questions 1. LOCATION: "Where does it hurt?"      Left side of face 2. ONSET: "When did the sinus pain start?"  (e.g., hours, days)      thursday 3. SEVERITY: "How bad is the pain?"   (Scale 1-10; mild, moderate or severe)   - MILD (1-3): doesn't interfere with normal activities    - MODERATE (4-7): interferes with normal activities (e.g., work or school) or awakens from sleep   - SEVERE (8-10): excruciating pain and patient unable to do any normal activities        7/10 4. RECURRENT SYMPTOM: "Have you ever had sinus problems before?" If Yes, ask: "When was the last time?" and  "What happened that time?"      yes 5. NASAL CONGESTION: "Is the nose blocked?" If Yes, ask: "Can you open it or must you breathe through your mouth?"     yes 6. NASAL DISCHARGE: "Do you have discharge from your nose?" If so ask, "What color?"     Yes, but mainly pressure and going down my throat 7. FEVER: "Do you have a fever?" If Yes, ask: "What is it, how was it measured, and when did it start?"      I haven't taken my temp this morning but yesterday I had a lowgrade fever 8. OTHER SYMPTOMS: "Do you have any other symptoms?" (e.g., sore throat, cough, earache, difficulty breathing)   Earache, sore throat, cough, sinus pressure  Protocols used: Sinus Pain or Congestion-A-AH

## 2023-03-13 NOTE — Progress Notes (Signed)
Acute Office Visit  Subjective:     Patient ID: Roberta Sims, female    DOB: 10-Nov-1970, 53 y.o.   MRN: [id number]  Chief Complaint  Patient presents with   Facial Pain    With congestion, Rx of flexeril    HPI Patient is in today for sinus pain and congestion.  Discussed the use of AI scribe software for clinical note transcription with the patient, who gave verbal consent to proceed.  History of Present Illness   The patient, with a history of hernia surgery and muscle spasms, presents with symptoms suggestive of a sinus infection. She reports head pain, ear discomfort, and sinus congestion, primarily on the left side. The symptoms have been ongoing for about a week, and despite initial improvement, the patient's condition has worsened. The patient has been managing her symptoms with over-the-counter sinus and cold medications and a leftover prescription cough medicine. The patient also mentions a history of a deep sinus infection last March, which required multiple antibiotics and a CT scan to diagnose and treat. The patient has been taking Flexeril for muscle spasms and has recently switched from Robaxin due to feeling "off" while taking it.     ROS See pertinent positives and negatives per HPI.     Objective:    BP 122/80 (BP Location: Left Arm, Patient Position: Sitting, Cuff Size: Normal)   Pulse 74   Temp 98 F (36.7 C) (Oral)   Wt 139 lb 12.8 oz (63.4 kg)   SpO2 97%   BMI 20.64 kg/m    Physical Exam Vitals and nursing note reviewed.  Constitutional:      General: She is not in acute distress.    Appearance: Normal appearance.  HENT:     Head: Normocephalic.     Right Ear: Tympanic membrane, ear canal and external ear normal.     Left Ear: Tympanic membrane, ear canal and external ear normal.     Nose:     Right Sinus: No maxillary sinus tenderness or frontal sinus tenderness.     Left Sinus: Maxillary sinus tenderness and frontal sinus tenderness  present.  Eyes:     Conjunctiva/sclera: Conjunctivae normal.  Cardiovascular:     Rate and Rhythm: Normal rate and regular rhythm.     Pulses: Normal pulses.     Heart sounds: Normal heart sounds.  Pulmonary:     Effort: Pulmonary effort is normal.     Breath sounds: Normal breath sounds.  Musculoskeletal:     Cervical back: Normal range of motion and neck supple. No tenderness.  Lymphadenopathy:     Cervical: No cervical adenopathy.  Skin:    General: Skin is warm.  Neurological:     General: No focal deficit present.     Mental Status: She is alert and oriented to person, place, and time.  Psychiatric:        Mood and Affect: Mood normal.        Behavior: Behavior normal.        Thought Content: Thought content normal.        Judgment: Judgment normal.       Assessment & Plan:   Problem List Items Addressed This Visit   None Visit Diagnoses       Acute non-recurrent maxillary sinusitis    -  Primary   Start Doxycycline 100mg  BID x10 days & Prednisone 10mg  BIDx5 days. Continue OTC sinus and cold medication as needed. Encourage use of a neti pot or  sinus rinse.   Relevant Medications   doxycycline (VIBRA-TABS) 100 MG tablet   predniSONE (DELTASONE) 20 MG tablet   fluconazole (DIFLUCAN) 150 MG tablet     Muscle spasm       Refilled flexeril 10mg  TID prn.       Meds ordered this encounter  Medications   cyclobenzaprine (FLEXERIL) 10 MG tablet    Sig: Take 1 tablet (10 mg total) by mouth 3 (three) times daily as needed for muscle spasms.    Dispense:  90 tablet    Refill:  1   doxycycline (VIBRA-TABS) 100 MG tablet    Sig: Take 1 tablet (100 mg total) by mouth 2 (two) times daily.    Dispense:  20 tablet    Refill:  0   predniSONE (DELTASONE) 20 MG tablet    Sig: Take 2 tablets (40 mg total) by mouth daily with breakfast.    Dispense:  10 tablet    Refill:  0   fluconazole (DIFLUCAN) 150 MG tablet    Sig: Start 1 tablet after finishing antibiotics and another  3 days later if needed    Dispense:  2 tablet    Refill:  0    Return if symptoms worsen or fail to improve.  Gerre Scull, NP

## 2023-03-13 NOTE — Telephone Encounter (Signed)
Noted  

## 2023-03-13 NOTE — Telephone Encounter (Signed)
Evenity Injection Schedule Inj #1 - 07/02/22 Inj #2 - 08/20/22 Inj #3 - 09/20/22 Inj #4 - 10/22/22 Inj #5 - 11/28/22 Inj #6 - 01/06/23 Inj #7 - 02/06/23   Re-run benefits for 2025  Needs updated PA info   Inj #8 - 03/10/23 Inj #9 - scheduled 04/14/23 Inj #10 -  Inj #11 -  Inj #12 -

## 2023-03-19 ENCOUNTER — Encounter: Payer: Self-pay | Admitting: Nurse Practitioner

## 2023-03-20 ENCOUNTER — Ambulatory Visit: Payer: Commercial Managed Care - HMO | Admitting: Internal Medicine

## 2023-03-20 ENCOUNTER — Encounter: Payer: Self-pay | Admitting: Internal Medicine

## 2023-03-20 ENCOUNTER — Ambulatory Visit: Payer: Self-pay | Admitting: Nurse Practitioner

## 2023-03-20 VITALS — BP 112/80 | HR 90 | Temp 98.2°F | Ht 69.0 in | Wt 140.0 lb

## 2023-03-20 DIAGNOSIS — R300 Dysuria: Secondary | ICD-10-CM | POA: Diagnosis not present

## 2023-03-20 DIAGNOSIS — J0140 Acute pansinusitis, unspecified: Secondary | ICD-10-CM | POA: Diagnosis not present

## 2023-03-20 DIAGNOSIS — B3731 Acute candidiasis of vulva and vagina: Secondary | ICD-10-CM | POA: Diagnosis not present

## 2023-03-20 DIAGNOSIS — H65192 Other acute nonsuppurative otitis media, left ear: Secondary | ICD-10-CM

## 2023-03-20 LAB — POC URINALSYSI DIPSTICK (AUTOMATED)
Bilirubin, UA: NEGATIVE
Blood, UA: NEGATIVE
Glucose, UA: NEGATIVE
Ketones, UA: POSITIVE
Leukocytes, UA: NEGATIVE
Nitrite, UA: NEGATIVE
Protein, UA: POSITIVE — AB
Spec Grav, UA: >=1.03 — AB (ref 1.010–1.025)
Urobilinogen, UA: 0.2 U/dL
pH, UA: 6 (ref 5.0–8.0)

## 2023-03-20 MED ORDER — PREDNISONE 20 MG PO TABS: 40.0000 mg | ORAL_TABLET | Freq: Every day | ORAL | 0 refills | Status: DC

## 2023-03-20 MED ORDER — LEVOFLOXACIN 750 MG PO TABS: 750.0000 mg | ORAL_TABLET | Freq: Every day | ORAL | 0 refills | Status: AC

## 2023-03-20 NOTE — Progress Notes (Signed)
[facility] [address] [place] [address] Dept: [phone] Dept Fax: [phone]  Acute Care Office Visit  Subjective:   Roberta Sims 09-11-1970 03/20/2023  Chief Complaint  Patient presents with   Pelvic Pain    With urinary discomfort, left ovarian pain, yeast infection, sinusitis    HPI: Discussed the use of AI scribe software for clinical note transcription with the patient, who gave verbal consent to proceed.  History of Present Illness   The patient, with a history of severe sinus infections, presents for follow-up after a recent sinus infection and fluid in the left ear, in which she was prescribed doxycycline and 5-day course of prednisone. She reports that the sinus infection was similar to one she had a year ago, which was resistant to two types of antibiotics and only resolved after a third antibiotic was administered. The patient also reports that the doxycycline was causing a significant yeast infection, so she stopped taking 2 days ago. She has started the diflucan yesterday.    The patient reports that the sinus infection symptoms have improved, with the first day without a headache, but she can still feel some discomfort in the ear and sinus cavity. She completed a steroid pack that was prescribed to her.  In addition to the sinus infection and yeast infection, the patient reports abdominal pain, more pronounced on the left side, and a sweet smell to her urine. She does not report any burning sensation when urinating but does report a feeling of pressure and urgency.       The following portions of the patient's history were reviewed and updated as appropriate: past medical history, past surgical history, family history, social history, allergies, medications, and problem list.   Patient Active Problem List   Diagnosis Date Noted   S/P repair of paraesophageal hernia 12/30/2022   Closed compression  fracture of fourth lumbar vertebra (HCC) 05/23/2022   OSA (obstructive sleep apnea) 03/05/2022   IDA (iron deficiency anemia) 01/23/2022   Absolute anemia 01/04/2022   Insomnia 11/27/2021   Multiple closed pelvic fractures without disruption of pelvic circle (HCC) 11/27/2021   Closed nondisplaced zone III fracture of sacrum (HCC) 11/20/2021   Stress fracture of right femur 11/20/2021   Snoring 11/07/2021   Thoracogenic scoliosis of thoracic region 11/07/2021   GERD with apnea 11/07/2021   Nocturia 11/07/2021   Pelvic fracture (HCC) 11/06/2021   Osteoporosis 06/29/2021   Hyperlipidemia 06/29/2021   Acute bilateral thoracic back pain 03/07/2021   Compression fracture of T12 vertebra (HCC) 12/11/2020   Compression fracture of L1 lumbar vertebra (HCC) 12/11/2020   Estrogen deficiency 12/11/2020   Vitamin D deficiency 12/11/2020   History of thoracic spinal fusion 11/23/2020   Piriformis syndrome of left side 11/14/2020   GERD with stricture 03/04/2019   Esophageal dysphagia 01/07/2019   Essential familial hyperlipidemia 04/24/2018   Hypertriglyceridemia, familial 04/24/2018   Anxiety 04/23/2018   H/O scoliosis 04/23/2018   Impaired fasting glucose 04/23/2018   Exposure to hepatitis C 03/25/2018   Menopausal symptoms 03/25/2018   Pain of left side of body 03/25/2018   Hx of colonic polyps 06/28/2013   Past Medical History:  Diagnosis Date   Anemia    Anxiety    Complication of anesthesia    Compression fracture of thoracic spine, non-traumatic, sequela    Dysphagia    GERD with stricture 03/04/2019   Hx of sessile serrated colonic polyp 06/28/2013   Hyperlipidemia  Osteoporosis    PONV (postoperative nausea and vomiting)    Scoliosis    Uterine prolapse    Past Surgical History:  Procedure Laterality Date   ABDOMINAL HYSTERECTOMY     COLONOSCOPY     ESOPHAGOGASTRODUODENOSCOPY     harrington rod--scoliosis     PARTIAL HYSTERECTOMY     UPPER GI ENDOSCOPY N/A  12/30/2022   Procedure: UPPER GI ENDOSCOPY;  Surgeon: Gaynelle Adu, MD;  Location: [facility];  Service: General;  Laterality: N/A;   XI ROBOTIC ASSISTED HIATAL HERNIA REPAIR N/A 12/30/2022   Procedure: ROBOTIC REPAIR LARGE HIATAL HERNIA WITH WRAP;  Surgeon: Gaynelle Adu, MD;  Location: [facility];  Service: General;  Laterality: N/A;  210   Family History  Problem Relation Age of Onset   Pancreatic cancer Mother        metastasized to colon   Alcohol abuse Brother    Diabetes Brother    Cancer Maternal Aunt        breast   Colon cancer Maternal Grandmother     Current Outpatient Medications:    Ascorbic Acid (VITAMIN C) 100 MG tablet, Take 100 mg by mouth daily., Disp: , Rfl:    cetirizine (ZYRTEC) 10 MG tablet, Take 10 mg by mouth daily., Disp: , Rfl:    cyclobenzaprine (FLEXERIL) 10 MG tablet, Take 1 tablet (10 mg total) by mouth 3 (three) times daily as needed for muscle spasms., Disp: 90 tablet, Rfl: 1   escitalopram (LEXAPRO) 10 MG tablet, Take 1 tablet (10 mg total) by mouth daily., Disp: 90 tablet, Rfl: 1   fluconazole (DIFLUCAN) 150 MG tablet, Start 1 tablet after finishing antibiotics and another 3 days later if needed, Disp: 2 tablet, Rfl: 0   levofloxacin (LEVAQUIN) 750 MG tablet, Take 1 tablet (750 mg total) by mouth daily for 7 days., Disp: 7 tablet, Rfl: 0   NON FORMULARY, Cpap machine nightly, Disp: , Rfl:    omeprazole (PRILOSEC) 40 MG capsule, TAKE ONE CAPSULE BY MOUTH TWICE A DAY, Disp: 90 capsule, Rfl: 3   ondansetron (ZOFRAN) 4 MG tablet, Take 1 tablet (4 mg total) by mouth every 8 (eight) hours as needed for nausea or vomiting., Disp: 20 tablet, Rfl: 1   rosuvastatin (CRESTOR) 20 MG tablet, Take 1 tablet (20 mg total) by mouth daily., Disp: 90 tablet, Rfl: 1   zinc gluconate 50 MG tablet, Take 50 mg by mouth daily., Disp: , Rfl:    zolpidem (AMBIEN) 5 MG tablet, Take 1 tablet (5 mg total) by mouth at bedtime as needed for sleep., Disp: 30 tablet, Rfl: 2   predniSONE  (DELTASONE) 20 MG tablet, Take 2 tablets (40 mg total) by mouth daily with breakfast., Disp: 10 tablet, Rfl: 0 Allergies  Allergen Reactions   Meloxicam Other (See Comments)    Headache     ROS: A complete ROS was performed with pertinent positives/negatives noted in the HPI. The remainder of the ROS are negative.    Objective:   Today's Vitals   03/20/23 1552  BP: 112/80  Pulse: 90  Temp: 98.2 F (36.8 C)  SpO2: 98%  Weight: 140 lb (63.5 kg)  Height: 5\' 9"  (1.753 m)    GENERAL: Well-appearing, in NAD. Well nourished.  SKIN: Pink, warm and dry. No rash, lesion, ulceration, or ecchymoses.  HEENT:    HEAD: Normocephalic, non-traumatic.  EYES: Conjunctive pink without exudate.  EARS: External ear w/o redness, swelling, masses, or lesions. EAC clear. TM intact, hazy appearance on right. Left  TM intact, translucent w/o bulging, appropriate landmarks visualized.  NOSE: Septum midline w/o deformity. Nares patent, mucosa pink and non-inflamed w/o drainage. Maxillary sinus tenderness THROAT: Uvula midline. Oropharynx clear. Tonsils non-inflamed w/o exudate. Mucus membranes pink and moist.  NECK: Trachea midline. Full ROM w/o pain or tenderness. Submandibular lymph node enlargement RESPIRATORY: Chest wall symmetrical. Respirations even and non-labored. Breath sounds clear to auscultation bilaterally.  CARDIAC: S1, S2 present, regular rate and rhythm. Peripheral pulses 2+ bilaterally.  GI: Abdomen soft, suprapubic tenderness. Normoactive bowel sounds. No rebound tenderness. No hepatomegaly or splenomegaly. No CVA tenderness.  EXTREMITIES: Without clubbing, cyanosis, or edema.  NEUROLOGIC: Steady, even gait.  PSYCH/MENTAL STATUS: Alert, oriented x 3. Cooperative, appropriate mood and affect.    Results for orders placed or performed in visit on 03/20/23  POCT Urinalysis Dipstick (Automated)  Result Value Ref Range   Color, UA     Clarity, UA     Glucose, UA Negative Negative    Bilirubin, UA Negative    Ketones, UA Positive    Spec Grav, UA >=1.030 (A) 1.010 - 1.025   Blood, UA Negative    pH, UA 6.0 5.0 - 8.0   Protein, UA Positive (A) Negative   Urobilinogen, UA 0.2 0.2 or 1.0 E.U./dL   Nitrite, UA Negative    Leukocytes, UA Negative Negative      Assessment & Plan:  1. Dysuria (Primary) - sending urine culture off for testing. Awaiting results. Starting Levaquin for suspected UTI. Levaquin for Sinus and UTI coverage. After failing Doxycycline. Has tried Augmentin in the past with no relief.   - POCT Urinalysis Dipstick (Automated) - levofloxacin (LEVAQUIN) 750 MG tablet; Take 1 tablet (750 mg total) by mouth daily for 7 days.  Dispense: 7 tablet; Refill: 0 - Urine Culture  2. Acute non-recurrent pansinusitis - levofloxacin (LEVAQUIN) 750 MG tablet; Take 1 tablet (750 mg total) by mouth daily for 7 days.  Dispense: 7 tablet; Refill: 0 - predniSONE (DELTASONE) 20 MG tablet; Take 2 tablets (40 mg total) by mouth daily with breakfast.  Dispense: 10 tablet; Refill: 0  3. Acute MEE (middle ear effusion), left - resolved  4. Yeast vaginitis - continue diflucan as prescribed   Meds ordered this encounter  Medications   levofloxacin (LEVAQUIN) 750 MG tablet    Sig: Take 1 tablet (750 mg total) by mouth daily for 7 days.    Dispense:  7 tablet    Refill:  0    Supervising Provider:   Garnette Gunner [[id number]]   predniSONE (DELTASONE) 20 MG tablet    Sig: Take 2 tablets (40 mg total) by mouth daily with breakfast.    Dispense:  10 tablet    Refill:  0    Supervising Provider:   Garnette Gunner [[id number]]   Orders Placed This Encounter  Procedures   Urine Culture   POCT Urinalysis Dipstick (Automated)   Lab Orders         Urine Culture         POCT Urinalysis Dipstick (Automated)     No images are attached to the encounter or orders placed in the encounter.  Return if symptoms worsen or fail to improve.   Salvatore Decent, FNP

## 2023-03-20 NOTE — Telephone Encounter (Addendum)
Chief Complaint: UTI symptoms Symptoms: Pelvic pressure, bladder pain, urinary urgency, yeast infection. Frequency: Constant Pertinent Negatives: Patient denies N/V, abd pain radiation, flank pain, back pain, urinary frequency, urinary hesitancy, diarrhea, rash, fever. Disposition: [] ED /[] Urgent Care (no appt availability in office) / [x] Appointment(In office/virtual)/ []  [facility]/ [] Home Care/ [] Refused Recommended Disposition /[] [facility]/ []  Follow-up with PCP Additional Notes: patient states she was being treated with Doxycyline for sinus infection however complained of having adverse reaction to abx with yeast infection symptoms. Patient was prophylactically prescribed diflucan for treatment, with patient taking as prescribed. Patient now has complaints of urinary urgency and pelvic pressure/pain from known UTI symptoms. Patient also states presence of LLQ to center abdominal pain, non-radiating and aggravated with palpation. Abd is firm to touch. Patient admits to taking Advil with no relief of pain symptoms. Patient denies any burning or frequency of urination. Patient denies back pain or flank pain, fevers, and n/v. Patient expressed need for medication adjustment for sinus infection due to adverse reaction and treatment for new onset suspected UTI symptoms. Patient advised by this RN to be seen within 4 hours per protocol, patient was agreeable to 3:40PM appt today. Patient advised by this RN to call back if any symptoms worsen. Patient verbalized understanding.  Copied from CRM [id number]. Topic: Clinical - Red Word Triage >> Mar 20, 2023 10:08 AM Shelbie Proctor wrote: Red Word that prompted transfer to Nurse Triage: Patient [phone] was seen for sinus infection 03/13/23, having side effects from the antibotics and stopped. Started to take medication for yeast infection. Patient messages provider for another antibotics and possible UTI, burning when urinating, urgency,  cloudy urine, and pressure abdomen and ovaries pain. Patient saw NP, McELwee is out of the office and wants to see if nurse or another provider is able to help.   [facility] [address] [place], [place] - [address] AT [facility] [address] [facility] [address] [place] [address] Phone: [phone] Fax: [phone] Reason for Disposition  [1] MILD-MODERATE pain AND [2] constant AND [3] present > 2 hours  Answer Assessment - Initial Assessment Questions 1. LOCATION: "Where does it hurt?"      Abdominal, LLQ to center near ovaries "this usually happens when I have a UTI" Abdomen feels firm. 2. RADIATION: "Does the pain shoot anywhere else?" (e.g., chest, back)     Denies 3. ONSET: "When did the pain begin?" (e.g., minutes, hours or days ago)      Yesterday 4. SUDDEN: "Gradual or sudden onset?"     Sudden with pain, gradual with urinary urgency symptoms. 5. PATTERN "Does the pain come and go, or is it constant?"    - If it comes and goes: "How long does it last?" "Do you have pain now?"     (Note: Comes and goes means the pain is intermittent. It goes away completely between bouts.)    - If constant: "Is it getting better, staying the same, or getting worse?"      (Note: Constant means the pain never goes away completely; most serious pain is constant and gets worse.)      Constant 6. SEVERITY: "How bad is the pain?"  (e.g., Scale 1-10; mild, moderate, or severe)    - MILD (1-3): Doesn't interfere with normal activities, abdomen soft and not tender to touch.     - MODERATE (4-7): Interferes with normal activities or awakens from sleep, abdomen tender to touch.     -  SEVERE (8-10): Excruciating pain, doubled over, unable to do any normal activities.       Moderate  7. RECURRENT SYMPTOM: "Have you ever had this type of stomach pain before?" If Yes, ask: "When was the last time?" and "What happened that time?"      "Yes, usually when I get a UTI I get the  bladder pain and pressure, with the abdominal pain." 8. CAUSE: "What do you think is causing the stomach pain?"    "I think I might have a UTI, I know what that feels like when I have one." 9. RELIEVING/AGGRAVATING FACTORS: "What makes it better or worse?" (e.g., antacids, bending or twisting motion, bowel movement)     Tried Advil with no relief. Aggravating - palpation.  10. OTHER SYMPTOMS: "Do you have any other symptoms?" (e.g., back pain, diarrhea, fever, urination pain, vomiting)       Urinary urgency, pelvic pressure  Protocols used: Abdominal Pain - Female-A-AH

## 2023-03-22 LAB — URINE CULTURE
MICRO NUMBER:: 15989260
SPECIMEN QUALITY:: ADEQUATE

## 2023-03-24 ENCOUNTER — Encounter: Payer: Self-pay | Admitting: Family

## 2023-03-24 ENCOUNTER — Other Ambulatory Visit: Payer: Self-pay | Admitting: Internal Medicine

## 2023-03-24 DIAGNOSIS — N390 Urinary tract infection, site not specified: Secondary | ICD-10-CM

## 2023-03-24 MED ORDER — NITROFURANTOIN MONOHYD MACRO 100 MG PO CAPS: 100.0000 mg | ORAL_CAPSULE | Freq: Two times a day (BID) | ORAL | 0 refills | Status: AC

## 2023-03-25 NOTE — Telephone Encounter (Signed)
Roberta Sims,  Have you gotten anything back from [place] about prior auth from [facility]? Nothing is showing up on the [employer] provider portal. Please f/u with [employer] about PA prior to next injection.   Thank you!

## 2023-03-27 ENCOUNTER — Ambulatory Visit: Payer: Commercial Managed Care - HMO | Attending: Family Medicine

## 2023-03-27 NOTE — Telephone Encounter (Addendum)
[email or web address] still shows Evenity PA is in progress. I called [employer] and answered additnaional clinical questions.  PA case ID: [id number] Rep states we will be notified via fax with a decision.  03/28/23- I spoke with Cordelia Pen at [facility] @ [phone]. She states a nurse case manager from [employer] will be contacting me to further assist with patient's Evenity PA.

## 2023-03-31 NOTE — Telephone Encounter (Addendum)
I received a call from Elana, nurse case manager at [facility], @ [phone] ext. [phone].She states Evenity PA is denied as [facility] are no longer in network with the patient's plan. Patient has 3 options to receive her last 4 Evenity injections.  Please advise on below:  White bag- Dr. Pearletha Forge can write a rx to a [facility] pharmacy for pt to bring to our office for administration. [facility] will contact pt with OOP and delivery info.  2.   Drug to home administration by a nurse which will also require a rx.   3.   Standing IVX at a facility near patient's home.   I need to inform Elana which option Dr. Pearletha Forge advises.

## 2023-04-02 NOTE — Telephone Encounter (Addendum)
 Verified Evenity  benefits with updated [employer] ins via [employer].   Patient is ready for scheduling on or after: 04/16/23 BUY AND BILL  Out-of-pocket cost due at time of visit: $4  Primary: [employer] Evenity  co-insurance: 25% (approximately $603) Admin fee co-insurance: 25% (approximately $25)  Deductible: n/a  Prior Auth: Approved  Auth #: [id number] Valid: 06/12/22 - 06/11/23  Secondary: [employer] For the secondary MD Purchase option, the secondary plan will coordinate benefits and consider 100% of remaining expenses, after the patient satisfies a $4 copay.    ** This summary of benefits is an estimation of the patient's out-of-pocket cost. Exact cost may vary based on individual plan coverage.

## 2023-04-14 ENCOUNTER — Ambulatory Visit: Payer: Commercial Managed Care - HMO | Admitting: Family Medicine

## 2023-04-14 ENCOUNTER — Ambulatory Visit: Payer: Commercial Managed Care - HMO

## 2023-04-14 NOTE — Progress Notes (Deleted)
 CHIEF COMPLAINT: No chief complaint on file.  _____________________________________________________________ SUBJECTIVE  HPI  Pt is a 53 y.o. female here for Follow-up of MRI Seen 02/24/2023 for lower back strain, patient had been concerned for fracture as she has sustained fractures in the past from routine maneuvers in setting of osteoporosis MRI was completed, these results were discussed with the patient's, which revealed stable T12 findings unchanged from prior MRIs, conservative therapies have been discussed at that time, and a formal PT referral has been placed  Ongoing for *** Inciting event: Primarily located   Radiating Numbness/tingling Catching/locking *** Exacerbated by Therapies tried so far:  Works as  Medical history includes scoliosis, GERD with stricture, nontraumatic compression fracture of thoracic spine, anemia, anxiety  ------------------------------------------------------------------------------------------------------ Past Medical History:  Diagnosis Date   Anemia    Anxiety    Complication of anesthesia    Compression fracture of thoracic spine, non-traumatic, sequela    Dysphagia    GERD with stricture 03/04/2019   Hx of sessile serrated colonic polyp 06/28/2013   Hyperlipidemia    Osteoporosis    PONV (postoperative nausea and vomiting)    Scoliosis    Uterine prolapse     Past Surgical History:  Procedure Laterality Date   ABDOMINAL HYSTERECTOMY     COLONOSCOPY     ESOPHAGOGASTRODUODENOSCOPY     harrington rod--scoliosis     PARTIAL HYSTERECTOMY     UPPER GI ENDOSCOPY N/A 12/30/2022   Procedure: UPPER GI ENDOSCOPY;  Surgeon: Gaynelle Adu, MD;  Location: [facility];  Service: General;  Laterality: N/A;   XI ROBOTIC ASSISTED HIATAL HERNIA REPAIR N/A 12/30/2022   Procedure: ROBOTIC REPAIR LARGE HIATAL HERNIA WITH WRAP;  Surgeon: Gaynelle Adu, MD;  Location: [facility];  Service: General;  Laterality: N/A;  210      Outpatient Encounter  Medications as of 04/14/2023  Medication Sig Note   Ascorbic Acid (VITAMIN C) 100 MG tablet Take 100 mg by mouth daily. 12/24/2022: On hold   cetirizine (ZYRTEC) 10 MG tablet Take 10 mg by mouth daily.    cyclobenzaprine (FLEXERIL) 10 MG tablet Take 1 tablet (10 mg total) by mouth 3 (three) times daily as needed for muscle spasms.    escitalopram (LEXAPRO) 10 MG tablet Take 1 tablet (10 mg total) by mouth daily.    fluconazole (DIFLUCAN) 150 MG tablet Start 1 tablet after finishing antibiotics and another 3 days later if needed    NON FORMULARY Cpap machine nightly    omeprazole (PRILOSEC) 40 MG capsule TAKE ONE CAPSULE BY MOUTH TWICE A DAY    ondansetron (ZOFRAN) 4 MG tablet Take 1 tablet (4 mg total) by mouth every 8 (eight) hours as needed for nausea or vomiting.    predniSONE (DELTASONE) 20 MG tablet Take 2 tablets (40 mg total) by mouth daily with breakfast.    rosuvastatin (CRESTOR) 20 MG tablet Take 1 tablet (20 mg total) by mouth daily. 12/24/2022: Has not started yet   zinc gluconate 50 MG tablet Take 50 mg by mouth daily. 12/24/2022: On hold   zolpidem (AMBIEN) 5 MG tablet Take 1 tablet (5 mg total) by mouth at bedtime as needed for sleep.    No facility-administered encounter medications on file as of 04/14/2023.    ------------------------------------------------------------------------------------------------------  _____________________________________________________________ OBJECTIVE  PHYSICAL EXAM  There were no vitals filed for this visit. There is no height or weight on file to calculate BMI.   reviewed  General: A+Ox3, no acute distress, well-nourished, appropriate affect CV: pulses 2+ regular,  nondiaphoretic, no peripheral edema, cap refill <2sec Lungs: no audible wheezing, non-labored breathing, bilateral chest rise/fall, nontachypneic Skin: warm, well-perfused, non-icteric, no susp lesions or rashes Neuro: *** Sensation intact, muscle tone wnl, no  atrophy Psych: no signs of depression or anxiety MSK: ***     FINDINGS: Segmentation: 5 non rib-bearing lumbar type vertebral bodies are present. The lowest fully formed vertebral body is L5. Alignment: No significant listhesis is present. Lumbar lordosis is stable. Vertebrae: Progressive type 2 Modic changes are present posteriorly at L2-3. And inferior endplate Schmorl's node is again noted at L4 with adjacent type 2 Modic changes. A remote superior endplate fracture of T12 with fatty marrow changes is stable. Marrow signal and vertebral body heights are otherwise normal. Conus medullaris and cauda equina: Conus extends to the L1-2 level. Conus and cauda equina appear normal. Paraspinal and other soft tissues: Limited imaging the abdomen is unremarkable. There is no significant adenopathy. No solid organ lesions are present.   Disc levels: T12-L1: Insert normal disc L1-2: Normal disc signal and height is present. No focal protrusion or stenosis is present. L2-3: Mild disc bulging is present. No focal disc protrusion or stenosis is present. L3-4: A mild broad-based disc protrusion is again seen, asymmetric to the left. A central annular tear is present. No focal stenosis is present. L4-5: Mild disc bulging and facet hypertrophy present. The facet hypertrophy demonstrates some progression. No focal stenosis is present. L5-S1: Mild left facet hypertrophy is stable. No significant stenosis is present.   IMPRESSION: 1. Stable mild broad-based disc protrusion at L3-4, asymmetric to the left, without focal stenosis. 2. Progressive facet hypertrophy at L4-5 without focal stenosis. 3. Stable mild left facet hypertrophy at L5-S1 without significant stenosis. 4. Progressive type 2 Modic changes posteriorly at L2-3. 5. Stable inferior endplate Schmorl's node at L4 with adjacent type 2 Modic changes. 6. Stable remote superior endplate fracture of T12 with fatty marrow changes.    _____________________________________________________________ ASSESSMENT/PLAN There are no diagnoses linked to this encounter.  ESI?    Electronically signed by: Burna Forts, MD 04/14/2023 7:44 AM

## 2023-04-16 ENCOUNTER — Encounter: Payer: Self-pay | Admitting: Family Medicine

## 2023-04-16 ENCOUNTER — Ambulatory Visit: Payer: Commercial Managed Care - HMO

## 2023-04-16 ENCOUNTER — Ambulatory Visit (INDEPENDENT_AMBULATORY_CARE_PROVIDER_SITE_OTHER): Payer: Commercial Managed Care - HMO | Admitting: Family Medicine

## 2023-04-16 VITALS — BP 130/80 | Ht 69.0 in | Wt 140.0 lb

## 2023-04-16 DIAGNOSIS — M545 Low back pain, unspecified: Secondary | ICD-10-CM

## 2023-04-16 DIAGNOSIS — M8000XG Age-related osteoporosis with current pathological fracture, unspecified site, subsequent encounter for fracture with delayed healing: Secondary | ICD-10-CM

## 2023-04-16 MED ORDER — ROMOSOZUMAB-AQQG 105 MG/1.17ML ~~LOC~~ SOSY
210.0000 mg | PREFILLED_SYRINGE | Freq: Once | SUBCUTANEOUS | Status: AC
  Administered 2023-04-16: 210 mg via SUBCUTANEOUS

## 2023-04-16 NOTE — Progress Notes (Signed)
 Patient is here for nurse visit for her  9th Evenity. Patient received bilateral [place] injections.  She tolerated injections well. She will call us if needed and she will return in 1 month for her next Evenity injection.

## 2023-04-16 NOTE — Progress Notes (Unsigned)
 CHIEF COMPLAINT: No chief complaint on file.  _____________________________________________________________ SUBJECTIVE  HPI  Pt is a 53 y.o. female here for Follow-up of MRI Seen 02/24/2023 for lower back strain, patient had been concerned for fracture as she has sustained fractures in the past from routine maneuvers in setting of osteoporosis MRI was completed, these results were discussed with the patient's, which revealed stable T12 findings unchanged from prior MRIs, no fracture; conservative therapies have been discussed at that time, and a formal PT referral has been placed  She shares that she has been doing exercises, which has offered interval improvement, unchanged quality/location of pain  Medical history includes scoliosis, GERD with stricture, nontraumatic compression fracture of thoracic spine, anemia, anxiety  ------------------------------------------------------------------------------------------------------ Past Medical History:  Diagnosis Date   Anemia    Anxiety    Complication of anesthesia    Compression fracture of thoracic spine, non-traumatic, sequela    Dysphagia    GERD with stricture 03/04/2019   Hx of sessile serrated colonic polyp 06/28/2013   Hyperlipidemia    Osteoporosis    PONV (postoperative nausea and vomiting)    Scoliosis    Uterine prolapse     Past Surgical History:  Procedure Laterality Date   ABDOMINAL HYSTERECTOMY     COLONOSCOPY     ESOPHAGOGASTRODUODENOSCOPY     harrington rod--scoliosis     PARTIAL HYSTERECTOMY     UPPER GI ENDOSCOPY N/A 12/30/2022   Procedure: UPPER GI ENDOSCOPY;  Surgeon: Gaynelle Adu, MD;  Location: [facility];  Service: General;  Laterality: N/A;   XI ROBOTIC ASSISTED HIATAL HERNIA REPAIR N/A 12/30/2022   Procedure: ROBOTIC REPAIR LARGE HIATAL HERNIA WITH WRAP;  Surgeon: Gaynelle Adu, MD;  Location: [facility];  Service: General;  Laterality: N/A;  210      Outpatient Encounter Medications as of 04/16/2023   Medication Sig Note   Ascorbic Acid (VITAMIN C) 100 MG tablet Take 100 mg by mouth daily. 12/24/2022: On hold   cetirizine (ZYRTEC) 10 MG tablet Take 10 mg by mouth daily.    cyclobenzaprine (FLEXERIL) 10 MG tablet Take 1 tablet (10 mg total) by mouth 3 (three) times daily as needed for muscle spasms.    escitalopram (LEXAPRO) 10 MG tablet Take 1 tablet (10 mg total) by mouth daily.    fluconazole (DIFLUCAN) 150 MG tablet Start 1 tablet after finishing antibiotics and another 3 days later if needed    NON FORMULARY Cpap machine nightly    omeprazole (PRILOSEC) 40 MG capsule TAKE ONE CAPSULE BY MOUTH TWICE A DAY    ondansetron (ZOFRAN) 4 MG tablet Take 1 tablet (4 mg total) by mouth every 8 (eight) hours as needed for nausea or vomiting.    predniSONE (DELTASONE) 20 MG tablet Take 2 tablets (40 mg total) by mouth daily with breakfast.    rosuvastatin (CRESTOR) 20 MG tablet Take 1 tablet (20 mg total) by mouth daily. 12/24/2022: Has not started yet   zinc gluconate 50 MG tablet Take 50 mg by mouth daily. 12/24/2022: On hold   zolpidem (AMBIEN) 5 MG tablet Take 1 tablet (5 mg total) by mouth at bedtime as needed for sleep.    No facility-administered encounter medications on file as of 04/16/2023.    ------------------------------------------------------------------------------------------------------  _____________________________________________________________ OBJECTIVE  PHYSICAL EXAM  Today's Vitals   04/16/23 1014  BP: 130/80  Weight: 140 lb (63.5 kg)  Height: 5\' 9"  (1.753 m)   Body mass index is 20.67 kg/m.   reviewed  General: A+Ox3, no acute distress,  well-nourished, appropriate affect CV: pulses 2+ regular, nondiaphoretic, no peripheral edema, cap refill <2sec Lungs: no audible wheezing, non-labored breathing, bilateral chest rise/fall, nontachypneic Skin: warm, well-perfused, non-icteric, no susp lesions or rashes Neuro:  Sensation intact, muscle tone wnl, no  atrophy Psych: no signs of depression or anxiety MSK:   Hypertonic paraspinal lumbar musculature +B/l SIJ tenderness +B/l trendelenberg No midline tenderness, FF 80, Ext 15 Stinchfield negative, L provocative of back pain Slump negative Asymmetric SIJ rigidity Hip flexion 110, ER 40, IR 20 Tight hamstrings FABER+ FADIR neg b/l Logroll negative     FINDINGS: Segmentation: 5 non rib-bearing lumbar type vertebral bodies are present. The lowest fully formed vertebral body is L5. Alignment: No significant listhesis is present. Lumbar lordosis is stable. Vertebrae: Progressive type 2 Modic changes are present posteriorly at L2-3. And inferior endplate Schmorl's node is again noted at L4 with adjacent type 2 Modic changes. A remote superior endplate fracture of T12 with fatty marrow changes is stable. Marrow signal and vertebral body heights are otherwise normal. Conus medullaris and cauda equina: Conus extends to the L1-2 level. Conus and cauda equina appear normal. Paraspinal and other soft tissues: Limited imaging the abdomen is unremarkable. There is no significant adenopathy. No solid organ lesions are present.   Disc levels: T12-L1: Insert normal disc L1-2: Normal disc signal and height is present. No focal protrusion or stenosis is present. L2-3: Mild disc bulging is present. No focal disc protrusion or stenosis is present. L3-4: A mild broad-based disc protrusion is again seen, asymmetric to the left. A central annular tear is present. No focal stenosis is present. L4-5: Mild disc bulging and facet hypertrophy present. The facet hypertrophy demonstrates some progression. No focal stenosis is present. L5-S1: Mild left facet hypertrophy is stable. No significant stenosis is present.   IMPRESSION: 1. Stable mild broad-based disc protrusion at L3-4, asymmetric to the left, without focal stenosis. 2. Progressive facet hypertrophy at L4-5 without focal stenosis. 3. Stable mild left  facet hypertrophy at L5-S1 without significant stenosis. 4. Progressive type 2 Modic changes posteriorly at L2-3. 5. Stable inferior endplate Schmorl's node at L4 with adjacent type 2 Modic changes. 6. Stable remote superior endplate fracture of T12 with fatty marrow changes.  _____________________________________________________________ ASSESSMENT/PLAN Diagnoses and all orders for this visit:  Lumbar pain -     Ambulatory referral to Physical Therapy  Signs of somatic dysfunction/asymmetry identified on exam, suspected multifactorial causes including DDD, deconditioning, glute weakness. Options discussed for further management, formal PT referral placed. Otc remedies reviewed. All questions answered. Return precautions discussed, anticipate f/u PRN for refractory, worsening, or new symptoms. Patient verbalized understanding and is in agreement with plan.    Electronically signed by: Burna Forts, MD 04/16/2023 9:58 AM

## 2023-05-15 ENCOUNTER — Encounter: Payer: Self-pay | Admitting: Family

## 2023-05-16 ENCOUNTER — Encounter: Payer: Self-pay | Admitting: Internal Medicine

## 2023-05-16 ENCOUNTER — Ambulatory Visit: Admitting: Internal Medicine

## 2023-05-16 VITALS — BP 110/80 | HR 89 | Temp 98.0°F | Ht 69.0 in | Wt 139.4 lb

## 2023-05-16 DIAGNOSIS — G4700 Insomnia, unspecified: Secondary | ICD-10-CM

## 2023-05-16 DIAGNOSIS — M79672 Pain in left foot: Secondary | ICD-10-CM | POA: Diagnosis not present

## 2023-05-16 MED ORDER — NAPROXEN 500 MG PO TABS: 500.0000 mg | ORAL_TABLET | Freq: Two times a day (BID) | ORAL | 0 refills | Status: AC

## 2023-05-16 MED ORDER — ZOLPIDEM TARTRATE 5 MG PO TABS: 5.0000 mg | ORAL_TABLET | Freq: Every evening | ORAL | 2 refills | Status: DC | PRN

## 2023-05-16 NOTE — Progress Notes (Signed)
 [facility] [address] [place] [address] Dept: [phone] Dept Fax: [phone]  Acute Care Office Visit  Subjective:   Roberta Sims [facility] 08/03/1970 05/16/2023  Chief Complaint  Patient presents with   Foot Pain    Left foot started 6 weeks ago     HPI: Discussed the use of AI scribe software for clinical note transcription with the patient, who gave verbal consent to proceed.  History of Present Illness   The patient presents with left foot pain that started approximately six weeks ago. The pain began after  stretching.  She then felt a pulling sensation on the bottom of the left foot. Initially, the pain was not bothersome and seemed to improve after a few days. However, over the past two weeks, the pain has worsened and has led to compensatory walking, causing associated leg and hip pain. The pain, initially localized to the bottom of the foot, has been spreading towards the front but does not extend to the heel or the top of the foot. The patient describes the pain as a burning sensation and denies any numbness. The patient has tried wearing an ankle brace for a couple of days without significant relief.       The following portions of the patient's history were reviewed and updated as appropriate: past medical history, past surgical history, family history, social history, allergies, medications, and problem list.   Patient Active Problem List   Diagnosis Date Noted   S/P repair of paraesophageal hernia 12/30/2022   Closed compression fracture of fourth lumbar vertebra (HCC) 05/23/2022   OSA (obstructive sleep apnea) 03/05/2022   IDA (iron deficiency anemia) 01/23/2022   Absolute anemia 01/04/2022   Insomnia 11/27/2021   Multiple closed pelvic fractures without disruption of pelvic circle (HCC) 11/27/2021   Closed nondisplaced zone III fracture of sacrum (HCC) 11/20/2021   Stress fracture of right femur  11/20/2021   Snoring 11/07/2021   Thoracogenic scoliosis of thoracic region 11/07/2021   GERD with apnea 11/07/2021   Nocturia 11/07/2021   Pelvic fracture (HCC) 11/06/2021   Osteoporosis 06/29/2021   Hyperlipidemia 06/29/2021   Acute bilateral thoracic back pain 03/07/2021   Compression fracture of T12 vertebra (HCC) 12/11/2020   Compression fracture of L1 lumbar vertebra (HCC) 12/11/2020   Estrogen deficiency 12/11/2020   Vitamin D deficiency 12/11/2020   History of thoracic spinal fusion 11/23/2020   Piriformis syndrome of left side 11/14/2020   GERD with stricture 03/04/2019   Esophageal dysphagia 01/07/2019   Essential familial hyperlipidemia 04/24/2018   Hypertriglyceridemia, familial 04/24/2018   Anxiety 04/23/2018   H/O scoliosis 04/23/2018   Impaired fasting glucose 04/23/2018   Exposure to hepatitis C 03/25/2018   Menopausal symptoms 03/25/2018   Pain of left side of body 03/25/2018   Hx of colonic polyps 06/28/2013   Past Medical History:  Diagnosis Date   Anemia    Anxiety    Complication of anesthesia    Compression fracture of thoracic spine, non-traumatic, sequela    Dysphagia    GERD with stricture 03/04/2019   Hx of sessile serrated colonic polyp 06/28/2013   Hyperlipidemia    Osteoporosis    PONV (postoperative nausea and vomiting)    Scoliosis    Uterine prolapse    Past Surgical History:  Procedure Laterality Date   ABDOMINAL HYSTERECTOMY     COLONOSCOPY     ESOPHAGOGASTRODUODENOSCOPY     harrington rod--scoliosis     PARTIAL HYSTERECTOMY  UPPER GI ENDOSCOPY N/A 12/30/2022   Procedure: UPPER GI ENDOSCOPY;  Surgeon: Gaynelle Adu, MD;  Location: [facility];  Service: General;  Laterality: N/A;   XI ROBOTIC ASSISTED HIATAL HERNIA REPAIR N/A 12/30/2022   Procedure: ROBOTIC REPAIR LARGE HIATAL HERNIA WITH WRAP;  Surgeon: Gaynelle Adu, MD;  Location: [facility];  Service: General;  Laterality: N/A;  210   Family History  Problem Relation Age of Onset    Pancreatic cancer Mother        metastasized to colon   Alcohol abuse Brother    Diabetes Brother    Cancer Maternal Aunt        breast   Colon cancer Maternal Grandmother     Current Outpatient Medications:    Ascorbic Acid (VITAMIN C) 100 MG tablet, Take 100 mg by mouth daily., Disp: , Rfl:    cetirizine (ZYRTEC) 10 MG tablet, Take 10 mg by mouth daily., Disp: , Rfl:    cyclobenzaprine (FLEXERIL) 10 MG tablet, Take 1 tablet (10 mg total) by mouth 3 (three) times daily as needed for muscle spasms., Disp: 90 tablet, Rfl: 1   escitalopram (LEXAPRO) 10 MG tablet, Take 1 tablet (10 mg total) by mouth daily., Disp: 90 tablet, Rfl: 1   fluconazole (DIFLUCAN) 150 MG tablet, Start 1 tablet after finishing antibiotics and another 3 days later if needed, Disp: 2 tablet, Rfl: 0   naproxen (NAPROSYN) 500 MG tablet, Take 1 tablet (500 mg total) by mouth 2 (two) times daily with a meal for 10 days., Disp: 20 tablet, Rfl: 0   NON FORMULARY, Cpap machine nightly, Disp: , Rfl:    ondansetron (ZOFRAN) 4 MG tablet, Take 1 tablet (4 mg total) by mouth every 8 (eight) hours as needed for nausea or vomiting., Disp: 20 tablet, Rfl: 1   zinc gluconate 50 MG tablet, Take 50 mg by mouth daily., Disp: , Rfl:    omeprazole (PRILOSEC) 40 MG capsule, TAKE ONE CAPSULE BY MOUTH TWICE A DAY (Patient not taking: Reported on 05/16/2023), Disp: 90 capsule, Rfl: 3   predniSONE (DELTASONE) 20 MG tablet, Take 2 tablets (40 mg total) by mouth daily with breakfast. (Patient not taking: Reported on 05/16/2023), Disp: 10 tablet, Rfl: 0   rosuvastatin (CRESTOR) 20 MG tablet, Take 1 tablet (20 mg total) by mouth daily. (Patient not taking: Reported on 05/16/2023), Disp: 90 tablet, Rfl: 1   zolpidem (AMBIEN) 5 MG tablet, Take 1 tablet (5 mg total) by mouth at bedtime as needed for sleep., Disp: 30 tablet, Rfl: 2 Allergies  Allergen Reactions   Meloxicam Other (See Comments)    Headache     ROS: A complete ROS was performed with  pertinent positives/negatives noted in the HPI. The remainder of the ROS are negative.    Objective:   Today's Vitals   05/16/23 0949  BP: 110/80  Pulse: 89  Temp: 98 F (36.7 C)  TempSrc: Temporal  SpO2: 97%  Weight: 139 lb 6.4 oz (63.2 kg)  Height: 5\' 9"  (1.753 m)    GENERAL: Well-appearing, in NAD. Well nourished.  SKIN: Pink, warm and dry. No rash. RESPIRATORY: Respirations even and non-labored.  CARDIAC: Peripheral pulses 2+ bilaterally.  MSK: Muscle tone and strength appropriate for age. Pain reproduced with left foot flexion . FROM left foot. EXTREMITIES: Without clubbing, cyanosis, or edema.  NEUROLOGIC: No motor or sensory deficits. Steady, even gait.  PSYCH/MENTAL STATUS: Alert, oriented x 3. Cooperative, appropriate mood and affect.    No results found for any  visits on 05/16/23.    Assessment & Plan:  Assessment and Plan    Left foot pain Suspected tendonitis due to inflammation, exacerbated by use and lack of rest. Naproxen chosen due to meloxicam adverse effects on patient (headache). Emphasized rest and supportive footwear. - Prescribe naproxen twice a day for 10 days with food. - Advise icing the affected area using a frozen water bottle. - Recommend wearing supportive shoes and a more supportive foot brace. - Advise rest and minimizing weight-bearing activities on the affected foot. - If no improvement in 1-2 weeks, consider referral to orthopedics and physical therapy.  Insomnia Requires Ambien for sleep, especially post-surgery. - Refill Ambien prescription at [facility] pharmacy.       Meds ordered this encounter  Medications   zolpidem (AMBIEN) 5 MG tablet    Sig: Take 1 tablet (5 mg total) by mouth at bedtime as needed for sleep.    Dispense:  30 tablet    Refill:  2   naproxen (NAPROSYN) 500 MG tablet    Sig: Take 1 tablet (500 mg total) by mouth 2 (two) times daily with a meal for 10 days.    Dispense:  20 tablet    Refill:  0    Supervising  Provider:   Garnette Gunner [[id number]]   No orders of the defined types were placed in this encounter.  Lab Orders  No laboratory test(s) ordered today   No images are attached to the encounter or orders placed in the encounter.  Return if symptoms worsen or fail to improve.   Salvatore Decent, FNP

## 2023-05-16 NOTE — Patient Instructions (Signed)
 Ice water bottle  Naproxen  Rest  Supportive shoes

## 2023-05-22 ENCOUNTER — Ambulatory Visit (INDEPENDENT_AMBULATORY_CARE_PROVIDER_SITE_OTHER): Payer: Commercial Managed Care - HMO | Admitting: *Deleted

## 2023-05-22 DIAGNOSIS — M810 Age-related osteoporosis without current pathological fracture: Secondary | ICD-10-CM

## 2023-05-22 MED ORDER — ROMOSOZUMAB-AQQG 105 MG/1.17ML ~~LOC~~ SOSY
210.0000 mg | PREFILLED_SYRINGE | Freq: Once | SUBCUTANEOUS | Status: AC
  Administered 2023-05-22: 210 mg via SUBCUTANEOUS

## 2023-05-22 NOTE — Progress Notes (Signed)
 Patient is here for nurse visit for her  10th Evenity. Patient received bilateral [place] injections.  She tolerated injections well. She will call us if needed and she will return in 1 month for her next Evenity injection.

## 2023-05-26 ENCOUNTER — Other Ambulatory Visit: Payer: Self-pay

## 2023-05-26 NOTE — Telephone Encounter (Signed)
 Requesting: Rosuvastatin 20mg  Last Visit: 03/13/2023 Next Visit: Visit date not found Last Refill: 11/26/2022  Please Advise    Patient reported on 05/16/2023 visit that she is not taking

## 2023-06-24 ENCOUNTER — Encounter: Payer: Self-pay | Admitting: *Deleted

## 2023-06-24 ENCOUNTER — Encounter: Payer: Self-pay | Admitting: Family

## 2023-06-30 ENCOUNTER — Ambulatory Visit: Admitting: Family Medicine

## 2023-06-30 ENCOUNTER — Encounter: Payer: Self-pay | Admitting: Family Medicine

## 2023-06-30 VITALS — Ht 69.0 in

## 2023-06-30 VITALS — Ht 69.0 in | Wt 134.0 lb

## 2023-06-30 DIAGNOSIS — M810 Age-related osteoporosis without current pathological fracture: Secondary | ICD-10-CM

## 2023-06-30 DIAGNOSIS — Z0000 Encounter for general adult medical examination without abnormal findings: Secondary | ICD-10-CM | POA: Diagnosis not present

## 2023-06-30 MED ORDER — ROMOSOZUMAB-AQQG 105 MG/1.17ML ~~LOC~~ SOSY
210.0000 mg | PREFILLED_SYRINGE | Freq: Once | SUBCUTANEOUS | Status: AC
  Administered 2023-06-30: 210 mg via SUBCUTANEOUS

## 2023-06-30 NOTE — Progress Notes (Signed)
 Patient is here for nurse visit for her #11 Evenity . Patient received bilateral [place] injections.  She tolerated injections well. She will call us  if needed and she will return in 1 month for her next Evenity  injection.

## 2023-06-30 NOTE — Progress Notes (Deleted)
 PCP: Odette Benjamin, NP  Subjective:   HPI: Patient is a 53 y.o. female here for osteoporosis consultation.  Has received Evenity  injections x 11    Past Medical History:  Diagnosis Date   Anemia    Anxiety    Complication of anesthesia    Compression fracture of thoracic spine, non-traumatic, sequela    Dysphagia    GERD with stricture 03/04/2019   Hx of sessile serrated colonic polyp 06/28/2013   Hyperlipidemia    Osteoporosis    PONV (postoperative nausea and vomiting)    Scoliosis    Uterine prolapse     Current Outpatient Medications on File Prior to Visit  Medication Sig Dispense Refill   Ascorbic Acid (VITAMIN C) 100 MG tablet Take 100 mg by mouth daily.     cetirizine (ZYRTEC) 10 MG tablet Take 10 mg by mouth daily.     cyclobenzaprine  (FLEXERIL ) 10 MG tablet Take 1 tablet (10 mg total) by mouth 3 (three) times daily as needed for muscle spasms. 90 tablet 1   escitalopram  (LEXAPRO ) 10 MG tablet Take 1 tablet (10 mg total) by mouth daily. 90 tablet 1   fluconazole  (DIFLUCAN ) 150 MG tablet Start 1 tablet after finishing antibiotics and another 3 days later if needed 2 tablet 0   NON FORMULARY Cpap machine nightly     omeprazole  (PRILOSEC) 40 MG capsule TAKE ONE CAPSULE BY MOUTH TWICE A DAY (Patient not taking: Reported on 05/16/2023) 90 capsule 3   ondansetron  (ZOFRAN ) 4 MG tablet Take 1 tablet (4 mg total) by mouth every 8 (eight) hours as needed for nausea or vomiting. 20 tablet 1   predniSONE  (DELTASONE ) 20 MG tablet Take 2 tablets (40 mg total) by mouth daily with breakfast. (Patient not taking: Reported on 05/16/2023) 10 tablet 0   rosuvastatin  (CRESTOR ) 20 MG tablet Take 1 tablet (20 mg total) by mouth daily. (Patient not taking: Reported on 05/16/2023) 90 tablet 1   zinc gluconate 50 MG tablet Take 50 mg by mouth daily.     zolpidem  (AMBIEN ) 5 MG tablet Take 1 tablet (5 mg total) by mouth at bedtime as needed for sleep. 30 tablet 2   No current  facility-administered medications on file prior to visit.    Past Surgical History:  Procedure Laterality Date   ABDOMINAL HYSTERECTOMY     COLONOSCOPY     ESOPHAGOGASTRODUODENOSCOPY     harrington rod--scoliosis     PARTIAL HYSTERECTOMY     UPPER GI ENDOSCOPY N/A 12/30/2022   Procedure: UPPER GI ENDOSCOPY;  Surgeon: Aldean Hummingbird, MD;  Location: [facility];  Service: General;  Laterality: N/A;   XI ROBOTIC ASSISTED HIATAL HERNIA REPAIR N/A 12/30/2022   Procedure: ROBOTIC REPAIR LARGE HIATAL HERNIA WITH WRAP;  Surgeon: Aldean Hummingbird, MD;  Location: [facility];  Service: General;  Laterality: N/A;  210    Allergies  Allergen Reactions   Meloxicam  Other (See Comments)    Headache    There were no vitals taken for this visit.      No data to display              No data to display              Objective:  Physical Exam:  Gen: NAD, comfortable in exam room  ***   Assessment & Plan:  1. ***

## 2023-06-30 NOTE — Telephone Encounter (Signed)
 Re-verified patient's Evenity  benefits for updated insurance via [employer] portal. I called [employer] at [phone]- spoke to Satin M. No PA is required for J3111 or CPT [phone].

## 2023-06-30 NOTE — Progress Notes (Signed)
 PCP: Odette Benjamin, NP  Subjective:   HPI: Patient is a 53 y.o. female here for osteoporosis.  Patient returns to discuss next steps once she completes Evenity  treatment next month.  She has been tolerating this well. Prior treatment: none History of Hip, Spine, or Wrist Fracture: yes - pelvic, spine, and hip Heart disease or stroke: no Cancer: no Kidney Disease: no Gastric/Peptic Ulcer: yes Gastric bypass surgery: no Severe GERD: yes with recent hiatal hernia surgery History of seizures: no Age at Menopause: 50+ Calcium  intake: none Vitamin D  intake: none Hormone replacement therapy: starting now Smoking history: never Alcohol: 5/week Exercise: walking Major dental work in past year: no Parents with hip/spine fracture: no - maternal grandparent with multiple in his 5s  Past Medical History:  Diagnosis Date   Anemia    Anxiety    Complication of anesthesia    Compression fracture of thoracic spine, non-traumatic, sequela    Dysphagia    GERD with stricture 03/04/2019   Hx of sessile serrated colonic polyp 06/28/2013   Hyperlipidemia    Osteoporosis    PONV (postoperative nausea and vomiting)    Scoliosis    Uterine prolapse     Current Outpatient Medications on File Prior to Visit  Medication Sig Dispense Refill   Ascorbic Acid (VITAMIN C) 100 MG tablet Take 100 mg by mouth daily.     cetirizine (ZYRTEC) 10 MG tablet Take 10 mg by mouth daily.     cyclobenzaprine  (FLEXERIL ) 10 MG tablet Take 1 tablet (10 mg total) by mouth 3 (three) times daily as needed for muscle spasms. 90 tablet 1   escitalopram  (LEXAPRO ) 10 MG tablet Take 1 tablet (10 mg total) by mouth daily. 90 tablet 1   fluconazole  (DIFLUCAN ) 150 MG tablet Start 1 tablet after finishing antibiotics and another 3 days later if needed 2 tablet 0   NON FORMULARY Cpap machine nightly     omeprazole  (PRILOSEC) 40 MG capsule TAKE ONE CAPSULE BY MOUTH TWICE A DAY (Patient not taking: Reported on 05/16/2023) 90  capsule 3   ondansetron  (ZOFRAN ) 4 MG tablet Take 1 tablet (4 mg total) by mouth every 8 (eight) hours as needed for nausea or vomiting. 20 tablet 1   predniSONE  (DELTASONE ) 20 MG tablet Take 2 tablets (40 mg total) by mouth daily with breakfast. (Patient not taking: Reported on 05/16/2023) 10 tablet 0   rosuvastatin  (CRESTOR ) 20 MG tablet Take 1 tablet (20 mg total) by mouth daily. (Patient not taking: Reported on 05/16/2023) 90 tablet 1   zinc gluconate 50 MG tablet Take 50 mg by mouth daily.     zolpidem  (AMBIEN ) 5 MG tablet Take 1 tablet (5 mg total) by mouth at bedtime as needed for sleep. 30 tablet 2   No current facility-administered medications on file prior to visit.    Past Surgical History:  Procedure Laterality Date   ABDOMINAL HYSTERECTOMY     COLONOSCOPY     ESOPHAGOGASTRODUODENOSCOPY     harrington rod--scoliosis     PARTIAL HYSTERECTOMY     UPPER GI ENDOSCOPY N/A 12/30/2022   Procedure: UPPER GI ENDOSCOPY;  Surgeon: Aldean Hummingbird, MD;  Location: [facility];  Service: General;  Laterality: N/A;   XI ROBOTIC ASSISTED HIATAL HERNIA REPAIR N/A 12/30/2022   Procedure: ROBOTIC REPAIR LARGE HIATAL HERNIA WITH WRAP;  Surgeon: Aldean Hummingbird, MD;  Location: [facility];  Service: General;  Laterality: N/A;  210    Allergies  Allergen Reactions   Meloxicam  Other (See Comments)  Headache    Ht 5\' 9"  (1.753 m)   Wt 134 lb (60.8 kg)   BMI 19.79 kg/m       No data to display              No data to display              Objective:  Physical Exam:  Gen: NAD, comfortable in exam room  Dexa 12/14/20: R fem neck -2.6, L forearm -1.6 CMP 06/30/23: Ca 9.4, Cr 0.6   Assessment & Plan:  1. Osteoporossis - severe with multiple osteoporotic fractures of pelvis (left pubic body, sacrum, pubic bone), hip (right femoral neck), vertebrae (T11, T12, L4).  Completing evenity  treatment.  Will recheck bone density after this.  Calcium  and vitamin D  supplementation.  Discussed considering  tymlos/forteo, zoledronic acid, prolia.  History of ulcer, severe reflux, and hiatal hernia surgery excludes oral bisphosphonates.    Total visit time 25 minutes including documentation.

## 2023-06-30 NOTE — Patient Instructions (Signed)
 The medications we talked about today: zolderonic acid (IV medication x 5 years), prolia (shot every 6 months), and tymlos/forteo (shot you give yourself daily for 18-24 months). Get the bone density test after you complete treatment with evenity . Take calcium  1200mg  a daily, vitamin D  800 international units a day. Let us  know if you have any questions about the treatment options after our discussion today. Make sure you get your 25-OH Vitamin D  levels checked by your family doctor next month.

## 2023-07-02 ENCOUNTER — Encounter: Payer: Self-pay | Admitting: Family

## 2023-07-09 ENCOUNTER — Encounter: Payer: Self-pay | Admitting: Family

## 2023-07-28 ENCOUNTER — Ambulatory Visit (INDEPENDENT_AMBULATORY_CARE_PROVIDER_SITE_OTHER): Admitting: Nurse Practitioner

## 2023-07-28 ENCOUNTER — Ambulatory Visit: Payer: Self-pay

## 2023-07-28 ENCOUNTER — Encounter: Payer: Self-pay | Admitting: Nurse Practitioner

## 2023-07-28 VITALS — BP 132/74 | HR 88 | Temp 97.6°F | Ht 69.0 in | Wt 140.8 lb

## 2023-07-28 DIAGNOSIS — J069 Acute upper respiratory infection, unspecified: Secondary | ICD-10-CM

## 2023-07-28 DIAGNOSIS — J020 Streptococcal pharyngitis: Secondary | ICD-10-CM

## 2023-07-28 LAB — POC COVID19 BINAXNOW: SARS Coronavirus 2 Ag: NEGATIVE

## 2023-07-28 LAB — POCT RAPID STREP A (OFFICE): Rapid Strep A Screen: POSITIVE — AB

## 2023-07-28 MED ORDER — PENICILLIN V POTASSIUM 500 MG PO TABS: 500.0000 mg | ORAL_TABLET | Freq: Two times a day (BID) | ORAL | 0 refills | Status: AC

## 2023-07-28 NOTE — Telephone Encounter (Signed)
 Copied from CRM [id number]. Topic: Clinical - Red Word Triage >> Jul 28, 2023  8:55 AM Kita Perish H wrote: [facility] that prompted transfer to Nurse Triage: Ear pain, throat hurt can't hardly swallow, temp of 101, body aches  Chief Complaint: ear pain, body aches, sore throat, fever, difficulty swallowing Symptoms: see above Frequency: constant Pertinent Negatives: Patient denies cp, sob Disposition: ED /[] Urgent Care (no appt availability in office) / [x] Appointment(In office/virtual[] )/ []  [facility]/ [] Home Care/ [] Refused Recommended Disposition /[] [place] [facility]/ []  Follow-up with PCP Additional Notes: apt made for today; care advice given, denies questions; instructed to go to ER if becomes worse.   Reason for Disposition  Earache persists > 1 hour  Answer Assessment - Initial Assessment Questions 1. LOCATION: "Which ear is involved?"       Left ear is worse than right, both hurt 2. SENSATION: "Describe how the ear feels." (e.g. stuffy, full, plugged)."      Stuffy, hx sinus infection; sore throat, difficulty swallowing, temp 101, body aches 3. ONSET:  "When did the ear symptoms start?"       About a month ago 4. PAIN: "Do you also have an earache?" If Yes, ask: "How bad is it?" (Scale 1-10; or mild, moderate, severe)     severe 5. CAUSE: "What do you think is causing the ear congestion?"     congestion 6. URI: "Do you have a runny nose or cough?"      cough 7. NASAL ALLERGIES: "Are there symptoms of hay fever, such as sneezing or a clear nasal discharge?"     no 8. PREGNANCY: "Is there any chance you are pregnant?" "When was your last menstrual period?"     na  Protocols used: Ear - Congestion-A-AH

## 2023-07-28 NOTE — Telephone Encounter (Signed)
 Noted. Patient scheduled with Kathrene Parents, NP today.

## 2023-07-28 NOTE — Patient Instructions (Signed)
 URI Instructions: Encourage adequate oral hydration. Ok to use coricidin for sinus congestion Use mucinex  DM or Robitussin  or delsym for cough without sinus congestion  You can use plain "Tylenol " or "Advil" for fever, chills and achyness. Use cool mist humidifier at bedtime to help with nasal congestion and cough.  Cold/cough medications may have tylenol  or ibuprofen or guaifenesin  or dextromethophan in them, so be careful not to take beyond the recommended dose for each of these medications.

## 2023-07-28 NOTE — Progress Notes (Signed)
 Acute Office Visit  Subjective:    Patient ID: Roberta Sims, female    DOB: [birth year], 53 y.o.   MRN: [id number]  Chief Complaint  Patient presents with   Sore Throat    3 DAYS    Pain    BILATERAL EAR PAIN FOR 3 DAYS MORE LEFT EAR. BODY ACHES AND NAUSEA FOR 72 HOURS COUGH WORSE AT NIGHT      URI  This is a new problem. The current episode started in the past 7 days. The problem has been unchanged. The maximum temperature recorded prior to her arrival was 100.4 - 100.9 F. The fever has been present for Less than 1 day. Associated symptoms include congestion, coughing, ear pain, headaches, joint pain, a plugged ear sensation, rhinorrhea, sinus pain, a sore throat and swollen glands. Pertinent negatives include no abdominal pain, chest pain, diarrhea, dysuria, joint swelling, nausea, neck pain, rash, sneezing, vomiting or wheezing. She has tried NSAIDs for the symptoms. The treatment provided mild relief.   Outpatient Medications Prior to Visit  Medication Sig   Ascorbic Acid (VITAMIN C) 100 MG tablet Take 100 mg by mouth daily.   cetirizine (ZYRTEC) 10 MG tablet Take 10 mg by mouth daily.   cyclobenzaprine  (FLEXERIL ) 10 MG tablet Take 1 tablet (10 mg total) by mouth 3 (three) times daily as needed for muscle spasms.   escitalopram  (LEXAPRO ) 10 MG tablet Take 1 tablet (10 mg total) by mouth daily.   estrogens , conjugated, (PREMARIN ) 0.3 MG tablet Take 0.3 mg by mouth.   NON FORMULARY Cpap machine nightly   ondansetron  (ZOFRAN ) 4 MG tablet Take 1 tablet (4 mg total) by mouth every 8 (eight) hours as needed for nausea or vomiting.   VITAMIN D  PO Take by mouth.   zinc gluconate 50 MG tablet Take 50 mg by mouth as needed.   zolpidem  (AMBIEN ) 5 MG tablet Take 1 tablet (5 mg total) by mouth at bedtime as needed for sleep.   [DISCONTINUED] fluconazole  (DIFLUCAN ) 150 MG tablet Start 1 tablet after finishing antibiotics and another 3 days later if needed (Patient not taking: Reported on  07/28/2023)   [DISCONTINUED] omeprazole  (PRILOSEC) 40 MG capsule TAKE ONE CAPSULE BY MOUTH TWICE A DAY (Patient not taking: Reported on 05/16/2023)   [DISCONTINUED] predniSONE  (DELTASONE ) 20 MG tablet Take 2 tablets (40 mg total) by mouth daily with breakfast. (Patient not taking: Reported on 07/28/2023)   [DISCONTINUED] rosuvastatin  (CRESTOR ) 20 MG tablet Take 1 tablet (20 mg total) by mouth daily. (Patient not taking: Reported on 05/16/2023)   No facility-administered medications prior to visit.    Reviewed past medical and social history.  Review of Systems  HENT:  Positive for congestion, ear pain, rhinorrhea, sinus pain and sore throat. Negative for sneezing.   Respiratory:  Positive for cough. Negative for wheezing.   Cardiovascular:  Negative for chest pain.  Gastrointestinal:  Negative for abdominal pain, diarrhea, nausea and vomiting.  Genitourinary:  Negative for dysuria.  Musculoskeletal:  Positive for joint pain. Negative for neck pain.  Skin:  Negative for rash.  Neurological:  Positive for headaches.   Per HPI     Objective:    Physical Exam Vitals and nursing note reviewed.  Constitutional:      General: She is not in acute distress. HENT:     Right Ear: Tympanic membrane, ear canal and external ear normal.     Left Ear: Tympanic membrane, ear canal and external ear normal.     Nose:  No nasal tenderness, mucosal edema, congestion or rhinorrhea.     Right Nostril: No occlusion.     Left Nostril: No occlusion.     Right Turbinates: Not enlarged, swollen or pale.     Left Turbinates: Not enlarged, swollen or pale.     Right Sinus: No maxillary sinus tenderness or frontal sinus tenderness.     Left Sinus: No maxillary sinus tenderness or frontal sinus tenderness.     Mouth/Throat:     Mouth: Mucous membranes are moist.     Pharynx: Oropharynx is clear. Uvula midline. Posterior oropharyngeal erythema present. No oropharyngeal exudate, uvula swelling or postnasal drip.      Tonsils: No tonsillar exudate or tonsillar abscesses.  Eyes:     Extraocular Movements: Extraocular movements intact.     Conjunctiva/sclera: Conjunctivae normal.  Neck:     Thyroid : No thyroid  mass, thyromegaly or thyroid  tenderness.  Cardiovascular:     Rate and Rhythm: Normal rate and regular rhythm.     Pulses: Normal pulses.     Heart sounds: Normal heart sounds.  Pulmonary:     Effort: Pulmonary effort is normal.     Breath sounds: Normal breath sounds.  Musculoskeletal:     Cervical back: Normal range of motion and neck supple.  Lymphadenopathy:     Cervical: Cervical adenopathy present.  Skin:    Findings: No rash.  Neurological:     Mental Status: She is alert and oriented to person, place, and time.    BP 132/74 (BP Location: Left Arm, Patient Position: Sitting, Cuff Size: Large)   Pulse 88   Temp 97.6 F (36.4 C) (Temporal)   Ht 5\' 9"  (1.753 m)   Wt 140 lb 12.8 oz (63.9 kg)   SpO2 96%   BMI 20.79 kg/m    Results for orders placed or performed in visit on 07/28/23  POCT rapid strep A  Result Value Ref Range   Rapid Strep A Screen Positive (A) Negative  POC COVID-19 BinaxNow  Result Value Ref Range   SARS Coronavirus 2 Ag Negative Negative      Assessment & Plan:   Problem List Items Addressed This Visit   None Visit Diagnoses       Strep pharyngitis    -  Primary   Relevant Medications   penicillin v potassium (VEETID) 500 MG tablet   Other Relevant Orders   POCT rapid strep A (Completed)     Viral upper respiratory tract infection       Relevant Orders   POC COVID-19 BinaxNow (Completed)      Meds ordered this encounter  Medications   penicillin v potassium (VEETID) 500 MG tablet    Sig: Take 1 tablet (500 mg total) by mouth 2 (two) times daily for 10 days.    Dispense:  20 tablet    Refill:  0    Supervising Provider:   Christianna Cowman ALFRED [[id number]]   URI Instructions: Encourage adequate oral hydration. Ok to use coricidin for sinus  congestion Use mucinex  DM or Robitussin  or delsym for cough without sinus congestion  You can use plain "Tylenol " or "Advil" for fever, chills and achyness. Use cool mist humidifier at bedtime to help with nasal congestion and cough.  Return if symptoms worsen or fail to improve.  Kathrene Parents, NP

## 2023-08-01 ENCOUNTER — Ambulatory Visit: Admitting: Family Medicine

## 2023-08-04 ENCOUNTER — Ambulatory Visit: Admitting: Family Medicine

## 2023-08-04 DIAGNOSIS — M810 Age-related osteoporosis without current pathological fracture: Secondary | ICD-10-CM

## 2023-08-04 MED ORDER — ROMOSOZUMAB-AQQG 105 MG/1.17ML ~~LOC~~ SOSY
210.0000 mg | PREFILLED_SYRINGE | Freq: Once | SUBCUTANEOUS | Status: AC
  Administered 2023-08-04: 210 mg via SUBCUTANEOUS

## 2023-08-04 NOTE — Progress Notes (Signed)
 Patient is here for nurse visit for her #12 Evenity . Patient received bilateral [place] injections.  She tolerated injections well. She will call us  if needed and she will return in 1 month to discuss next steps  and BMD results with Dr Peggy Bowens.

## 2023-08-05 ENCOUNTER — Encounter: Payer: Self-pay | Admitting: Family

## 2023-08-20 DIAGNOSIS — R1032 Left lower quadrant pain: Secondary | ICD-10-CM | POA: Diagnosis not present

## 2023-08-20 DIAGNOSIS — B3731 Acute candidiasis of vulva and vagina: Secondary | ICD-10-CM | POA: Diagnosis not present

## 2023-08-20 DIAGNOSIS — R102 Pelvic and perineal pain: Secondary | ICD-10-CM | POA: Diagnosis not present

## 2023-08-20 DIAGNOSIS — N951 Menopausal and female climacteric states: Secondary | ICD-10-CM | POA: Diagnosis not present

## 2023-08-21 DIAGNOSIS — R92333 Mammographic heterogeneous density, bilateral breasts: Secondary | ICD-10-CM | POA: Diagnosis not present

## 2023-08-21 DIAGNOSIS — R921 Mammographic calcification found on diagnostic imaging of breast: Secondary | ICD-10-CM | POA: Diagnosis not present

## 2023-08-22 ENCOUNTER — Telehealth: Payer: Self-pay

## 2023-08-22 MED ORDER — ROSUVASTATIN CALCIUM 20 MG PO TABS: 20.0000 mg | ORAL_TABLET | Freq: Every day | ORAL | 3 refills | Status: AC

## 2023-08-22 NOTE — Telephone Encounter (Signed)
 Requesting: Rosuvastatin  20mg   Last Visit: 07/28/2023 Next Visit: Visit date not found Last Refill: 11/26/2022 Patient reported not taking 05/16/2023  Please Advise

## 2023-08-25 ENCOUNTER — Ambulatory Visit (HOSPITAL_BASED_OUTPATIENT_CLINIC_OR_DEPARTMENT_OTHER)
Admission: RE | Admit: 2023-08-25 | Discharge: 2023-08-25 | Disposition: A | Discharge status: Home / Self Care | Source: Ambulatory Visit | Attending: Family Medicine | Admitting: Family Medicine

## 2023-08-25 DIAGNOSIS — Z780 Asymptomatic menopausal state: Secondary | ICD-10-CM | POA: Diagnosis not present

## 2023-08-25 DIAGNOSIS — M810 Age-related osteoporosis without current pathological fracture: Secondary | ICD-10-CM | POA: Insufficient documentation

## 2023-08-28 ENCOUNTER — Ambulatory Visit: Payer: Self-pay | Admitting: Family Medicine

## 2023-09-01 ENCOUNTER — Encounter: Payer: Self-pay | Admitting: Family Medicine

## 2023-09-01 ENCOUNTER — Ambulatory Visit (INDEPENDENT_AMBULATORY_CARE_PROVIDER_SITE_OTHER): Admitting: Family Medicine

## 2023-09-01 VITALS — BP 120/92 | Ht 69.0 in | Wt 140.0 lb

## 2023-09-01 DIAGNOSIS — M810 Age-related osteoporosis without current pathological fracture: Secondary | ICD-10-CM | POA: Diagnosis not present

## 2023-09-01 NOTE — Progress Notes (Signed)
 PCP: Nedra Tinnie LABOR, NP  Subjective:   HPI: Patient is a 53 y.o. female here for osteoporosis follow-up.  Patient returns to discuss next steps after completing evenity .  Compared to Dexa from 12/14/20 she did not show improvement in the two similar measures (R femoral neck with T scores decreased from -2.6 to -3.0 and left forearm from -1.6 to -2.6). She does feel like the medication may have helped prevent a couple fractures when she fell and hit her side. She has researched the different medications and uncertain of direction - would like to have complete workup before deciding best course of action.  Past Medical History:  Diagnosis Date   Anemia    Anxiety    Complication of anesthesia    Compression fracture of thoracic spine, non-traumatic, sequela    Dysphagia    GERD with stricture 03/04/2019   Hx of sessile serrated colonic polyp 06/28/2013   Hyperlipidemia    Osteoporosis    PONV (postoperative nausea and vomiting)    Scoliosis    Uterine prolapse     Current Outpatient Medications on File Prior to Visit  Medication Sig Dispense Refill   Ascorbic Acid (VITAMIN C) 100 MG tablet Take 100 mg by mouth daily.     cetirizine (ZYRTEC) 10 MG tablet Take 10 mg by mouth daily.     cyclobenzaprine  (FLEXERIL ) 10 MG tablet Take 1 tablet (10 mg total) by mouth 3 (three) times daily as needed for muscle spasms. 90 tablet 1   escitalopram  (LEXAPRO ) 10 MG tablet Take 1 tablet (10 mg total) by mouth daily. 90 tablet 1   estrogens , conjugated, (PREMARIN ) 0.3 MG tablet Take 0.3 mg by mouth.     NON FORMULARY Cpap machine nightly     ondansetron  (ZOFRAN ) 4 MG tablet Take 1 tablet (4 mg total) by mouth every 8 (eight) hours as needed for nausea or vomiting. 20 tablet 1   rosuvastatin  (CRESTOR ) 20 MG tablet Take 1 tablet (20 mg total) by mouth daily. 90 tablet 3   VITAMIN D  PO Take by mouth.     zinc gluconate 50 MG tablet Take 50 mg by mouth as needed.     zolpidem  (AMBIEN ) 5 MG  tablet Take 1 tablet (5 mg total) by mouth at bedtime as needed for sleep. 30 tablet 2   No current facility-administered medications on file prior to visit.    Past Surgical History:  Procedure Laterality Date   ABDOMINAL HYSTERECTOMY     COLONOSCOPY     ESOPHAGOGASTRODUODENOSCOPY     harrington rod--scoliosis     PARTIAL HYSTERECTOMY     UPPER GI ENDOSCOPY N/A 12/30/2022   Procedure: UPPER GI ENDOSCOPY;  Surgeon: Tanda Locus, MD;  Location: [facility];  Service: General;  Laterality: N/A;   XI ROBOTIC ASSISTED HIATAL HERNIA REPAIR N/A 12/30/2022   Procedure: ROBOTIC REPAIR LARGE HIATAL HERNIA WITH WRAP;  Surgeon: Tanda Locus, MD;  Location: [facility];  Service: General;  Laterality: N/A;  210    Allergies  Allergen Reactions   Meloxicam  Other (See Comments)    Headache    BP (!) 120/92 (BP Location: Left Arm, Patient Position: Sitting)   Ht 5' 9 (1.753 m)   Wt 140 lb (63.5 kg)   BMI 20.67 kg/m       No data to display              No data to display  Objective:  Physical Exam:  Gen: NAD, comfortable in exam room  Dexa T scores 08/25/23: L neck -2.7, L total -2.8, R neck -3.0 (from -2.6), R total -2.7, L forearm -2.6 (from -1.6).   Assessment & Plan:  1. Severe osteoporosis - completed evenity  with bone density unfortunately not improving - worsening despite medication.  Will check labwork (CBC, CMP, TSH, PTH, 25-OH Vit D, SPEP).  Discussed tymlos/forteo, prolia, bisphosphonates.  Will review labs and then discuss next steps.  Total visit time 20 minutes including documentation.

## 2023-09-02 DIAGNOSIS — M810 Age-related osteoporosis without current pathological fracture: Secondary | ICD-10-CM | POA: Diagnosis not present

## 2023-09-04 ENCOUNTER — Ambulatory Visit: Payer: Self-pay | Admitting: Family Medicine

## 2023-09-04 LAB — COMPREHENSIVE METABOLIC PANEL WITH GFR
ALT: 9 IU/L (ref 0–32)
AST: 16 IU/L (ref 0–40)
Albumin: 4.2 g/dL (ref 3.8–4.9)
Alkaline Phosphatase: 107 IU/L (ref 44–121)
BUN/Creatinine Ratio: 27 — ABNORMAL HIGH (ref 9–23)
BUN: 18 mg/dL (ref 6–24)
Bilirubin Total: 0.3 mg/dL (ref 0.0–1.2)
CO2: 23 mmol/L (ref 20–29)
Calcium: 9.3 mg/dL (ref 8.7–10.2)
Chloride: 101 mmol/L (ref 96–106)
Creatinine, Ser: 0.66 mg/dL (ref 0.57–1.00)
Globulin, Total: 2.4 g/dL (ref 1.5–4.5)
Glucose: 87 mg/dL (ref 70–99)
Potassium: 4.4 mmol/L (ref 3.5–5.2)
Sodium: 140 mmol/L (ref 134–144)
Total Protein: 6.6 g/dL (ref 6.0–8.5)
eGFR: 104 mL/min/1.73 (ref >59)

## 2023-09-04 LAB — CBC WITH DIFFERENTIAL/PLATELET
Basophils Absolute: 0.1 x10E3/uL (ref 0.0–0.2)
Basos: 1 %
EOS (ABSOLUTE): 0.9 x10E3/uL — ABNORMAL HIGH (ref 0.0–0.4)
Eos: 12 %
Hematocrit: 38.5 % (ref 34.0–46.6)
Hemoglobin: 13 g/dL (ref 11.1–15.9)
Immature Grans (Abs): 0 x10E3/uL (ref 0.0–0.1)
Immature Granulocytes: 0 %
Lymphocytes Absolute: 2.2 x10E3/uL (ref 0.7–3.1)
Lymphs: 29 %
MCH: 32.8 pg (ref 26.6–33.0)
MCHC: 33.8 g/dL (ref 31.5–35.7)
MCV: 97 fL (ref 79–97)
Monocytes Absolute: 0.7 x10E3/uL (ref 0.1–0.9)
Monocytes: 9 %
Neutrophils Absolute: 3.6 x10E3/uL (ref 1.4–7.0)
Neutrophils: 49 %
Platelets: 230 x10E3/uL (ref 150–450)
RBC: 3.96 x10E6/uL (ref 3.77–5.28)
RDW: 11.8 % (ref 11.7–15.4)
WBC: 7.5 x10E3/uL (ref 3.4–10.8)

## 2023-09-04 LAB — TSH: TSH: 1.59 u[IU]/mL (ref 0.450–4.500)

## 2023-09-04 LAB — PROTEIN ELECTROPHORESIS, SERUM
A/G Ratio: 1.4 (ref 0.7–1.7)
Albumin ELP: 3.8 g/dL (ref 2.9–4.4)
Alpha 1: 0.2 g/dL (ref 0.0–0.4)
Alpha 2: 0.8 g/dL (ref 0.4–1.0)
Beta: 1 g/dL (ref 0.7–1.3)
Gamma Globulin: 0.8 g/dL (ref 0.4–1.8)
Globulin, Total: 2.8 g/dL (ref 2.2–3.9)

## 2023-09-04 LAB — VITAMIN D 25 HYDROXY (VIT D DEFICIENCY, FRACTURES): Vit D, 25-Hydroxy: 23.9 ng/mL — ABNORMAL LOW (ref 30.0–100.0)

## 2023-09-04 LAB — PARATHYROID HORMONE, INTACT (NO CA): PTH: 40 pg/mL (ref 15–65)

## 2023-09-11 ENCOUNTER — Ambulatory Visit: Admitting: Student

## 2023-09-11 ENCOUNTER — Encounter: Payer: Self-pay | Admitting: Student

## 2023-09-11 ENCOUNTER — Ambulatory Visit: Payer: Self-pay

## 2023-09-11 VITALS — BP 114/68 | HR 79 | Temp 97.8°F | Ht 69.0 in | Wt 141.0 lb

## 2023-09-11 DIAGNOSIS — J069 Acute upper respiratory infection, unspecified: Secondary | ICD-10-CM | POA: Diagnosis not present

## 2023-09-11 DIAGNOSIS — R519 Headache, unspecified: Secondary | ICD-10-CM | POA: Diagnosis not present

## 2023-09-11 MED ORDER — KETOROLAC TROMETHAMINE 60 MG/2ML IM SOLN: 60.0000 mg | Freq: Once | INTRAMUSCULAR | Status: DC

## 2023-09-11 MED ORDER — KETOROLAC TROMETHAMINE 60 MG/2ML IM SOLN
60.0000 mg | Freq: Once | INTRAMUSCULAR | Status: AC
  Administered 2023-09-11: 60 mg via INTRAMUSCULAR

## 2023-09-11 NOTE — Telephone Encounter (Signed)
 FYI Only or Action Required?: Action required by provider: request for appointment.  Patient was last seen in primary care on 07/28/2023 by Nche, Roselie Rockford, NP.  Called Nurse Triage reporting Facial Swelling.  Symptoms began yesterday.  Interventions attempted: Nothing.  Symptoms are: unchanged.Yesterday started having left side facial swelling, ear pain. Warm transfer to [facility] in the practice for appointment.  Triage Disposition: See Physician Within 24 Hours  Patient/caregiver understands and will follow disposition?: Yes    Copied from CRM [id number]. Topic: Clinical - Red Word Triage >> Sep 11, 2023  9:08 AM Robinson H wrote: [facility] that prompted transfer to Nurse Triage: Whole left side of face swollen and hurts tonsil, gland ear, neck front side of face. Has sever TMJ and thought it was related to that Reason for Disposition  Face swelling is painful to touch  Answer Assessment - Initial Assessment Questions 1. ONSET: When did the swelling start? (e.g., minutes, hours, days)     Yesterday 2. LOCATION: What part of the face is swollen? (e.g., cheek, entire face, jaw joint area, under jaw)     Left 3. SEVERITY: How swollen is it?     Moderate 4. ITCHING: Is there any itching? If Yes, ask: How much?   (Scale 1-10; mild, moderate or severe)     no 5. PAIN: Is the swelling painful to touch? If Yes, ask: How painful is it?   (Scale 0-10; mild, moderate or severe)     6 6. FEVER: Do you have a fever? If Yes, ask: What is it, how was it measured, and when did it start?      no 7. CAUSE: What do you think is causing the face swelling?     TMJ 8. NEW MEDICINES: Have there been any new medicines started recently?     no 9. RECURRENT SYMPTOM: Have you had face swelling before? If Yes, ask: When was the last time? What happened that time?     yes 10. OTHER SYMPTOMS: Do you have any other symptoms? (e.g., leg swelling, toothache)       Ear  pain 11. PREGNANCY: Is there any chance you are pregnant? When was your last menstrual period?       no  Protocols used: Face Swelling-A-AH

## 2023-09-11 NOTE — Progress Notes (Signed)
 Acute Office Visit  Subjective:     Patient ID: Roberta Sims, female    DOB: 1970-03-23, 53 y.o.   MRN: [id number]  Chief Complaint  Patient presents with   Sinus Problem    Pt is having facial pain and it all in her left side and this started this past Monday. Tuesday states that on Tuesday she was eating her jaw almost popped out and through out the day it was swollen and giving her pain     HPI Patient is in today for acute visit.  She has very of TMJ syndrome exacerbation, chronic issue, Hx of allergies.  Presents with c/o URI symptoms starting 3 days ago.  Her current symptoms include pain on the left side of her face, left ear discomfort, and mild sinus pressure primarily localized to the left side. She has a known history of temporomandibular joint (TMJ) syndrome, which she notes may be contributing to or worsening her facial discomfort. She has been managing symptoms at home intermittently with Tylenol  and ibuprofen, which provide partial relief.  She denies fever, chills, sore throat, significant nasal congestion, productive cough, shortness of breath, nausea, vomiting, or recent dental issues. No visual changes or dizziness reported.   Allergies 10 mg Zyrtec Daily   Patient denies fever, chills, SOB, CP, palpitations, dyspnea, edema, HA, vision changes, N/V/D, abdominal pain, urinary symptoms, rash, weight changes, and recent illness or hospitalizations.      ROS  See HPI    Objective:    BP 114/68 (BP Location: Left Arm, Patient Position: Sitting, Cuff Size: Normal)   Pulse 79   Temp 97.8 F (36.6 C) (Oral)   Ht 5' 9 (1.753 m)   Wt 141 lb (64 kg)   SpO2 99%   BMI 20.82 kg/m    Physical Exam  No results found for any visits on 09/11/23.      Assessment & Plan:   Problem List Items Addressed This Visit     Left-sided face pain - Primary   Likely r/t TMJ.  Ketorolac  60 mg IM given in office.   Alternate Tylenol  (acetaminophen ) and ibuprofen for  pain relief as needed:  -Tylenol  (acetaminophen ): Take 500-1,000 mg every 6 hours as needed. Do not exceed 3,000 mg in 24 hours. -Ibuprofen (Advil/Motrin): Take 400-600 mg every 6-8 hours as needed. Take with food to minimize stomach upset. Do not exceed 2,400 mg in 24 hours.        Viral URI   Patient educated that antibiotics are not indicated as this is likely viral and self-limiting, with expected resolution in 7-10 days. Instructed to return if symptoms worsen, persist beyond 10 days, or if experiencing red flag symptoms such as high fever >102F, ear pain, facial swelling, shortness of breath, or chest pain.Ensure adequate hydration. Supportive care recommended: maintain adequate hydration, rest, and consider use of humidifier. Symptomatic treatment with OTC medications as appropriate:.  Saline nasal spray, decongestants, throat lozenges for nasal congestion/sore throat. Advised close monitoring for red flag symptoms: persistent fever >101F beyond 3 days, SOB, or worsening symptoms beyond 10 days- RTC.  Provided return precautions and RTC if symptoms worsen or fail to improve.         Allergies OTC Flonase  nasal spray daily.  Portions of this note were dictated using DRAGON voice recognition software. Please disregard any errors in transcription.    Patient was educated on the diagnosis, treatment options, potential risks, benefits, and alternatives. All questions were addressed. Patient verbalized understanding and  agrees with the plan of care. Will follow up as advised or sooner if symptoms worsen or new concerns arise.     Meds ordered this encounter  Medications   DISCONTD: ketorolac  (TORADOL ) injection 60 mg   ketorolac  (TORADOL ) injection 60 mg    No follow-ups on file.  Renie Stelmach L Ronte Parker, NP

## 2023-09-11 NOTE — Assessment & Plan Note (Signed)
 Patient educated that antibiotics are not indicated as this is likely viral and self-limiting, with expected resolution in 7-10 days. Instructed to return if symptoms worsen, persist beyond 10 days, or if experiencing red flag symptoms such as high fever >102F, ear pain, facial swelling, shortness of breath, or chest pain.Ensure adequate hydration. Supportive care recommended: maintain adequate hydration, rest, and consider use of humidifier. Symptomatic treatment with OTC medications as appropriate:.  Saline nasal spray, decongestants, throat lozenges for nasal congestion/sore throat. Advised close monitoring for red flag symptoms: persistent fever >101F beyond 3 days, SOB, or worsening symptoms beyond 10 days- RTC.  Provided return precautions and RTC if symptoms worsen or fail to improve.

## 2023-09-11 NOTE — Assessment & Plan Note (Signed)
 Likely r/t TMJ.  Ketorolac  60 mg IM given in office.   Alternate Tylenol  (acetaminophen ) and ibuprofen for pain relief as needed:  -Tylenol  (acetaminophen ): Take 500-1,000 mg every 6 hours as needed. Do not exceed 3,000 mg in 24 hours. -Ibuprofen (Advil/Motrin): Take 400-600 mg every 6-8 hours as needed. Take with food to minimize stomach upset. Do not exceed 2,400 mg in 24 hours.

## 2023-09-12 ENCOUNTER — Ambulatory Visit: Admitting: Family Medicine

## 2023-09-16 ENCOUNTER — Other Ambulatory Visit: Payer: Self-pay | Admitting: *Deleted

## 2023-09-16 DIAGNOSIS — M810 Age-related osteoporosis without current pathological fracture: Secondary | ICD-10-CM

## 2023-09-16 MED ORDER — VITAMIN D (ERGOCALCIFEROL) 1.25 MG (50000 UNIT) PO CAPS
50000.0000 [IU] | ORAL_CAPSULE | ORAL | 0 refills | Status: DC
Start: 2023-09-16 — End: 2023-12-25

## 2023-09-25 DIAGNOSIS — M47816 Spondylosis without myelopathy or radiculopathy, lumbar region: Secondary | ICD-10-CM | POA: Diagnosis not present

## 2023-09-25 DIAGNOSIS — M5416 Radiculopathy, lumbar region: Secondary | ICD-10-CM | POA: Diagnosis not present

## 2023-09-25 DIAGNOSIS — M533 Sacrococcygeal disorders, not elsewhere classified: Secondary | ICD-10-CM | POA: Diagnosis not present

## 2023-09-25 DIAGNOSIS — G8929 Other chronic pain: Secondary | ICD-10-CM | POA: Diagnosis not present

## 2023-10-16 ENCOUNTER — Other Ambulatory Visit: Payer: Self-pay | Admitting: Family

## 2023-10-16 DIAGNOSIS — F419 Anxiety disorder, unspecified: Secondary | ICD-10-CM

## 2023-10-21 ENCOUNTER — Other Ambulatory Visit: Payer: Self-pay | Admitting: Physician Assistant

## 2023-10-22 DIAGNOSIS — M5440 Lumbago with sciatica, unspecified side: Secondary | ICD-10-CM | POA: Diagnosis not present

## 2023-10-22 DIAGNOSIS — G8929 Other chronic pain: Secondary | ICD-10-CM | POA: Diagnosis not present

## 2023-10-22 DIAGNOSIS — R29898 Other symptoms and signs involving the musculoskeletal system: Secondary | ICD-10-CM | POA: Diagnosis not present

## 2023-10-22 DIAGNOSIS — M5416 Radiculopathy, lumbar region: Secondary | ICD-10-CM | POA: Diagnosis not present

## 2023-11-03 ENCOUNTER — Encounter: Payer: Self-pay | Admitting: Family

## 2023-11-03 ENCOUNTER — Encounter: Payer: Self-pay | Admitting: Family Medicine

## 2023-11-05 ENCOUNTER — Ambulatory Visit: Payer: Self-pay | Admitting: Nurse Practitioner

## 2023-11-05 ENCOUNTER — Ambulatory Visit (INDEPENDENT_AMBULATORY_CARE_PROVIDER_SITE_OTHER): Admitting: Nurse Practitioner

## 2023-11-05 ENCOUNTER — Encounter: Payer: Self-pay | Admitting: Nurse Practitioner

## 2023-11-05 VITALS — BP 116/72 | HR 89 | Temp 97.6°F | Ht 69.0 in | Wt 147.2 lb

## 2023-11-05 DIAGNOSIS — Z0000 Encounter for general adult medical examination without abnormal findings: Secondary | ICD-10-CM | POA: Diagnosis not present

## 2023-11-05 DIAGNOSIS — F419 Anxiety disorder, unspecified: Secondary | ICD-10-CM

## 2023-11-05 DIAGNOSIS — D500 Iron deficiency anemia secondary to blood loss (chronic): Secondary | ICD-10-CM

## 2023-11-05 DIAGNOSIS — E559 Vitamin D deficiency, unspecified: Secondary | ICD-10-CM

## 2023-11-05 DIAGNOSIS — R7303 Prediabetes: Secondary | ICD-10-CM | POA: Diagnosis not present

## 2023-11-05 DIAGNOSIS — M545 Low back pain, unspecified: Secondary | ICD-10-CM | POA: Diagnosis not present

## 2023-11-05 DIAGNOSIS — E785 Hyperlipidemia, unspecified: Secondary | ICD-10-CM | POA: Diagnosis not present

## 2023-11-05 DIAGNOSIS — G4700 Insomnia, unspecified: Secondary | ICD-10-CM

## 2023-11-05 DIAGNOSIS — M8000XG Age-related osteoporosis with current pathological fracture, unspecified site, subsequent encounter for fracture with delayed healing: Secondary | ICD-10-CM

## 2023-11-05 DIAGNOSIS — G8929 Other chronic pain: Secondary | ICD-10-CM

## 2023-11-05 LAB — CBC WITH DIFFERENTIAL/PLATELET
Basophils Absolute: 0.1 K/uL (ref 0.0–0.1)
Basophils Relative: 1.2 % (ref 0.0–3.0)
Eosinophils Absolute: 0.8 K/uL — ABNORMAL HIGH (ref 0.0–0.7)
Eosinophils Relative: 11.7 % — ABNORMAL HIGH (ref 0.0–5.0)
HCT: 41.5 % (ref 36.0–46.0)
Hemoglobin: 13.8 g/dL (ref 12.0–15.0)
Lymphocytes Relative: 30.5 % (ref 12.0–46.0)
Lymphs Abs: 2.1 K/uL (ref 0.7–4.0)
MCHC: 33.2 g/dL (ref 30.0–36.0)
MCV: 99.3 fl (ref 78.0–100.0)
Monocytes Absolute: 0.6 K/uL (ref 0.1–1.0)
Monocytes Relative: 8.1 % (ref 3.0–12.0)
Neutro Abs: 3.4 K/uL (ref 1.4–7.7)
Neutrophils Relative %: 48.5 % (ref 43.0–77.0)
Platelets: 206 K/uL (ref 150.0–400.0)
RBC: 4.18 Mil/uL (ref 3.87–5.11)
RDW: 13.3 % (ref 11.5–15.5)
WBC: 7.1 K/uL (ref 4.0–10.5)

## 2023-11-05 LAB — COMPREHENSIVE METABOLIC PANEL WITH GFR
ALT: 11 U/L (ref 0–35)
AST: 16 U/L (ref 0–37)
Albumin: 4.2 g/dL (ref 3.5–5.2)
Alkaline Phosphatase: 75 U/L (ref 39–117)
BUN: 15 mg/dL (ref 6–23)
CO2: 31 meq/L (ref 19–32)
Calcium: 9.5 mg/dL (ref 8.4–10.5)
Chloride: 102 meq/L (ref 96–112)
Creatinine, Ser: 0.62 mg/dL (ref 0.40–1.20)
GFR: 100.31 mL/min (ref >60.00)
Glucose, Bld: 73 mg/dL (ref 70–99)
Potassium: 4.7 meq/L (ref 3.5–5.1)
Sodium: 139 meq/L (ref 135–145)
Total Bilirubin: 0.3 mg/dL (ref 0.2–1.2)
Total Protein: 6.9 g/dL (ref 6.0–8.3)

## 2023-11-05 LAB — VITAMIN D 25 HYDROXY (VIT D DEFICIENCY, FRACTURES): VITD: 33.17 ng/mL (ref 30.00–100.00)

## 2023-11-05 LAB — HEMOGLOBIN A1C: Hgb A1c MFr Bld: 6 % (ref 4.6–6.5)

## 2023-11-05 LAB — LIPID PANEL
Cholesterol: 252 mg/dL — ABNORMAL HIGH (ref 0–200)
HDL: 56.8 mg/dL (ref >39.00)
LDL Cholesterol: 138 mg/dL — ABNORMAL HIGH (ref 0–99)
NonHDL: 194.86
Total CHOL/HDL Ratio: 4
Triglycerides: 283 mg/dL — ABNORMAL HIGH (ref 0.0–149.0)
VLDL: 56.6 mg/dL — ABNORMAL HIGH (ref 0.0–40.0)

## 2023-11-05 LAB — FERRITIN: Ferritin: 39.6 ng/mL (ref 10.0–291.0)

## 2023-11-05 LAB — IRON: Iron: 86 ug/dL (ref 42–145)

## 2023-11-05 MED ORDER — ESCITALOPRAM OXALATE 10 MG PO TABS: 10.0000 mg | ORAL_TABLET | Freq: Every day | ORAL | 1 refills | Status: AC

## 2023-11-05 MED ORDER — ZOLPIDEM TARTRATE 5 MG PO TABS: 5.0000 mg | ORAL_TABLET | Freq: Every evening | ORAL | 2 refills | Status: DC | PRN

## 2023-11-05 NOTE — Assessment & Plan Note (Signed)
 Chronic insomnia involves difficulty falling asleep and frequent awakenings. Lyrica helps but causes drowsiness. Ambien  is effective but out of stock. Melatonin causes vivid dreams at higher doses. Consider using Lyrica in the evening to aid sleep. Continue melatonin as needed, avoiding higher doses to prevent vivid dreams. Ambien  5mg  daily as needed at bedtime refilled. PDMP reviewed.

## 2023-11-05 NOTE — Patient Instructions (Signed)
 It was great to see you!  We are checking your labs today and will let you know the results via mychart/phone.   I have refilled your medications   You can try taking rosuvastatin  for cholesterol 3 times a week if you get muscle pain  Let's follow-up in 1 year, sooner if you have concerns.  If a referral was placed today, you will be contacted for an appointment. Please note that routine referrals can sometimes take up to 3-4 weeks to process. Please call our office if you haven't heard anything after this time frame.  Take care,  Tinnie Harada, NP

## 2023-11-05 NOTE — Assessment & Plan Note (Signed)
 Hyperlipidemia is present with previous non-compliance due to bone pain. Hereditary factors and lifestyle modifications were discussed. Restart rosuvastatin  20mg  with a modified dosing schedule of three times a week to minimize bone pain. Check CMP, CBC, lipid panel today. Follow-up in 3 months.

## 2023-11-05 NOTE — Assessment & Plan Note (Signed)
 Most recent DEXA scan showed T score -3.0. Prior 2 years ago was -2.6. Osteoporosis is worsening despite Evenity . Forteo initiation was discussed along with muscle building, protein intake, and weight-bearing exercises. Start Forteo after consulting with Dr. Cleatrice. Continue muscle building exercises and increase protein intake. Check vitamin D  levels today.

## 2023-11-05 NOTE — Assessment & Plan Note (Signed)
 Increased anxiety and irritability may be due to being out of Lexapro . Refill Lexapro  10mg  daily prescription.

## 2023-11-05 NOTE — Assessment & Plan Note (Signed)
She has received some infusions for iron.  Will check CBC and iron panel today.

## 2023-11-05 NOTE — Assessment & Plan Note (Signed)
Check A1c today and treat based on results.  

## 2023-11-05 NOTE — Progress Notes (Signed)
 BP 116/72 (BP Location: Left Arm, Patient Position: Sitting, Cuff Size: Normal)   Pulse 89   Temp 97.6 F (36.4 C)   Ht 5' 9 (1.753 m)   Wt 147 lb 3.2 oz (66.8 kg)   SpO2 99%   BMI 21.74 kg/m    Subjective:    Patient ID: Roberta Sims, female    DOB: 14-Aug-1970, 53 y.o.   MRN: [id number]  CC: Chief Complaint  Patient presents with   Annual Exam    With fasting labs, Rx refills, questions about labs    HPI: Roberta Sims is a 53 y.o. female presenting on 11/05/2023 for comprehensive medical examination. Current medical complaints include:osteoporosis  Discussed the use of AI scribe software for clinical note transcription with the patient, who gave verbal consent to proceed.  Her recent DEXA scan indicates worsening bone density despite completing a year of Evenity  treatment. She has not taken osteoporosis medication for the past two months. She has not experienced any falls or fractures in the past 15 months. She is following with sports medicine for this.   Severe pain is present related to the rod in her back and hip, with a noted loss of two inches in height over the past two years. She has started physical therapy and takes Lyrica in the evening for pain management. She completed a six-week course of vitamin D  supplementation and is awaiting lab results to assess her current status. She is focusing on increasing protein intake and muscle building to support bone health.   She experiences shortness of breath, increased irritability, and stress, which she associates with being out of Lexapro . She has difficulty sleeping, often waking up tired and needing to urinate frequently at night. Lyrica helps her sleep when taken in the evening. She has a history of elevated cholesterol but has not been taking her medication due to concerns about bone pain. She plans to resume medication and make lifestyle changes to manage cholesterol levels. She is considering getting the shingles  vaccine due to recent severe cases in close contacts.     Depression and Anxiety Screen done today and results listed below:     11/05/2023   11:30 AM 09/11/2023    1:56 PM 05/16/2023    9:49 AM 11/06/2022   10:23 AM 03/05/2022   10:52 AM  Depression screen PHQ 2/9  Decreased Interest 0 0 0 0 0  Down, Depressed, Hopeless 0 0 0 0 0  PHQ - 2 Score 0 0 0 0 0  Altered sleeping 3   3 1   Tired, decreased energy 2   1 1   Change in appetite 0   0 1  Feeling bad or failure about yourself  0   0 0  Trouble concentrating 0   1 1  Moving slowly or fidgety/restless 0   0 0  Suicidal thoughts 0   0 0  PHQ-9 Score 5   5 4   Difficult doing work/chores Somewhat difficult   Not difficult at all Somewhat difficult      11/05/2023   11:30 AM 11/06/2022   10:23 AM 03/05/2022   10:52 AM 11/26/2021    4:39 PM  GAD 7 : Generalized Anxiety Score  Nervous, Anxious, on Edge 1 0 1 0  Control/stop worrying 0 0 0 0  Worry too much - different things 1 1 0 0  Trouble relaxing 0 0 1 0  Restless 0 0 0 0  Easily annoyed or irritable 2 1  1 1  Afraid - awful might happen 0 0 0 0  Total GAD 7 Score 4 2 3 1   Anxiety Difficulty Somewhat difficult Not difficult at all Not difficult at all Very difficult    The patient does not have a history of falls. I did not complete a risk assessment for falls. A plan of care for falls was not documented.   Past Medical History:  Past Medical History:  Diagnosis Date   Allergy    Seasonal   Anemia    Anxiety    Arthritis    Knees   Blood transfusion without reported diagnosis 1985   Complication of anesthesia    Compression fracture of thoracic spine, non-traumatic, sequela    Dysphagia    GERD with stricture 03/04/2019   Hx of sessile serrated colonic polyp 06/28/2013   Hyperlipidemia    Osteoporosis    PONV (postoperative nausea and vomiting)    Scoliosis    Sleep apnea 2016   need mew study to update info and get new script   Uterine prolapse     Surgical  History:  Past Surgical History:  Procedure Laterality Date   ABDOMINAL HYSTERECTOMY     COLONOSCOPY     ESOPHAGOGASTRODUODENOSCOPY     harrington rod--scoliosis     PARTIAL HYSTERECTOMY     SPINE SURGERY  1985   Scoliosis surgery   UPPER GI ENDOSCOPY N/A 12/30/2022   Procedure: UPPER GI ENDOSCOPY;  Surgeon: Tanda Locus, MD;  Location: [facility];  Service: General;  Laterality: N/A;   XI ROBOTIC ASSISTED HIATAL HERNIA REPAIR N/A 12/30/2022   Procedure: ROBOTIC REPAIR LARGE HIATAL HERNIA WITH WRAP;  Surgeon: Tanda Locus, MD;  Location: [facility];  Service: General;  Laterality: N/A;  210    Medications:  Current Outpatient Medications on File Prior to Visit  Medication Sig   Ascorbic Acid (VITAMIN C) 100 MG tablet Take 100 mg by mouth daily.   celecoxib (CELEBREX) 100 MG capsule Take 100 mg by mouth 2 (two) times daily.   cetirizine (ZYRTEC) 10 MG tablet Take 10 mg by mouth daily.   cyclobenzaprine  (FLEXERIL ) 10 MG tablet Take 1 tablet (10 mg total) by mouth 3 (three) times daily as needed for muscle spasms.   estrogens , conjugated, (PREMARIN ) 0.3 MG tablet Take 0.3 mg by mouth.   NON FORMULARY Cpap machine nightly   pregabalin (LYRICA) 50 MG capsule Take 50 mg by mouth as needed.   zinc gluconate 50 MG tablet Take 50 mg by mouth as needed.   omeprazole  (PRILOSEC) 40 MG capsule TAKE ONE CAPSULE BY MOUTH TWICE A DAY (Patient not taking: Reported on 11/05/2023)   ondansetron  (ZOFRAN ) 4 MG tablet Take 1 tablet (4 mg total) by mouth every 8 (eight) hours as needed for nausea or vomiting. (Patient not taking: Reported on 11/05/2023)   rosuvastatin  (CRESTOR ) 20 MG tablet Take 1 tablet (20 mg total) by mouth daily. (Patient not taking: Reported on 11/05/2023)   VITAMIN D  PO Take by mouth. (Patient not taking: Reported on 11/05/2023)   Vitamin D , Ergocalciferol , (DRISDOL ) 1.25 MG (50000 UNIT) CAPS capsule Take 1 capsule (50,000 Units total) by mouth every 7 (seven) days. (Patient not taking: Reported  on 11/05/2023)   No current facility-administered medications on file prior to visit.    Allergies:  Allergies  Allergen Reactions   Meloxicam  Other (See Comments)    Headache    Social History:  Social History   Socioeconomic History   Marital status: Significant  Other    Spouse name: Not on file   Number of children: 2   Years of education: 43   Highest education level: 12th grade  Occupational History   Occupation: self-employed    Associate Professor: OTHER    Comment: Contractor  Tobacco Use   Smoking status: Never   Smokeless tobacco: Never  Vaping Use   Vaping status: Never Used  Substance and Sexual Activity   Alcohol use: Yes    Comment: socially   Drug use: Never   Sexual activity: Yes    Birth control/protection: Surgical  Other Topics Concern   Not on file  Social History Narrative   Divorced, 2 sons born 1990 1995.  They are in [place] .   She is a self-employed Pharmacist, community.   0-1 alcoholic beverages a day never smoker no drug use no other tobacco 1-2 caffeinated beverages weekly   Right handed   Social Drivers of Health   Financial Resource Strain: Low Risk  (11/04/2023)   Overall Financial Resource Strain (CARDIA)    Difficulty of Paying Living Expenses: Not hard at all  Food Insecurity: No Food Insecurity (11/04/2023)   Hunger Vital Sign    Worried About Running Out of Food in the Last Year: Never true    Ran Out of Food in the Last Year: Never true  Transportation Needs: No Transportation Needs (11/04/2023)   PRAPARE - Administrator, Civil Service (Medical): No    Lack of Transportation (Non-Medical): No  Physical Activity: Sufficiently Active (11/04/2023)   Exercise Vital Sign    Days of Exercise per Week: 5 days    Minutes of Exercise per Session: 30 min  Stress: No Stress Concern Present (11/04/2023)   [employer] of Occupational Health - Occupational Stress Questionnaire    Feeling of  Stress: Only a little  Social Connections: Moderately Integrated (11/04/2023)   Social Connection and Isolation Panel    Frequency of Communication with Friends and Family: More than three times a week    Frequency of Social Gatherings with Friends and Family: Once a week    Attends Religious Services: More than 4 times per year    Active Member of [employer] or Organizations: No    Attends Engineer, structural: Not on file    Marital Status: Living with partner  Intimate Partner Violence: Not At Risk (12/30/2022)   Humiliation, Afraid, Rape, and Kick questionnaire    Fear of Current or Ex-Partner: No    Emotionally Abused: No    Physically Abused: No    Sexually Abused: No   Social History   Tobacco Use  Smoking Status Never  Smokeless Tobacco Never   Social History   Substance and Sexual Activity  Alcohol Use Yes   Comment: socially    Family History:  Family History  Problem Relation Age of Onset   Pancreatic cancer Mother        metastasized to colon   Arthritis Mother    Cancer Mother    Varicose Veins Mother    Alcohol abuse Brother    Diabetes Brother    Cancer Maternal Aunt        breast   Colon cancer Maternal Grandmother    Arthritis Maternal Grandmother    Cancer Maternal Grandmother    Vision loss Maternal Grandmother    Hearing loss Maternal Grandfather    Stroke Maternal Grandfather    Asthma Son     Past medical history,  surgical history, medications, allergies, family history and social history reviewed with patient today and changes made to appropriate areas of the chart.   Review of Systems  Constitutional: Negative.   HENT: Negative.    Eyes: Negative.   Respiratory: Negative.    Cardiovascular: Negative.   Gastrointestinal: Negative.   Genitourinary: Negative.   Musculoskeletal:  Positive for back pain.  Skin: Negative.   Neurological: Negative.   Psychiatric/Behavioral:  Negative for depression. The patient is nervous/anxious.     All other ROS negative except what is listed above and in the HPI.      Objective:    BP 116/72 (BP Location: Left Arm, Patient Position: Sitting, Cuff Size: Normal)   Pulse 89   Temp 97.6 F (36.4 C)   Ht 5' 9 (1.753 m)   Wt 147 lb 3.2 oz (66.8 kg)   SpO2 99%   BMI 21.74 kg/m   Wt Readings from Last 3 Encounters:  11/05/23 147 lb 3.2 oz (66.8 kg)  09/11/23 141 lb (64 kg)  09/01/23 140 lb (63.5 kg)    Physical Exam Vitals and nursing note reviewed.  Constitutional:      General: She is not in acute distress.    Appearance: Normal appearance.  HENT:     Head: Normocephalic and atraumatic.     Right Ear: Tympanic membrane, ear canal and external ear normal.     Left Ear: Tympanic membrane, ear canal and external ear normal.     Mouth/Throat:     Mouth: Mucous membranes are moist.     Pharynx: No posterior oropharyngeal erythema.  Eyes:     Conjunctiva/sclera: Conjunctivae normal.  Cardiovascular:     Rate and Rhythm: Normal rate and regular rhythm.     Pulses: Normal pulses.     Heart sounds: Normal heart sounds.  Pulmonary:     Effort: Pulmonary effort is normal.     Breath sounds: Normal breath sounds.  Abdominal:     Palpations: Abdomen is soft.     Tenderness: There is no abdominal tenderness.  Musculoskeletal:        General: Normal range of motion.     Cervical back: Normal range of motion and neck supple.     Right lower leg: No edema.     Left lower leg: No edema.  Lymphadenopathy:     Cervical: No cervical adenopathy.  Skin:    General: Skin is warm and dry.  Neurological:     General: No focal deficit present.     Mental Status: She is alert and oriented to person, place, and time.     Cranial Nerves: No cranial nerve deficit.     Coordination: Coordination normal.     Gait: Gait normal.  Psychiatric:        Mood and Affect: Mood normal.        Speech: Speech is rapid and pressured.        Behavior: Behavior normal.        Thought Content:  Thought content normal.        Judgment: Judgment normal.     Results for orders placed or performed in visit on 09/01/23  VITAMIN D  25 Hydroxy (Vit-D Deficiency, Fractures)   Collection Time: 09/02/23  1:21 PM  Result Value Ref Range   Vit D, 25-Hydroxy 23.9 (L) 30.0 - 100.0 ng/mL  CBC with Differential/Platelet   Collection Time: 09/02/23  1:21 PM  Result Value Ref Range   WBC 7.5 3.4 -  10.8 x10E3/uL   RBC 3.96 3.77 - 5.28 x10E6/uL   Hemoglobin 13.0 11.1 - 15.9 g/dL   Hematocrit 61.4 65.9 - 46.6 %   MCV 97 79 - 97 fL   MCH 32.8 26.6 - 33.0 pg   MCHC 33.8 31.5 - 35.7 g/dL   RDW 88.1 88.2 - 84.5 %   Platelets 230 150 - 450 x10E3/uL   Neutrophils 49 Not Estab. %   Lymphs 29 Not Estab. %   Monocytes 9 Not Estab. %   Eos 12 Not Estab. %   Basos 1 Not Estab. %   Neutrophils Absolute 3.6 1.4 - 7.0 x10E3/uL   Lymphocytes Absolute 2.2 0.7 - 3.1 x10E3/uL   Monocytes Absolute 0.7 0.1 - 0.9 x10E3/uL   EOS (ABSOLUTE) 0.9 (H) 0.0 - 0.4 x10E3/uL   Basophils Absolute 0.1 0.0 - 0.2 x10E3/uL   Immature Granulocytes 0 Not Estab. %   Immature Grans (Abs) 0.0 0.0 - 0.1 x10E3/uL  Comprehensive metabolic panel with GFR   Collection Time: 09/02/23  1:21 PM  Result Value Ref Range   Glucose 87 70 - 99 mg/dL   BUN 18 6 - 24 mg/dL   Creatinine, Ser 9.33 0.57 - 1.00 mg/dL   eGFR 895 >40 fO/fpw/8.26   BUN/Creatinine Ratio 27 (H) 9 - 23   Sodium 140 134 - 144 mmol/L   Potassium 4.4 3.5 - 5.2 mmol/L   Chloride 101 96 - 106 mmol/L   CO2 23 20 - 29 mmol/L   Calcium  9.3 8.7 - 10.2 mg/dL   Total Protein 6.6 6.0 - 8.5 g/dL   Albumin  4.2 3.8 - 4.9 g/dL   Globulin, Total 2.4 1.5 - 4.5 g/dL   Bilirubin Total 0.3 0.0 - 1.2 mg/dL   Alkaline Phosphatase 107 44 - 121 IU/L   AST 16 0 - 40 IU/L   ALT 9 0 - 32 IU/L  TSH   Collection Time: 09/02/23  1:21 PM  Result Value Ref Range   TSH 1.590 0.450 - 4.500 uIU/mL  Protein electrophoresis, serum   Collection Time: 09/02/23  1:21 PM  Result Value Ref  Range   Albumin  ELP 3.8 2.9 - 4.4 g/dL   Alpha 1 0.2 0.0 - 0.4 g/dL   Alpha 2 0.8 0.4 - 1.0 g/dL   Beta 1.0 0.7 - 1.3 g/dL   Gamma Globulin 0.8 0.4 - 1.8 g/dL   M-Spike, % Not Observed Not Observed g/dL   Globulin, Total 2.8 2.2 - 3.9 g/dL   A/G Ratio 1.4 0.7 - 1.7   Please Note: Comment    Interpretation: Comment   Parathyroid  hormone, intact (no Ca)   Collection Time: 09/02/23  1:21 PM  Result Value Ref Range   PTH 40 15 - 65 pg/mL      Assessment & Plan:   Problem List Items Addressed This Visit       Musculoskeletal and Integument   Osteoporosis   Most recent DEXA scan showed T score -3.0. Prior 2 years ago was -2.6. Osteoporosis is worsening despite Evenity . Forteo initiation was discussed along with muscle building, protein intake, and weight-bearing exercises. Start Forteo after consulting with Dr. Cleatrice. Continue muscle building exercises and increase protein intake. Check vitamin D  levels today.        Other   Anxiety   Increased anxiety and irritability may be due to being out of Lexapro . Refill Lexapro  10mg  daily prescription.       Relevant Medications   escitalopram  (LEXAPRO ) 10 MG tablet  Vitamin D  deficiency   Vitamin D  supplementation is complete, check vitamin D  level today      Relevant Orders   VITAMIN D  25 Hydroxy (Vit-D Deficiency, Fractures)   Hyperlipidemia   Hyperlipidemia is present with previous non-compliance due to bone pain. Hereditary factors and lifestyle modifications were discussed. Restart rosuvastatin  20mg  with a modified dosing schedule of three times a week to minimize bone pain. Check CMP, CBC, lipid panel today. Follow-up in 3 months.       Relevant Orders   CBC with Differential/Platelet   Comprehensive metabolic panel with GFR   Lipid panel   Insomnia   Chronic insomnia involves difficulty falling asleep and frequent awakenings. Lyrica helps but causes drowsiness. Ambien  is effective but out of stock. Melatonin causes vivid  dreams at higher doses. Consider using Lyrica in the evening to aid sleep. Continue melatonin as needed, avoiding higher doses to prevent vivid dreams. Ambien  5mg  daily as needed at bedtime refilled. PDMP reviewed.       Relevant Medications   zolpidem  (AMBIEN ) 5 MG tablet   IDA (iron  deficiency anemia)   She has received some infusions for iron .  Will check CBC and iron  panel today.      Relevant Orders   CBC with Differential/Platelet   Iron    Ferritin   Routine general medical examination at a health care facility - Primary   Health maintenance reviewed and updated. Discussed nutrition, exercise. Follow-up 1 year.        Chronic midline low back pain without sciatica   Chronic, ongoing. Pain is likely due to spinal hardware and nerve compression. Continue Lyrica 50mg  daily as needed for pain management, especially in the evening. Continue physical therapy to manage pain and improve muscle support for bones.      Relevant Medications   celecoxib (CELEBREX) 100 MG capsule   pregabalin (LYRICA) 50 MG capsule   escitalopram  (LEXAPRO ) 10 MG tablet   Prediabetes   Check A1c today and treat based on results.       Relevant Orders   Hemoglobin A1c    Follow up plan: Return in about 1 year (around 11/04/2024) for CPE.   LABORATORY TESTING:  - Pap smear: up to date  IMMUNIZATIONS:   - Tdap: Tetanus vaccination status reviewed: last tetanus booster within 10 years. - Influenza: Declined - Pneumovax: Declined - Prevnar: Declined - HPV: Not applicable - Shingrix vaccine: Declined  SCREENING: -Mammogram: Done elsewhere  - Colonoscopy: Up to date  - Bone Density: Up to date   PATIENT COUNSELING:   Advised to take 1 mg of folate supplement per day if capable of pregnancy.   Sexuality: Discussed sexually transmitted diseases, partner selection, use of condoms, avoidance of unintended pregnancy  and contraceptive alternatives.   Advised to avoid cigarette smoking.  I  discussed with the patient that most people either abstain from alcohol or drink within safe limits (<=14/week and <=4 drinks/occasion for males, <=7/weeks and <= 3 drinks/occasion for females) and that the risk for alcohol disorders and other health effects rises proportionally with the number of drinks per week and how often a drinker exceeds daily limits.  Discussed cessation/primary prevention of drug use and availability of treatment for abuse.   Diet: Encouraged to adjust caloric intake to maintain  or achieve ideal body weight, to reduce intake of dietary saturated fat and total fat, to limit sodium intake by avoiding high sodium foods and not adding table salt, and to maintain adequate dietary potassium and  calcium  preferably from fresh fruits, vegetables, and low-fat dairy products.    stressed the importance of regular exercise  Injury prevention: Discussed safety belts, safety helmets, smoke detector, smoking near bedding or upholstery.   Dental health: Discussed importance of regular tooth brushing, flossing, and dental visits.    NEXT PREVENTATIVE PHYSICAL DUE IN 1 YEAR. Return in about 1 year (around 11/04/2024) for CPE.  Maripaz Mullan A Christon Parada

## 2023-11-05 NOTE — Assessment & Plan Note (Signed)
 Health maintenance reviewed and updated. Discussed nutrition, exercise. Follow-up 1 year.

## 2023-11-05 NOTE — Assessment & Plan Note (Signed)
 Vitamin D  supplementation is complete, check vitamin D  level today

## 2023-11-05 NOTE — Assessment & Plan Note (Signed)
 Chronic, ongoing. Pain is likely due to spinal hardware and nerve compression. Continue Lyrica 50mg  daily as needed for pain management, especially in the evening. Continue physical therapy to manage pain and improve muscle support for bones.

## 2023-11-11 ENCOUNTER — Other Ambulatory Visit (HOSPITAL_COMMUNITY): Payer: Self-pay

## 2023-11-11 ENCOUNTER — Other Ambulatory Visit: Payer: Self-pay

## 2023-11-11 ENCOUNTER — Other Ambulatory Visit: Payer: Self-pay | Admitting: Pharmacy Technician

## 2023-11-11 ENCOUNTER — Other Ambulatory Visit: Payer: Self-pay | Admitting: *Deleted

## 2023-11-11 MED ORDER — TERIPARATIDE 560 MCG/2.24ML ~~LOC~~ SOPN
20.0000 ug | PEN_INJECTOR | Freq: Every day | SUBCUTANEOUS | 11 refills | Status: AC
  Filled 2023-11-11: qty 2.4, fill #0
  Filled 2023-11-12: qty 2.24, 30d supply, fill #0
  Filled 2023-11-25: qty 2.24, 28d supply, fill #0
  Filled 2023-12-10: qty 2.24, 28d supply, fill #1
  Filled 2024-01-01: qty 4.48, 56d supply, fill #2
  Filled 2024-01-01: qty 2.24, 28d supply, fill #2
  Filled 2024-01-26 – 2024-02-04 (×2): qty 2.24, 28d supply, fill #3
  Filled 2024-03-02: qty 2.24, 28d supply, fill #4

## 2023-11-12 ENCOUNTER — Other Ambulatory Visit: Payer: Self-pay

## 2023-11-12 ENCOUNTER — Encounter: Payer: Self-pay | Admitting: Family

## 2023-11-12 ENCOUNTER — Telehealth: Payer: Self-pay

## 2023-11-12 NOTE — Telephone Encounter (Signed)
 Pharmacy Patient Advocate Encounter   Received notification from Patient Pharmacy that prior authorization for Forteo  is required/requested.   Insurance verification completed.   The patient is insured through [employer] .   Per test claim: PA required; PA submitted to above mentioned insurance via Latent Key/confirmation #/EOC [id number] Status is pending

## 2023-11-12 NOTE — Progress Notes (Signed)
 Pharmacy Patient Advocate Encounter  Insurance verification completed.   The patient is insured through [employer]   Ran test claim for Forteo . PA required.  This test claim was processed through [facility]- copay amounts may vary at other pharmacies due to pharmacy/plan contracts, or as the patient moves through the different stages of their insurance plan.

## 2023-11-14 ENCOUNTER — Other Ambulatory Visit: Payer: Self-pay

## 2023-11-14 NOTE — Telephone Encounter (Signed)
 Pharmacy Patient Advocate Encounter  Received notification from [employer] that Prior Authorization for Forteo  has been DENIED.      PA #/Case ID/Reference #: [id number]

## 2023-11-17 ENCOUNTER — Other Ambulatory Visit: Payer: Self-pay

## 2023-11-18 ENCOUNTER — Other Ambulatory Visit: Payer: Self-pay | Admitting: *Deleted

## 2023-11-18 ENCOUNTER — Other Ambulatory Visit (HOSPITAL_COMMUNITY): Payer: Self-pay

## 2023-11-18 MED ORDER — JUBBONTI 60 MG/ML ~~LOC~~ SOSY
60.0000 mg | PREFILLED_SYRINGE | SUBCUTANEOUS | 0 refills | Status: DC
  Filled 2023-11-18: qty 1, fill #0
  Filled 2023-11-19: qty 1, 180d supply, fill #0

## 2023-11-19 ENCOUNTER — Other Ambulatory Visit: Payer: Self-pay

## 2023-11-19 ENCOUNTER — Other Ambulatory Visit: Payer: Self-pay | Admitting: Pharmacy Technician

## 2023-11-20 ENCOUNTER — Other Ambulatory Visit: Payer: Self-pay

## 2023-11-24 ENCOUNTER — Other Ambulatory Visit: Payer: Self-pay

## 2023-11-24 ENCOUNTER — Other Ambulatory Visit (HOSPITAL_COMMUNITY): Payer: Self-pay

## 2023-11-24 ENCOUNTER — Other Ambulatory Visit: Payer: Self-pay | Admitting: *Deleted

## 2023-11-24 MED ORDER — ASSURE ID DUO PRO PEN NEEDLES 31G X 5 MM MISC
1.0000 | Freq: Every day | 24 refills | Status: AC
  Filled 2023-11-24: qty 100, 34d supply, fill #0
  Filled 2024-03-04: qty 100, 34d supply, fill #1

## 2023-11-24 NOTE — Progress Notes (Signed)
 Pharmacy Patient Advocate Encounter  Insurance verification completed.   The patient is insured through [employer]   Ran test claim for Forteo . Co-pay is $4.  This test claim was processed through [facility]- copay amounts may vary at other pharmacies due to pharmacy/plan contracts, or as the patient moves through the different stages of their insurance plan.

## 2023-11-24 NOTE — Telephone Encounter (Signed)
 Patient has contraindications to preferred medications due to her needing a lot of dental work.   Received notification from [employer] that Prior Authorization for Forteo  has been APPROVED from 11/24/23 to 11/23/24   PA #/Case ID/Reference #: [id number]

## 2023-11-25 ENCOUNTER — Other Ambulatory Visit: Payer: Self-pay

## 2023-11-25 NOTE — Progress Notes (Signed)
 Specialty Pharmacy Initial Fill Coordination Note  Roberta Sims is a 53 y.o. female contacted today regarding initial fill of specialty medication(s) Teriparatide    Patient requested [name] at [facility] at [place] date: 11/25/23   Medication will be filled on 9/30.   Patient is aware of $0 copayment.

## 2023-11-25 NOTE — Progress Notes (Signed)
 Specialty Pharmacy Initiation Note   Roberta Sims is a 53 y.o. female who will be followed by the specialty pharmacy service for RxSp Osteoporosis    Review of administration, indication, effectiveness, safety, potential side effects, storage/disposable, and missed dose instructions occurred today for patient's specialty medication(s) Teriparatide      Patient/Caregiver did not have any additional questions or concerns.   Patient's therapy is appropriate to: Initiate    Goals Addressed             This Visit's Progress    Prevent or reduce bone loss       Patient is initiating therapy. Patient will maintain adherence and adhere to provider and/or lab appointments         Delon CHRISTELLA Brow Specialty Pharmacist

## 2023-12-01 ENCOUNTER — Ambulatory Visit: Admitting: Family Medicine

## 2023-12-01 VITALS — BP 122/78 | Ht 67.0 in | Wt 138.0 lb

## 2023-12-01 DIAGNOSIS — M810 Age-related osteoporosis without current pathological fracture: Secondary | ICD-10-CM

## 2023-12-01 NOTE — Progress Notes (Signed)
 Patient came into the office today to review how to administer her forteo .

## 2023-12-04 ENCOUNTER — Telehealth: Payer: Self-pay | Admitting: *Deleted

## 2023-12-04 ENCOUNTER — Telehealth: Payer: Self-pay

## 2023-12-04 DIAGNOSIS — G8929 Other chronic pain: Secondary | ICD-10-CM | POA: Diagnosis not present

## 2023-12-04 DIAGNOSIS — R29898 Other symptoms and signs involving the musculoskeletal system: Secondary | ICD-10-CM | POA: Diagnosis not present

## 2023-12-04 DIAGNOSIS — M5416 Radiculopathy, lumbar region: Secondary | ICD-10-CM | POA: Diagnosis not present

## 2023-12-04 DIAGNOSIS — M5440 Lumbago with sciatica, unspecified side: Secondary | ICD-10-CM | POA: Diagnosis not present

## 2023-12-04 NOTE — Telephone Encounter (Signed)
 Note created to carry forteo  during air travel

## 2023-12-04 NOTE — Telephone Encounter (Signed)
 Patient called office due to her traveling Monday, and the airline would like her to have a written prescription for her shots, patient states that she can come by office to get today or tomorrow, thanks.

## 2023-12-05 ENCOUNTER — Encounter (HOSPITAL_BASED_OUTPATIENT_CLINIC_OR_DEPARTMENT_OTHER): Payer: Self-pay

## 2023-12-05 ENCOUNTER — Emergency Department (HOSPITAL_BASED_OUTPATIENT_CLINIC_OR_DEPARTMENT_OTHER): Admission: EM | Admit: 2023-12-05 | Discharge: 2023-12-05 | Disposition: A | Discharge status: Home / Self Care

## 2023-12-05 ENCOUNTER — Emergency Department (HOSPITAL_BASED_OUTPATIENT_CLINIC_OR_DEPARTMENT_OTHER)

## 2023-12-05 ENCOUNTER — Other Ambulatory Visit: Payer: Self-pay

## 2023-12-05 DIAGNOSIS — S299XXA Unspecified injury of thorax, initial encounter: Secondary | ICD-10-CM | POA: Diagnosis present

## 2023-12-05 DIAGNOSIS — S20212A Contusion of left front wall of thorax, initial encounter: Secondary | ICD-10-CM | POA: Diagnosis not present

## 2023-12-05 DIAGNOSIS — W228XXA Striking against or struck by other objects, initial encounter: Secondary | ICD-10-CM | POA: Insufficient documentation

## 2023-12-05 DIAGNOSIS — S298XXA Other specified injuries of thorax, initial encounter: Secondary | ICD-10-CM

## 2023-12-05 MED ORDER — CELECOXIB 100 MG PO CAPS: 100.0000 mg | ORAL_CAPSULE | Freq: Two times a day (BID) | ORAL | 0 refills | Status: AC

## 2023-12-05 MED ORDER — OXYCODONE-ACETAMINOPHEN 5-325 MG PO TABS: 1.0000 | ORAL_TABLET | Freq: Four times a day (QID) | ORAL | 0 refills | Status: DC | PRN
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
## 2023-12-05 NOTE — Discharge Instructions (Signed)
 Your chest x-ray did not show any collapsed lung or significantly displaced rib fracture but I suspect you probably do have a rib fracture.  Please take the Celebrex as prescribed.  If you are still having pain is okay to take the Percocet.  Do not drive or drink alcohol while taking the Percocet as it may make you drowsy.  Do not take this with Ambien  or other sleep aids as it may make you drowsy.  Follow-up with your doctor and return to the ER for worsening symptoms.

## 2023-12-10 ENCOUNTER — Other Ambulatory Visit: Payer: Self-pay

## 2023-12-10 ENCOUNTER — Other Ambulatory Visit (HOSPITAL_COMMUNITY): Payer: Self-pay

## 2023-12-10 NOTE — Progress Notes (Signed)
 Specialty Pharmacy Refill Coordination Note  Roberta Sims is a 53 y.o. female contacted today regarding refills of specialty medication(s) Teriparatide    Patient requested [name] at [facility] at [place] date: 12/12/23   Medication will be filled on 12/11/23.

## 2023-12-11 ENCOUNTER — Other Ambulatory Visit: Payer: Self-pay

## 2023-12-18 ENCOUNTER — Encounter: Payer: Self-pay | Admitting: Family

## 2023-12-25 ENCOUNTER — Ambulatory Visit (INDEPENDENT_AMBULATORY_CARE_PROVIDER_SITE_OTHER): Admitting: Neurology

## 2023-12-25 ENCOUNTER — Telehealth: Payer: Self-pay

## 2023-12-25 ENCOUNTER — Encounter: Payer: Self-pay | Admitting: Neurology

## 2023-12-25 VITALS — BP 121/83 | HR 84 | Ht 67.0 in | Wt 149.0 lb

## 2023-12-25 DIAGNOSIS — G4733 Obstructive sleep apnea (adult) (pediatric): Secondary | ICD-10-CM | POA: Diagnosis not present

## 2023-12-25 NOTE — Telephone Encounter (Signed)
 [facility] SENT TO DME DME: [facility] [phone] Fax:[phone]
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
## 2023-12-25 NOTE — Progress Notes (Signed)
 Message to [facility] for new machine sent.

## 2023-12-25 NOTE — Patient Instructions (Signed)
 Great to see you today!  - Restart CPAP. Order to DME - Will continue prior settings 6-12 cmH2O, EPR 3, nasal pillow mask - Recommend nightly usage minimum 4 hours - After restarting CPAP for 1 month, reach out via my chart and I can pull the data - Follow-up in 6 months

## 2023-12-29 NOTE — Telephone Encounter (Signed)
 RE: new cpap machine Received: 3 days ago Zott, Glade Salt, Nena RAMAN, RN; [place], [place]; Darrel Boyer pt has a cpap she received from us  2023, order is to restart her cpap and supplies, Order pulled Thank you     Previous Messages    ----- Message ----- From: Salt Nena RAMAN, RN Sent: 12/25/2023   3:54 PM EDT To: Madelin Donnice Boyer Darrel; Stacy Zott Subject: new cpap machine                              New order in epic for new machine  Roberta D. [facility] Female, 53 y.o., 05-Oct-1970 MRN: [id number] Phone: [phone] (M)   Thank you,  Thurmon

## 2024-01-01 ENCOUNTER — Other Ambulatory Visit: Payer: Self-pay

## 2024-01-01 NOTE — Progress Notes (Signed)
 Specialty Pharmacy Refill Coordination Note  Roberta Sims is a 53 y.o. female contacted today regarding refills of specialty medication(s) Teriparatide    Patient requested [name] at [facility] at [place] date: 01/02/24   Medication will be filled on: 01/02/24

## 2024-01-05 ENCOUNTER — Other Ambulatory Visit: Payer: Self-pay

## 2024-01-05 NOTE — Progress Notes (Signed)
 Clinical Intervention Note  Clinical Intervention Notes: During refill outreach, patient reported that she was experiencing headaches in the morning (injects Forteo  at night). After speaking with patient, she is unsure if it is related to the Forteo , allergies, or also starting Crestor  at night. Patient had also recently returned from out of state trip. I advised patient that headache isn't a listed side effect of Forteo , but the combination of all of those may be inducing the headache. Patient said it has been getting better over the last day or so and will continue to monitor how often it occurs. Explained to patient that if it does not get better, or gets worse, to contact provider. Patient would like to push through and continue the Forteo  despiste the headache.   Clinical Intervention Outcomes: Prevention of an adverse drug event   Advertising Account Planner

## 2024-01-26 ENCOUNTER — Other Ambulatory Visit: Payer: Self-pay

## 2024-01-28 ENCOUNTER — Ambulatory Visit: Admitting: Neurology

## 2024-01-28 ENCOUNTER — Other Ambulatory Visit: Payer: Self-pay

## 2024-01-29 ENCOUNTER — Encounter: Payer: Self-pay | Admitting: Family Medicine

## 2024-01-30 ENCOUNTER — Other Ambulatory Visit: Payer: Self-pay

## 2024-02-04 ENCOUNTER — Other Ambulatory Visit: Payer: Self-pay

## 2024-02-04 ENCOUNTER — Other Ambulatory Visit (HOSPITAL_COMMUNITY): Payer: Self-pay

## 2024-02-04 NOTE — Progress Notes (Signed)
 Specialty Pharmacy Refill Coordination Note  Roberta Sims is a 53 y.o. female contacted today regarding refills of specialty medication(s) Teriparatide    Patient requested [name] at [facility] at [place] date: 02/08/24   Medication will be filled on: 02/06/24

## 2024-02-05 ENCOUNTER — Other Ambulatory Visit: Payer: Self-pay

## 2024-02-13 ENCOUNTER — Other Ambulatory Visit: Payer: Self-pay

## 2024-02-16 ENCOUNTER — Other Ambulatory Visit: Payer: Self-pay

## 2024-02-16 ENCOUNTER — Other Ambulatory Visit (HOSPITAL_COMMUNITY): Payer: Self-pay

## 2024-03-02 ENCOUNTER — Other Ambulatory Visit: Payer: Self-pay

## 2024-03-04 ENCOUNTER — Other Ambulatory Visit (HOSPITAL_COMMUNITY): Payer: Self-pay

## 2024-03-04 NOTE — Progress Notes (Signed)
 Specialty Pharmacy Refill Coordination Note  Roberta Sims is a 54 y.o. female contacted today regarding refills of specialty medication(s) Teriparatide    Patient requested [name] at [facility] at [place] date: 03/19/24   Medication will be filled on: 03/18/24

## 2024-03-05 ENCOUNTER — Telehealth: Payer: Self-pay | Admitting: *Deleted

## 2024-03-05 DIAGNOSIS — E785 Hyperlipidemia, unspecified: Secondary | ICD-10-CM

## 2024-03-05 NOTE — Progress Notes (Unsigned)
 Complex Care Management Note Care Guide Note  03/05/2024 Name: Roberta Sims MRN: [id number] DOB: 11-04-1970   Complex Care Management Outreach Attempts: An unsuccessful telephone outreach was attempted today to offer the patient information about available complex care management services.  Follow Up Plan:  Additional outreach attempts will be made to offer the patient complex care management information and services.   Encounter Outcome:  No Answer  Harlene Satterfield  [facility]  [facility], [facility] Guide  Direct Dial: [phone]  Fax [phone]

## 2024-03-08 NOTE — Progress Notes (Unsigned)
 Complex Care Management Note Care Guide Note  03/08/2024 Name: Roberta Sims MRN: [id number] DOB: Dec 27, 1970   Complex Care Management Outreach Attempts: A second unsuccessful outreach was attempted today to offer the patient with information about available complex care management services.  Follow Up Plan:  Additional outreach attempts will be made to offer the patient complex care management information and services.   Encounter Outcome:  No Answer  Harlene Satterfield  [facility]  [facility] Guide  Direct Dial: [phone]  Fax [phone]

## 2024-03-09 NOTE — Progress Notes (Signed)
 Complex Care Management Note Care Guide Note  03/09/2024 Name: [name] MRN: [id number] DOB: 03-Jul-1970   Complex Care Management Outreach Attempts: A third unsuccessful outreach was attempted today to offer the patient with information about available complex care management services.  Follow Up Plan:  No further outreach attempts will be made at this time. We have been unable to contact the patient to offer or enroll patient in complex care management services.  Encounter Outcome:  No Answer  Harlene Satterfield  [facility]  [facility], [facility] Guide  Direct Dial: [phone]  Fax [phone]

## 2024-03-18 ENCOUNTER — Other Ambulatory Visit: Payer: Self-pay

## 2024-03-19 ENCOUNTER — Other Ambulatory Visit: Payer: Self-pay

## 2024-03-19 ENCOUNTER — Other Ambulatory Visit (HOSPITAL_COMMUNITY): Payer: Self-pay

## 2024-03-24 ENCOUNTER — Telehealth: Payer: Self-pay | Admitting: Neurology

## 2024-03-24 NOTE — Telephone Encounter (Signed)
 MYC cxl

## 2024-04-01 ENCOUNTER — Ambulatory Visit: Admitting: Nurse Practitioner

## 2024-04-01 ENCOUNTER — Encounter: Payer: Self-pay | Admitting: Nurse Practitioner

## 2024-04-01 VITALS — BP 116/72 | HR 81 | Temp 97.8°F | Ht 67.0 in | Wt 150.6 lb

## 2024-04-01 DIAGNOSIS — J0110 Acute frontal sinusitis, unspecified: Secondary | ICD-10-CM

## 2024-04-01 DIAGNOSIS — G4700 Insomnia, unspecified: Secondary | ICD-10-CM

## 2024-04-01 MED ORDER — AMOXICILLIN-POT CLAVULANATE 875-125 MG PO TABS: 1.0000 | ORAL_TABLET | Freq: Two times a day (BID) | ORAL | 0 refills | Status: AC

## 2024-04-01 MED ORDER — PREDNISONE 20 MG PO TABS: 40.0000 mg | ORAL_TABLET | Freq: Every day | ORAL | 0 refills | Status: AC

## 2024-04-01 MED ORDER — FLUCONAZOLE 150 MG PO TABS: 150.0000 mg | ORAL_TABLET | Freq: Once | ORAL | 0 refills | Status: AC

## 2024-04-01 MED ORDER — ZOLPIDEM TARTRATE 5 MG PO TABS: 5.0000 mg | ORAL_TABLET | Freq: Every evening | ORAL | 2 refills | Status: AC | PRN

## 2024-04-01 NOTE — Assessment & Plan Note (Signed)
 Chronic, stable. Continue ambien  5mg  daily as needed. PDMP reviewed.

## 2024-04-01 NOTE — Patient Instructions (Signed)
 It was great to see you!  Keep taking drinking plenty of fluids and doing the nasal rinses  Start Augmentin  twice a day for 10 days  Start prednisone  2 tablets daily in the morning with food   Take diflucan  after antibiotics to prevent yeast   Let's follow-up if symptoms worsen or don't improve   Take care,  Tinnie Harada, NP

## 2024-04-01 NOTE — Progress Notes (Signed)
 "  Acute Visit  BP 116/72 (BP Location: Left Arm, Patient Position: Sitting, Cuff Size: Normal)   Pulse 81   Temp 97.8 F (36.6 C) (Oral)   Ht 5' 7 (1.702 m)   Wt 150 lb 9.6 oz (68.3 kg)   SpO2 97%   BMI 23.59 kg/m    Subjective:    Patient ID: Roberta Sims, female    DOB: November 28, 1970, 54 y.o.   MRN: [id number]  CC: Chief Complaint  Patient presents with   Sinusitis    Sinus drainage, sinus pressure, left ear pain, left side of throat hurts  for 2 weeks, Rx refill of Ambien     HPI: Roberta Sims is a 54 y.o. female presents for sinus symptoms  Discussed the use of AI scribe software for clinical note transcription with the patient, who gave verbal consent to proceed.  Her symptoms began about two weeks ago with a cold that initially improved, then worsened again. She has similar episodes about once or twice a year.  Symptoms are mainly on the left side of her face, with left ear and throat pain, sinus pressure, and left-sided headaches. Her right ear often pops while the left ear hurts.  She has no current fever. She uses nasal rinses twice daily and takes Zyrtec daily. She has had a severe cough for three days that has improved, and did not use cough medicine, focusing on drainage to reduce coughing.      Past Medical History:  Diagnosis Date   Allergy    Seasonal   Anemia    Anxiety    Arthritis    Knees   Blood transfusion without reported diagnosis 1985   Complication of anesthesia    Compression fracture of thoracic spine, non-traumatic, sequela    Dysphagia    GERD with stricture 03/04/2019   Hx of sessile serrated colonic polyp 06/28/2013   Hyperlipidemia    Osteoporosis    PONV (postoperative nausea and vomiting)    Scoliosis    Sleep apnea 2016   need mew study to update info and get new script   Uterine prolapse     Past Surgical History:  Procedure Laterality Date   ABDOMINAL HYSTERECTOMY     COLONOSCOPY     ESOPHAGOGASTRODUODENOSCOPY      harrington rod--scoliosis     PARTIAL HYSTERECTOMY     SPINE SURGERY  1985   Scoliosis surgery   UPPER GI ENDOSCOPY N/A 12/30/2022   Procedure: UPPER GI ENDOSCOPY;  Surgeon: Tanda Locus, MD;  Location: [facility];  Service: General;  Laterality: N/A;   XI ROBOTIC ASSISTED HIATAL HERNIA REPAIR N/A 12/30/2022   Procedure: ROBOTIC REPAIR LARGE HIATAL HERNIA WITH WRAP;  Surgeon: Tanda Locus, MD;  Location: [facility];  Service: General;  Laterality: N/A;  210    Family History  Problem Relation Age of Onset   Pancreatic cancer Mother        metastasized to colon   Arthritis Mother    Cancer Mother    Varicose Veins Mother    Alcohol abuse Brother    Diabetes Brother    Cancer Maternal Aunt        breast   Colon cancer Maternal Grandmother    Arthritis Maternal Grandmother    Cancer Maternal Grandmother    Vision loss Maternal Grandmother    Hearing loss Maternal Grandfather    Stroke Maternal Grandfather    Asthma Son      Social History[1]  Medications Ordered Prior  to Encounter[2]   Review of Systems See pertinent positives and negatives per HPI.     Objective:    BP 116/72 (BP Location: Left Arm, Patient Position: Sitting, Cuff Size: Normal)   Pulse 81   Temp 97.8 F (36.6 C) (Oral)   Ht 5' 7 (1.702 m)   Wt 150 lb 9.6 oz (68.3 kg)   SpO2 97%   BMI 23.59 kg/m   Wt Readings from Last 3 Encounters:  04/01/24 150 lb 9.6 oz (68.3 kg)  12/25/23 149 lb (67.6 kg)  12/05/23 140 lb (63.5 kg)    BP Readings from Last 3 Encounters:  04/01/24 116/72  12/25/23 121/83  12/05/23 129/86    Physical Exam Vitals and nursing note reviewed.  Constitutional:      General: She is not in acute distress.    Appearance: Normal appearance.  HENT:     Head: Normocephalic.     Right Ear: Ear canal and external ear normal. A middle ear effusion is present.     Left Ear: Ear canal and external ear normal. A middle ear effusion is present.     Nose:     Right Sinus: Frontal sinus  tenderness present. No maxillary sinus tenderness.     Left Sinus: Frontal sinus tenderness present. No maxillary sinus tenderness.     Mouth/Throat:     Mouth: Mucous membranes are moist.     Pharynx: Posterior oropharyngeal erythema present. No oropharyngeal exudate.  Eyes:     Conjunctiva/sclera: Conjunctivae normal.  Cardiovascular:     Rate and Rhythm: Normal rate and regular rhythm.     Pulses: Normal pulses.     Heart sounds: Normal heart sounds.  Pulmonary:     Effort: Pulmonary effort is normal.     Breath sounds: Normal breath sounds.  Musculoskeletal:     Cervical back: Normal range of motion and neck supple. Tenderness (left lateral neck) present.  Lymphadenopathy:     Cervical: No cervical adenopathy.  Skin:    General: Skin is warm.  Neurological:     General: No focal deficit present.     Mental Status: She is alert and oriented to person, place, and time.  Psychiatric:        Mood and Affect: Mood normal.        Behavior: Behavior normal.        Thought Content: Thought content normal.        Judgment: Judgment normal.        Assessment & Plan:   Problem List Items Addressed This Visit       Other   Insomnia   Chronic, stable. Continue ambien  5mg  daily as needed. PDMP reviewed.       Relevant Medications   zolpidem  (AMBIEN ) 5 MG tablet   Other Visit Diagnoses       Acute non-recurrent frontal sinusitis    -  Primary   Symptoms x2 weeks. Start augmentin  BID x10 days and prednisone  40mg  daily x5. Continue nasal rinses and OTC meds.   Relevant Medications   amoxicillin -clavulanate (AUGMENTIN ) 875-125 MG tablet   predniSONE  (DELTASONE ) 20 MG tablet   fluconazole  (DIFLUCAN ) 150 MG tablet        Follow up plan: Return if symptoms worsen or fail to improve.  Tinnie DELENA Harada, NP  I,Emily Lagle,acting as a scribe for [employer], NP.,have documented all relevant documentation on the behalf of Swan Zayed DELENA Harada, NP.  I, Tinnie DELENA Harada, NP,  have reviewed all  documentation for this visit. The documentation on 04/01/2024 for the exam, diagnosis, procedures, and orders are all accurate and complete.     [1]  Social History Tobacco Use   Smoking status: Never   Smokeless tobacco: Never  Vaping Use   Vaping status: Never Used  Substance Use Topics   Alcohol use: Yes    Comment: socially   Drug use: Never  [2]  Current Outpatient Medications on File Prior to Visit  Medication Sig Dispense Refill   Ascorbic Acid (VITAMIN C) 100 MG tablet Take 100 mg by mouth daily.     celecoxib  (CELEBREX ) 100 MG capsule Take 1 capsule (100 mg total) by mouth 2 (two) times daily. 30 capsule 0   cetirizine (ZYRTEC) 10 MG tablet Take 10 mg by mouth daily.     cyclobenzaprine  (FLEXERIL ) 10 MG tablet Take 1 tablet (10 mg total) by mouth 3 (three) times daily as needed for muscle spasms. 90 tablet 1   escitalopram  (LEXAPRO ) 10 MG tablet Take 1 tablet (10 mg total) by mouth daily. 90 tablet 1   estrogens , conjugated, (PREMARIN ) 0.3 MG tablet Take 0.3 mg by mouth.     Insulin  Pen Needle (ASSURE ID DUO PRO PEN NEEDLES) 31G X 5 MM MISC Use daily as directed 30 each 24   NON FORMULARY Cpap machine nightly     pregabalin (LYRICA) 50 MG capsule Take 50 mg by mouth daily.     rosuvastatin  (CRESTOR ) 20 MG tablet Take 1 tablet (20 mg total) by mouth daily. 90 tablet 3   Teriparatide  (FORTEO ) 560 MCG/2.24ML SOPN Inject 20 mcg into the skin daily. 2.4 mL 11   zinc gluconate 50 MG tablet Take 50 mg by mouth as needed.     No current facility-administered medications on file prior to visit.   "

## 2024-07-22 ENCOUNTER — Ambulatory Visit: Admitting: Neurology
